# Patient Record
Sex: Female | Born: 1995
Health system: Southern US, Community
[De-identification: ages and names within clinical notes are randomized; demographics above are authoritative.]

## PROBLEM LIST (undated history)

## (undated) ENCOUNTER — Emergency Department (HOSPITAL_COMMUNITY): Admission: EM | Payer: BLUE CROSS/BLUE SHIELD | Source: Home / Self Care

## (undated) DIAGNOSIS — Z30017 Encounter for initial prescription of implantable subdermal contraceptive: Secondary | ICD-10-CM

## (undated) DIAGNOSIS — G43909 Migraine, unspecified, not intractable, without status migrainosus: Secondary | ICD-10-CM

## (undated) DIAGNOSIS — Z3009 Encounter for other general counseling and advice on contraception: Secondary | ICD-10-CM

## (undated) DIAGNOSIS — A749 Chlamydial infection, unspecified: Secondary | ICD-10-CM

## (undated) DIAGNOSIS — B9689 Other specified bacterial agents as the cause of diseases classified elsewhere: Secondary | ICD-10-CM

## (undated) DIAGNOSIS — N760 Acute vaginitis: Secondary | ICD-10-CM

## (undated) DIAGNOSIS — A549 Gonococcal infection, unspecified: Secondary | ICD-10-CM

## (undated) HISTORY — DX: Other specified bacterial agents as the cause of diseases classified elsewhere: B96.89

## (undated) HISTORY — DX: Chlamydial infection, unspecified: A74.9

## (undated) HISTORY — DX: Encounter for other general counseling and advice on contraception: Z30.09

## (undated) HISTORY — DX: Acute vaginitis: N76.0

## (undated) HISTORY — DX: Gonococcal infection, unspecified: A54.9

## (undated) HISTORY — DX: Migraine, unspecified, not intractable, without status migrainosus: G43.909

## (undated) HISTORY — DX: Encounter for initial prescription of implantable subdermal contraceptive: Z30.017

## (undated) HISTORY — PX: NO PAST SURGERIES: SHX2092

---
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
## 2014-01-17 ENCOUNTER — Ambulatory Visit: Payer: BC Managed Care – PPO | Admitting: Nurse Practitioner

## 2014-01-19 ENCOUNTER — Encounter (HOSPITAL_COMMUNITY): Payer: Self-pay | Admitting: Emergency Medicine

## 2014-01-19 ENCOUNTER — Emergency Department (HOSPITAL_COMMUNITY)
Admission: EM | Admit: 2014-01-19 | Discharge: 2014-01-20 | Disposition: A | Discharge status: Home / Self Care | Payer: BC Managed Care – PPO | Attending: Emergency Medicine | Admitting: Emergency Medicine

## 2014-01-19 DIAGNOSIS — N72 Inflammatory disease of cervix uteri: Secondary | ICD-10-CM | POA: Insufficient documentation

## 2014-01-19 DIAGNOSIS — N390 Urinary tract infection, site not specified: Secondary | ICD-10-CM | POA: Insufficient documentation

## 2014-01-19 DIAGNOSIS — Z3202 Encounter for pregnancy test, result negative: Secondary | ICD-10-CM | POA: Insufficient documentation

## 2014-01-19 DIAGNOSIS — B3731 Acute candidiasis of vulva and vagina: Secondary | ICD-10-CM | POA: Insufficient documentation

## 2014-01-19 DIAGNOSIS — B373 Candidiasis of vulva and vagina: Secondary | ICD-10-CM | POA: Insufficient documentation

## 2014-01-19 LAB — WET PREP, GENITAL
Clue Cells Wet Prep HPF POC: NONE SEEN
Trich, Wet Prep: NONE SEEN
Yeast Wet Prep HPF POC: NONE SEEN

## 2014-01-19 LAB — URINALYSIS, ROUTINE W REFLEX MICROSCOPIC
Bilirubin Urine: NEGATIVE
Glucose, UA: NEGATIVE mg/dL
HGB URINE DIPSTICK: NEGATIVE
Ketones, ur: NEGATIVE mg/dL
NITRITE: NEGATIVE
Protein, ur: NEGATIVE mg/dL
SPECIFIC GRAVITY, URINE: 1.029 (ref 1.005–1.030)
UROBILINOGEN UA: 1 mg/dL (ref 0.0–1.0)
pH: 5.5 (ref 5.0–8.0)

## 2014-01-19 LAB — URINE MICROSCOPIC-ADD ON

## 2014-01-19 LAB — POC URINE PREG, ED: Preg Test, Ur: NEGATIVE

## 2014-01-19 MED ORDER — FLUCONAZOLE 150 MG PO TABS
150.0000 mg | ORAL_TABLET | Freq: Once | ORAL | Status: AC
  Administered 2014-01-19: 150 mg via ORAL
  Filled 2014-01-19: qty 1

## 2014-01-19 MED ORDER — AZITHROMYCIN 250 MG PO TABS
1000.0000 mg | ORAL_TABLET | Freq: Once | ORAL | Status: AC
  Administered 2014-01-19: 1000 mg via ORAL
  Filled 2014-01-19: qty 4

## 2014-01-19 MED ORDER — CEFTRIAXONE SODIUM 250 MG IJ SOLR
250.0000 mg | Freq: Once | INTRAMUSCULAR | Status: AC
  Administered 2014-01-19: 250 mg via INTRAMUSCULAR
  Filled 2014-01-19: qty 250

## 2014-01-19 NOTE — ED Provider Notes (Signed)
CSN: [id number]     Arrival date & time 01/19/14  2042 History   First MD Initiated Contact with Patient 01/19/14 2236     Chief Complaint  Patient presents with  . Vaginal Itching     (Consider location/radiation/quality/duration/timing/severity/associated sxs/prior Treatment) HPI Lauren Sherman is a 18 y.o. female who presents to ED with complaint of vaginal itching. States itching began  2 wks ago. States just prior to that had bacterial vaginosis and was treated with an antibiotic. States having vulva irritation, itching, white discharge. States tried [employer]  "eggs" with no relief. Denies urinary symptoms. She is sexually active with one partner. Denies fever, chills, abdominal pain, back pain.   History reviewed. No pertinent past medical history. History reviewed. No pertinent past surgical history. Family History  Problem Relation Age of Onset  . Cancer Other    History  Substance Use Topics  . Smoking status: Never Smoker   . Smokeless tobacco: Not on file  . Alcohol Use: No   OB History   Grav Para Term Preterm Abortions TAB SAB Ect Mult Living                 Review of Systems  Constitutional: Negative for fever and chills.  Respiratory: Negative for cough, chest tightness and shortness of breath.   Cardiovascular: Negative for chest pain, palpitations and leg swelling.  Gastrointestinal: Negative for nausea, vomiting, abdominal pain and diarrhea.  Genitourinary: Positive for vaginal discharge. Negative for dysuria, flank pain, vaginal bleeding, vaginal pain and pelvic pain.  Musculoskeletal: Negative for arthralgias, myalgias, neck pain and neck stiffness.  Skin: Negative for rash.  Neurological: Negative for dizziness, weakness and headaches.  All other systems reviewed and are negative.      Allergies  Cherry and Peanut-containing drug products  Home Medications   Current Outpatient Rx  Name  Route  Sig  Dispense  Refill  . albuterol (PROVENTIL  HFA;VENTOLIN HFA) 108 (90 BASE) MCG/ACT inhaler   Inhalation   Inhale 2 puffs into the lungs every 6 (six) hours as needed for wheezing.   1 Inhaler   2    BP 129/78  Pulse 67  Temp(Src) 98.9 F (37.2 C) (Oral)  Resp 16  Ht 5\' 3"  (1.6 m)  Wt 170 lb 5 oz (77.253 kg)  BMI 30.18 kg/m2  SpO2 98%  LMP 12/22/2013 Physical Exam  Nursing note and vitals reviewed. Constitutional: She appears well-developed and well-nourished. No distress.  HENT:  Head: Normocephalic.  Eyes: Conjunctivae are normal.  Neck: Neck supple.  Cardiovascular: Normal rate, regular rhythm and normal heart sounds.   Pulmonary/Chest: Effort normal and breath sounds normal. No respiratory distress. She has no wheezes. She has no rales.  Abdominal: Soft. Bowel sounds are normal. She exhibits no distension. There is no tenderness. There is no rebound.  No CVA tenderness  Genitourinary: Vaginal discharge found.  Vulva irritated, vaginal canal erythematous, tender, white thick cottage cheese discharge present. Normal cervix, no CMT, no uterine or adnexal tenderness.   Musculoskeletal: She exhibits no edema.  Neurological: She is alert.  Skin: Skin is warm and dry.  Psychiatric: She has a normal mood and affect. Her behavior is normal.    ED Course  Procedures (including critical care time) Labs Review Labs Reviewed  WET PREP, GENITAL - Abnormal; Notable for the following:    WBC, Wet Prep HPF POC TOO NUMEROUS TO COUNT (*)    All other components within normal limits  URINALYSIS, ROUTINE W REFLEX  MICROSCOPIC - Abnormal; Notable for the following:    APPearance CLOUDY (*)    Leukocytes, UA LARGE (*)    All other components within normal limits  URINE MICROSCOPIC-ADD ON - Abnormal; Notable for the following:    Squamous Epithelial / LPF MANY (*)    Bacteria, UA MANY (*)    All other components within normal limits  GC/CHLAMYDIA PROBE AMP  POC URINE PREG, ED   Imaging Review No results found.   EKG  Interpretation None      MDM   Final diagnoses:  Cervicitis  Candida vaginitis  UTI (lower urinary tract infection)   Pt with vaginal discharge and itching. She is sexually active. Recent antibitoic. Will get pelvic exam done, wet prep, gc chlamydia, ua.    11:59 PM Patient's exam is consistent with candida infection. Her wet prep however shows too numerous to count white blood cells. I discussed this with her and her mother. Will treat in emergency department for possible STD. Cultures percent. She received 250 mg of Rocephin IM, 1 g of azithromycin. Have also given her a Diflucan, 150 mg tablet by mouth for clinical yeast infection. Urine is contaminated but most likely infection as well. Will treat with antibiotics. Patient is instructed to followup for recheck. She is otherwise nontoxic appearing, afebrile, normal vital signs, no signs of PID or pyelonephritis.   Filed Vitals:   01/19/14 2120 01/19/14 2143 01/19/14 2155  BP: 129/78    Pulse: 67    Temp: 98.9 F (37.2 C)    TempSrc: Oral    Resp: 16    Height:  5\' 3"  (1.6 m)   Weight:  170 lb (77.111 kg) 170 lb 5 oz (77.253 kg)  SpO2: 98%       Lauren Sciarra A Leylany Nored, PA-C 01/20/14 0004

## 2014-01-19 NOTE — ED Notes (Addendum)
Pt c/o vaginal itching x 2 weeks., white discharge, denies odor. Denies abd pain. Pt did use Monostat OTC treatment without relief

## 2014-01-20 LAB — GC/CHLAMYDIA PROBE AMP
CT PROBE, AMP APTIMA: NEGATIVE
GC PROBE AMP APTIMA: NEGATIVE

## 2014-01-20 MED ORDER — FLUCONAZOLE 150 MG PO TABS: 150.0000 mg | ORAL_TABLET | Freq: Every day | ORAL | Status: DC

## 2014-01-20 MED ORDER — CEPHALEXIN 250 MG PO CAPS: 250.0000 mg | ORAL_CAPSULE | Freq: Four times a day (QID) | ORAL | Status: DC

## 2014-01-20 MED ORDER — BENZOCAINE-RESORCINOL 5-2 % VA CREA: TOPICAL_CREAM | Freq: Every day | VAGINAL | Status: DC

## 2014-01-20 NOTE — Discharge Instructions (Signed)
Take keflex as prescribed until all gone for uti. Take diflucan after finish all antibiotics. Vagisil cream as prescribed. Cultures of vaginal swabs are pending and will return in 2-3 days, if abnormal, you will be called. Follow up with primary care doctor.    Candidal Vulvovaginitis Candidal vulvovaginitis is an infection of the vagina and vulva. The vulva is the skin around the opening of the vagina. This may cause itching and discomfort in and around the vagina.  HOME CARE  Only take medicine as told by your doctor.  Do not have sex (intercourse) until the infection is healed or as told by your doctor.  Practice safe sex.  Tell your sex partner about your infection.  Do not douche or use tampons.  Wear cotton underwear. Do not wear tight pants or panty hose.  Eat yogurt. This may help treat and prevent yeast infections. GET HELP RIGHT AWAY IF:   You have a fever.  Your problems get worse during treatment or do not get better in 3 days.  You have discomfort, irritation, or itching in your vagina or vulva area.  You have pain after sex.  You start to get belly (abdominal) pain. MAKE SURE YOU:  Understand these instructions.  Will watch your condition.  Will get help right away if you are not doing well or get worse. Document Released: 01/15/2009 Document Revised: 01/11/2012 Document Reviewed: 01/15/2009 [facility] Patient Information 2014 [place], [place].

## 2014-01-21 NOTE — ED Provider Notes (Signed)
Medical screening examination/treatment/procedure(s) were performed by non-physician practitioner and as supervising physician I was immediately available for consultation/collaboration.   EKG Interpretation None        Eliyanah Elgersma E Olie Scaffidi, MD 01/21/14 1607 

## 2014-01-22 ENCOUNTER — Ambulatory Visit: Payer: BC Managed Care – PPO | Admitting: Family Medicine

## 2014-02-23 ENCOUNTER — Telehealth: Payer: Self-pay | Admitting: *Deleted

## 2014-02-23 NOTE — Telephone Encounter (Signed)
Any urinary symptoms? Is this different from her last vaginitis? Sounds more like BV than yeast. Itching or burning?

## 2014-02-23 NOTE — Telephone Encounter (Signed)
[facility]

## 2014-02-23 NOTE — Telephone Encounter (Signed)
Pt seen at ED on 01/19/14. Dx cervicitis, candidal vulvovaginitis, UTI.  5 days ago pt stated having a strong vaginal odor, discharge sometimes clear, sometimes yellow. No pelvic or abd pain, no fever.

## 2014-02-26 ENCOUNTER — Other Ambulatory Visit: Payer: Self-pay | Admitting: Nurse Practitioner

## 2014-02-26 MED ORDER — METRONIDAZOLE 500 MG PO TABS: 500.0000 mg | ORAL_TABLET | Freq: Two times a day (BID) | ORAL | Status: DC

## 2014-02-26 NOTE — Telephone Encounter (Signed)
Med sent in for BV. Office visit if persists. Also let [place] know if she needs birth control. No depo provera since dec.

## 2014-02-26 NOTE — Telephone Encounter (Signed)
Patient denies and urinary symptoms. She said it is the same as her last bacterial vaginitis. She has a bad odor and white discharge. No itching/burning.

## 2014-02-27 ENCOUNTER — Other Ambulatory Visit: Payer: Self-pay | Admitting: Nurse Practitioner

## 2014-02-27 MED ORDER — NORETHINDRONE ACET-ETHINYL EST 1.5-30 MG-MCG PO TABS: 1.0000 | ORAL_TABLET | Freq: Every day | ORAL | Status: DC

## 2014-02-27 NOTE — Telephone Encounter (Signed)
Will send in Rx. Start first Sunday after next period begins. Call back if any problems

## 2014-02-27 NOTE — Telephone Encounter (Signed)
Patient notified and verbalized understanding. 

## 2014-02-27 NOTE — Telephone Encounter (Signed)
Patient is not on birth control. She would like to try [employer] pills.

## 2014-03-06 ENCOUNTER — Telehealth: Payer: Self-pay | Admitting: Family Medicine

## 2014-03-06 NOTE — Telephone Encounter (Signed)
Rx prior auth obtained for pt's JUNEL 1.5/30, expires 03/02/15 through [employer], [facility]

## 2014-03-15 ENCOUNTER — Other Ambulatory Visit: Payer: Self-pay | Admitting: Nurse Practitioner

## 2014-03-15 ENCOUNTER — Telehealth: Payer: Self-pay | Admitting: Nurse Practitioner

## 2014-03-15 MED ORDER — TERCONAZOLE 0.4 % VA CREA: 1.0000 | TOPICAL_CREAM | Freq: Every day | VAGINAL | Status: DC

## 2014-03-15 MED ORDER — FLUCONAZOLE 150 MG PO TABS: ORAL_TABLET | ORAL | Status: DC

## 2014-03-15 NOTE — Telephone Encounter (Signed)
Yes. Will send in Rx as requested.

## 2014-03-15 NOTE — Telephone Encounter (Signed)
Discussed with patient

## 2014-03-15 NOTE — Telephone Encounter (Signed)
Pt has another yeast infection an wants to know if we can call her in  Some cream an diflucan again?   [facility]

## 2014-05-28 ENCOUNTER — Encounter: Payer: Self-pay | Admitting: Adult Health

## 2014-05-28 ENCOUNTER — Ambulatory Visit (INDEPENDENT_AMBULATORY_CARE_PROVIDER_SITE_OTHER): Payer: BC Managed Care – PPO | Admitting: Adult Health

## 2014-05-28 DIAGNOSIS — Z3202 Encounter for pregnancy test, result negative: Secondary | ICD-10-CM

## 2014-05-28 DIAGNOSIS — Z3200 Encounter for pregnancy test, result unknown: Secondary | ICD-10-CM

## 2014-05-28 LAB — POCT URINE PREGNANCY: PREG TEST UR: NEGATIVE

## 2014-05-29 ENCOUNTER — Telehealth: Payer: Self-pay | Admitting: Adult Health

## 2014-05-29 LAB — HCG, QUANTITATIVE, PREGNANCY

## 2014-05-29 NOTE — Telephone Encounter (Signed)
Pt informed of negative blood pregnancy test. 

## 2014-05-31 ENCOUNTER — Ambulatory Visit: Payer: BC Managed Care – PPO | Admitting: Nurse Practitioner

## 2014-06-17 ENCOUNTER — Emergency Department (HOSPITAL_COMMUNITY)
Admission: EM | Admit: 2014-06-17 | Discharge: 2014-06-17 | Disposition: A | Discharge status: Home / Self Care | Payer: Worker's Compensation | Attending: Emergency Medicine | Admitting: Emergency Medicine

## 2014-06-17 ENCOUNTER — Emergency Department (HOSPITAL_COMMUNITY): Payer: Worker's Compensation

## 2014-06-17 ENCOUNTER — Encounter (HOSPITAL_COMMUNITY): Payer: Self-pay | Admitting: Emergency Medicine

## 2014-06-17 DIAGNOSIS — Y990 Civilian activity done for income or pay: Secondary | ICD-10-CM | POA: Diagnosis not present

## 2014-06-17 DIAGNOSIS — Y9289 Other specified places as the place of occurrence of the external cause: Secondary | ICD-10-CM | POA: Diagnosis not present

## 2014-06-17 DIAGNOSIS — S8990XA Unspecified injury of unspecified lower leg, initial encounter: Secondary | ICD-10-CM | POA: Insufficient documentation

## 2014-06-17 DIAGNOSIS — S93409A Sprain of unspecified ligament of unspecified ankle, initial encounter: Secondary | ICD-10-CM | POA: Diagnosis not present

## 2014-06-17 DIAGNOSIS — S99929A Unspecified injury of unspecified foot, initial encounter: Secondary | ICD-10-CM

## 2014-06-17 DIAGNOSIS — W101XXA Fall (on)(from) sidewalk curb, initial encounter: Secondary | ICD-10-CM | POA: Diagnosis not present

## 2014-06-17 DIAGNOSIS — Y9389 Activity, other specified: Secondary | ICD-10-CM | POA: Insufficient documentation

## 2014-06-17 DIAGNOSIS — S99919A Unspecified injury of unspecified ankle, initial encounter: Secondary | ICD-10-CM

## 2014-06-17 DIAGNOSIS — S93401A Sprain of unspecified ligament of right ankle, initial encounter: Secondary | ICD-10-CM

## 2014-06-17 MED ORDER — IBUPROFEN 800 MG PO TABS
800.0000 mg | ORAL_TABLET | Freq: Once | ORAL | Status: AC
  Administered 2014-06-17: 800 mg via ORAL
  Filled 2014-06-17: qty 1

## 2014-06-17 MED ORDER — ACETAMINOPHEN 325 MG PO TABS
650.0000 mg | ORAL_TABLET | Freq: Once | ORAL | Status: AC
  Administered 2014-06-17: 650 mg via ORAL
  Filled 2014-06-17: qty 2

## 2014-06-17 NOTE — Discharge Instructions (Signed)
Please ice to your ankle. Please use the ankle splint for the next 7 days. Please elevate her ankle above your waist. Use 600 mg of ibuprofen and 500 mg of Tylenol every 6 hours if needed for pain. Please see the orthopedic specialist listed above if not improving. Ankle Sprain An ankle sprain is an injury to the strong, fibrous tissues (ligaments) that hold the bones of your ankle joint together.  CAUSES An ankle sprain is usually caused by a fall or by twisting your ankle. Ankle sprains most commonly occur when you step on the outer edge of your foot, and your ankle turns inward. People who participate in sports are more prone to these types of injuries.  SYMPTOMS   Pain in your ankle. The pain may be present at rest or only when you are trying to stand or walk.  Swelling.  Bruising. Bruising may develop immediately or within 1 to 2 days after your injury.  Difficulty standing or walking, particularly when turning corners or changing directions. DIAGNOSIS  Your caregiver will ask you details about your injury and perform a physical exam of your ankle to determine if you have an ankle sprain. During the physical exam, your caregiver will press on and apply pressure to specific areas of your foot and ankle. Your caregiver will try to move your ankle in certain ways. An X-ray exam may be done to be sure a bone was not broken or a ligament did not separate from one of the bones in your ankle (avulsion fracture).  TREATMENT  Certain types of braces can help stabilize your ankle. Your caregiver can make a recommendation for this. Your caregiver may recommend the use of medicine for pain. If your sprain is severe, your caregiver may refer you to a surgeon who helps to restore function to parts of your skeletal system (orthopedist) or a physical therapist. HOME CARE INSTRUCTIONS   Apply ice to your injury for 1-2 days or as directed by your caregiver. Applying ice helps to reduce inflammation and  pain.  Put ice in a plastic bag.  Place a towel between your skin and the bag.  Leave the ice on for 15-20 minutes at a time, every 2 hours while you are awake.  Only take over-the-counter or prescription medicines for pain, discomfort, or fever as directed by your caregiver.  Elevate your injured ankle above the level of your heart as much as possible for 2-3 days.  If your caregiver recommends crutches, use them as instructed. Gradually put weight on the affected ankle. Continue to use crutches or a cane until you can walk without feeling pain in your ankle.  If you have a plaster splint, wear the splint as directed by your caregiver. Do not rest it on anything harder than a pillow for the first 24 hours. Do not put weight on it. Do not get it wet. You may take it off to take a shower or bath.  You may have been given an elastic bandage to wear around your ankle to provide support. If the elastic bandage is too tight (you have numbness or tingling in your foot or your foot becomes cold and blue), adjust the bandage to make it comfortable.  If you have an air splint, you may blow more air into it or let air out to make it more comfortable. You may take your splint off at night and before taking a shower or bath. Wiggle your toes in the splint several times per day  to decrease swelling. SEEK MEDICAL CARE IF:   You have rapidly increasing bruising or swelling.  Your toes feel extremely cold or you lose feeling in your foot.  Your pain is not relieved with medicine. SEEK IMMEDIATE MEDICAL CARE IF:  Your toes are numb or blue.  You have severe pain that is increasing. MAKE SURE YOU:   Understand these instructions.  Will watch your condition.  Will get help right away if you are not doing well or get worse. Document Released: 10/19/2005 Document Revised: 07/13/2012 Document Reviewed: 10/31/2011 [facility]Exit[facility] Patient Information 2015 [place] ViewExitCare, MarylandLLC. This information is not intended to  replace advice given to you by your health care provider. Make sure you discuss any questions you have with your health care provider.

## 2014-06-17 NOTE — ED Notes (Signed)
Not able to put weight on right ankle per pt

## 2014-06-17 NOTE — ED Notes (Signed)
Pt had right ankle roll and pt fell, c/o right ankle pain, denies hitting head

## 2014-06-17 NOTE — ED Notes (Signed)
Pt transported to lab for UDS

## 2014-06-17 NOTE — ED Provider Notes (Signed)
CSN: [id number]     Arrival date & time 06/17/14  1211 History  This chart was scribed for non-physician practitioner, Ivery Quale, PA-C,working with Ward Givens, MD, by Karle Plumber, ED Scribe. This patient was seen in room APFT20/APFT20 and the patient's care was started at 1:04 PM.  Chief Complaint  Patient presents with  . Fall   Patient is a 18 y.o. female presenting with fall. The history is provided by the patient. No language interpreter was used.  Fall This is a new problem.  Fall This is a new problem. Associated symptoms include arthralgias.   HPI Comments:  Lauren Sherman is a 18 y.o. female who presents to the Emergency Department complaining of severe right ankle pain that occurred approximately two hours ago secondary to rolling the ankle at work. She states she stepped off of a curb the wrong way. Pt states she can only bear minimal weight. She reports multiple past ankle injuries bilaterally. She denies any previous surgery to the ankle.  History reviewed. No pertinent past medical history. History reviewed. No pertinent past surgical history. Family History  Problem Relation Age of Onset  . Cancer Other   . Cancer Paternal Grandmother     breast   History  Substance Use Topics  . Smoking status: Never Smoker   . Smokeless tobacco: Never Used  . Alcohol Use: Yes     Comment: occ   OB History   Grav Para Term Preterm Abortions TAB SAB Ect Mult Living                 Review of Systems  Musculoskeletal: Positive for arthralgias.  Neurological: Negative for syncope.  All other systems reviewed and are negative.   Allergies  Cherry and Peanut-containing drug products  Home Medications   Prior to Admission medications   Medication Sig Start Date End Date Taking? Authorizing Provider  albuterol (PROVENTIL HFA;VENTOLIN HFA) 108 (90 BASE) MCG/ACT inhaler Inhale 2 puffs into the lungs every 6 (six) hours as needed for wheezing. 07/06/13   Babs Sciara, MD   benzocaine-resorcinol (VAGISIL) 5-2 % vaginal cream Place vaginally at bedtime. 01/20/14   Tatyana A Kirichenko, PA-C  terconazole (TERAZOL 7) 0.4 % vaginal cream Place 1 applicator vaginally at bedtime. Prn yeast infection 03/15/14   Campbell Riches, NP   Triage Vitals: BP 113/67  Pulse 68  Temp(Src) 98.5 F (36.9 C) (Oral)  Resp 16  Ht 5\' 3"  (1.6 m)  Wt 178 lb (80.74 kg)  BMI 31.54 kg/m2  SpO2 100%  LMP 06/05/2014 Physical Exam  Nursing note and vitals reviewed. Constitutional: She is oriented to person, place, and time. She appears well-developed and well-nourished.  HENT:  Head: Normocephalic and atraumatic.  Eyes: EOM are normal.  Neck: Normal range of motion.  Cardiovascular: Normal rate.   Pulmonary/Chest: Effort normal.  Musculoskeletal: Normal range of motion. She exhibits edema and tenderness.  Full ROM of right hip and knee. Tender to palpation of lateral malleolus. Moderate swelling without bruising. Achilles tendon intact. No lesions between toes. No puncture wound over plantar surface.  Neurological: She is alert and oriented to person, place, and time.  Skin: Skin is warm and dry.  Psychiatric: She has a normal mood and affect. Her behavior is normal.    ED Course  Procedures (including critical care time) DIAGNOSTIC STUDIES: Oxygen Saturation is 100% on RA, normal by my interpretation.   COORDINATION OF CARE: 1:08 PM- Will order splint and crutches. Advised pt  to RICE ankle. Pt verbalizes understanding and agrees to plan.  Medications - No data to display  Labs Review Labs Reviewed - No data to display  Imaging Review Dg Ankle Complete Right  06/17/2014   CLINICAL DATA:  Lateral ankle pain post twist injury and fall on sidewalk today 06/15/2007  EXAM: RIGHT ANKLE - COMPLETE 3+ VIEW  COMPARISON:  None.  FINDINGS: Osseous mineralization normal.  Joint spaces preserved.  No fracture, dislocation, or bone destruction.  IMPRESSION: Normal exam.    Electronically Signed   By: Ulyses SouthwardMark  Boles M.D.   On: 06/17/2014 12:40     EKG Interpretation None      MDM Vital signs are well within normal limits. Pulse oximetry is 100% on room air. Within normal limits by my interpretation. X-ray of the right ankle is read as normal by radiology. No fracture, dislocation or bone destruction appreciated.  The plan at this time is for the patient to use ice, elevation, and ankle stirrup splint. Patient fitted with crutches. Patient will use Tylenol and ibuprofen for soreness. Patient is to followup with orthopedics if not improving.    Final diagnoses:  None    *I have reviewed nursing notes, vital signs, and all appropriate lab and imaging results for this patient.**  I personally performed the services described in this documentation, which was scribed in my presence. The recorded information has been reviewed and is accurate.    Kathie DikeHobson M Humberto Addo, PA-C 06/17/14 1721

## 2014-06-18 NOTE — ED Provider Notes (Signed)
Medical screening examination/treatment/procedure(s) were performed by non-physician practitioner and as supervising physician I was immediately available for consultation/collaboration.   EKG Interpretation None      Devoria AlbeIva Lyah Millirons, MD, Armando GangFACEP   Ward GivensIva L Mathea Frieling, MD 06/18/14 [phone]

## 2014-10-30 ENCOUNTER — Emergency Department (HOSPITAL_COMMUNITY)
Admission: EM | Admit: 2014-10-30 | Discharge: 2014-10-30 | Disposition: A | Discharge status: Home / Self Care | Payer: BC Managed Care – PPO

## 2014-10-30 NOTE — ED Notes (Signed)
No answer

## 2014-12-01 ENCOUNTER — Encounter (HOSPITAL_COMMUNITY): Payer: Self-pay | Admitting: Emergency Medicine

## 2014-12-01 ENCOUNTER — Emergency Department (HOSPITAL_COMMUNITY)
Admission: EM | Admit: 2014-12-01 | Discharge: 2014-12-01 | Disposition: A | Discharge status: Home / Self Care | Payer: BLUE CROSS/BLUE SHIELD | Attending: Emergency Medicine | Admitting: Emergency Medicine

## 2014-12-01 DIAGNOSIS — Y9389 Activity, other specified: Secondary | ICD-10-CM | POA: Diagnosis not present

## 2014-12-01 DIAGNOSIS — W231XXA Caught, crushed, jammed, or pinched between stationary objects, initial encounter: Secondary | ICD-10-CM | POA: Insufficient documentation

## 2014-12-01 DIAGNOSIS — Y998 Other external cause status: Secondary | ICD-10-CM | POA: Insufficient documentation

## 2014-12-01 DIAGNOSIS — N760 Acute vaginitis: Secondary | ICD-10-CM | POA: Insufficient documentation

## 2014-12-01 DIAGNOSIS — Z79899 Other long term (current) drug therapy: Secondary | ICD-10-CM | POA: Insufficient documentation

## 2014-12-01 DIAGNOSIS — S61219A Laceration without foreign body of unspecified finger without damage to nail, initial encounter: Secondary | ICD-10-CM

## 2014-12-01 DIAGNOSIS — Y9289 Other specified places as the place of occurrence of the external cause: Secondary | ICD-10-CM | POA: Insufficient documentation

## 2014-12-01 DIAGNOSIS — B9689 Other specified bacterial agents as the cause of diseases classified elsewhere: Secondary | ICD-10-CM

## 2014-12-01 DIAGNOSIS — N898 Other specified noninflammatory disorders of vagina: Secondary | ICD-10-CM | POA: Diagnosis present

## 2014-12-01 DIAGNOSIS — S61214A Laceration without foreign body of right ring finger without damage to nail, initial encounter: Secondary | ICD-10-CM | POA: Diagnosis not present

## 2014-12-01 LAB — URINALYSIS, ROUTINE W REFLEX MICROSCOPIC
Bilirubin Urine: NEGATIVE
Glucose, UA: NEGATIVE mg/dL
KETONES UR: NEGATIVE mg/dL
LEUKOCYTES UA: NEGATIVE
Nitrite: NEGATIVE
Protein, ur: NEGATIVE mg/dL
Specific Gravity, Urine: >1.03 — ABNORMAL HIGH (ref 1.005–1.030)
Urobilinogen, UA: 1 mg/dL (ref 0.0–1.0)
pH: 5.5 (ref 5.0–8.0)

## 2014-12-01 LAB — URINE MICROSCOPIC-ADD ON

## 2014-12-01 LAB — WET PREP, GENITAL
TRICH WET PREP: NONE SEEN
YEAST WET PREP: NONE SEEN

## 2014-12-01 LAB — POC URINE PREG, ED: PREG TEST UR: NEGATIVE

## 2014-12-01 MED ORDER — POVIDONE-IODINE 10 % EX SOLN
CUTANEOUS | Status: AC
  Administered 2014-12-01: 09:00:00
  Filled 2014-12-01: qty 118

## 2014-12-01 MED ORDER — BACITRACIN-NEOMYCIN-POLYMYXIN 400-5-5000 EX OINT
TOPICAL_OINTMENT | Freq: Once | CUTANEOUS | Status: AC
  Administered 2014-12-01: 1 via TOPICAL
  Filled 2014-12-01: qty 1

## 2014-12-01 MED ORDER — METRONIDAZOLE 500 MG PO TABS: 500.0000 mg | ORAL_TABLET | Freq: Two times a day (BID) | ORAL | Status: DC

## 2014-12-01 NOTE — ED Notes (Signed)
Pt reports vaginal discharge/odor x1 month. Pt also reports caught right index finger on a door prior to arrival. nad noted. Pt denies any v/d/abdominal pain.

## 2014-12-01 NOTE — ED Provider Notes (Signed)
CSN: [id number]     Arrival date & time 12/01/14  0803 History   First MD Initiated Contact with Patient 12/01/14 0805     Chief Complaint  Patient presents with  . Vaginal Discharge     (Consider location/radiation/quality/duration/timing/severity/associated sxs/prior Treatment) Patient is a 19 y.o. female presenting with vaginal discharge. The history is provided by the patient.  Vaginal Discharge Quality:  Malodorous and yellow Severity:  Moderate Onset quality:  Gradual Duration:  1 month Timing:  Constant Progression:  Worsening Chronicity:  New Context: spontaneously   Relieved by:  Nothing Worsened by:  Nothing tried Ineffective treatments:  None tried Associated symptoms: dyspareunia and urinary frequency   Associated symptoms: no abdominal pain, no dysuria, no fever, no genital lesions, no nausea, no rash, no urinary hesitancy, no urinary incontinence, no vaginal itching and no vomiting   Risk factors: unprotected sex   Risk factors: no gynecological surgery, no new sexual partner, no PID, no prior miscarriage, no STI and no STI exposure    Lauren Sherman is a 19 y.o. G0 and is not pregnant who presents to the ED for vaginal d/c that has been going on for a month. She complains of bad odor. Menses currently. No hx of STI's, current sex partner >2 years. Unprotected sex, no birth control.  Patient also complains of laceration to the right index finger at the tip. She states the tip of her finger got pinched in a door on her way here.   History reviewed. No pertinent past medical history. History reviewed. No pertinent past surgical history. Family History  Problem Relation Age of Onset  . Cancer Other   . Cancer Paternal Grandmother     breast   History  Substance Use Topics  . Smoking status: Never Smoker   . Smokeless tobacco: Never Used  . Alcohol Use: Yes     Comment: occ   OB History    No data available     Review of Systems  Constitutional: Negative  for fever.  HENT: Negative.   Eyes: Negative for pain, redness, itching and visual disturbance.  Respiratory: Negative for cough and wheezing.   Cardiovascular: Negative for chest pain, palpitations and leg swelling.  Gastrointestinal: Negative for nausea, vomiting, abdominal pain, diarrhea and constipation.  Genitourinary: Positive for urgency, vaginal discharge and dyspareunia. Negative for bladder incontinence, dysuria, hesitancy and frequency.  Musculoskeletal: Negative for back pain and neck pain.       Laceration right index finger, tip  Skin: Negative for rash.  Neurological: Negative for syncope, light-headedness and headaches.  Psychiatric/Behavioral: Negative for confusion. The patient is not nervous/anxious.   all other systems negative    Allergies  Cherry and Peanut-containing drug products  Home Medications   Prior to Admission medications   Medication Sig Start Date End Date Taking? Authorizing Provider  albuterol (PROVENTIL HFA;VENTOLIN HFA) 108 (90 BASE) MCG/ACT inhaler Inhale 2 puffs into the lungs every 6 (six) hours as needed for wheezing. Patient not taking: Reported on 12/01/2014 07/06/13   Babs SciaraScott A Luking, MD  metroNIDAZOLE (FLAGYL) 500 MG tablet Take 1 tablet (500 mg total) by mouth 2 (two) times daily. 12/01/14   Hope Orlene OchM Neese, NP   BP 117/69 mmHg  Pulse 77  Temp(Src) 98.3 F (36.8 C) (Oral)  Resp 16  Ht 5\' 3"  (1.6 m)  Wt 180 lb (81.647 kg)  BMI 31.89 kg/m2  SpO2 100%  LMP 12/01/2014 Physical Exam  Constitutional: She is oriented to person, place, and  time. She appears well-developed and well-nourished.  HENT:  Head: Normocephalic.  Eyes: Conjunctivae and EOM are normal.  Neck: Neck supple.  Cardiovascular: Normal rate and regular rhythm.   Pulmonary/Chest: Effort normal. She has no wheezes. She has no rales.  Abdominal: Soft. There is no tenderness.  Genitourinary:  External genitalia without lesions, moderate blood vaginal vault. No CMT, no adnexal  tenderness or mass palpated. Uterus without palpable enlargement.   Musculoskeletal: Normal range of motion.       Hands: Tiny flap laceration to the tip of the right index finger. Full range of motion, good sensation, adequate circulation.   Neurological: She is alert and oriented to person, place, and time. No cranial nerve deficit.  Skin: Skin is warm and dry.  Psychiatric: She has a normal mood and affect. Her behavior is normal.  Nursing note and vitals reviewed.   ED Course  Procedures Results for orders placed or performed during the hospital encounter of 12/01/14 (from the past 24 hour(s))  Urinalysis, Routine w reflex microscopic     Status: Abnormal   Collection Time: 12/01/14  8:20 AM  Result Value Ref Range   Color, Urine YELLOW YELLOW   APPearance CLEAR CLEAR   Specific Gravity, Urine >1.030 (H) 1.005 - 1.030   pH 5.5 5.0 - 8.0   Glucose, UA NEGATIVE NEGATIVE mg/dL   Hgb urine dipstick LARGE (A) NEGATIVE   Bilirubin Urine NEGATIVE NEGATIVE   Ketones, ur NEGATIVE NEGATIVE mg/dL   Protein, ur NEGATIVE NEGATIVE mg/dL   Urobilinogen, UA 1.0 0.0 - 1.0 mg/dL   Nitrite NEGATIVE NEGATIVE   Leukocytes, UA NEGATIVE NEGATIVE  Urine microscopic-add on     Status: Abnormal   Collection Time: 12/01/14  8:20 AM  Result Value Ref Range   Squamous Epithelial / LPF FEW (A) RARE   WBC, UA 3-6 <3 WBC/hpf   RBC / HPF 3-6 <3 RBC/hpf   Bacteria, UA FEW (A) RARE  POC urine preg, ED (not at [facility])     Status: None   Collection Time: 12/01/14  8:21 AM  Result Value Ref Range   Preg Test, Ur NEGATIVE NEGATIVE  Wet prep, genital     Status: Abnormal   Collection Time: 12/01/14  8:50 AM  Result Value Ref Range   Yeast Wet Prep HPF POC NONE SEEN NONE SEEN   Trich, Wet Prep NONE SEEN NONE SEEN   Clue Cells Wet Prep HPF POC FEW (A) NONE SEEN   WBC, Wet Prep HPF POC FEW (A) NONE SEEN    MDM  19 y.o. female with vaginal d/c x one month. Will treat for patient symptoms of BV. Stable for d/c  without signs or symptoms of PID.  Laceration to the right index finger cleaned with betadine and NSS, bacitracin ointment and dressing applied. Remains neurovascularly intact. Patient UTD with tetanus. Discussed with the patient clinical and lab findings and plan of care. All questioned fully answered. She will return if any problems arise.   Final diagnoses:  Bacterial vaginosis  Laceration of finger, right, initial encounter  NEESE,HOPE 12/01/2014       [facility], NP 12/01/14 1127  Benny Lennert, MD 12/01/14 [phone]

## 2014-12-01 NOTE — ED Notes (Signed)
Patient has small avulsion to right finger tip. Per patient "pinched finger tip" in car door this morning. No active bleeding noted.

## 2014-12-01 NOTE — ED Notes (Signed)
Patient's finger soaked in normal saline and betadine. Neosporin and band-aid applied.

## 2014-12-03 LAB — GC/CHLAMYDIA PROBE AMP (~~LOC~~) NOT AT ARMC
CHLAMYDIA, DNA PROBE: NEGATIVE
NEISSERIA GONORRHEA: NEGATIVE

## 2015-04-02 ENCOUNTER — Emergency Department (HOSPITAL_COMMUNITY)
Admission: EM | Admit: 2015-04-02 | Discharge: 2015-04-02 | Disposition: A | Discharge status: Home / Self Care | Payer: BLUE CROSS/BLUE SHIELD | Attending: Emergency Medicine | Admitting: Emergency Medicine

## 2015-04-02 ENCOUNTER — Encounter (HOSPITAL_COMMUNITY): Payer: Self-pay | Admitting: Emergency Medicine

## 2015-04-02 ENCOUNTER — Emergency Department (HOSPITAL_COMMUNITY): Payer: BLUE CROSS/BLUE SHIELD

## 2015-04-02 DIAGNOSIS — Z79899 Other long term (current) drug therapy: Secondary | ICD-10-CM | POA: Diagnosis not present

## 2015-04-02 DIAGNOSIS — R220 Localized swelling, mass and lump, head: Secondary | ICD-10-CM

## 2015-04-02 DIAGNOSIS — J069 Acute upper respiratory infection, unspecified: Secondary | ICD-10-CM

## 2015-04-02 DIAGNOSIS — R05 Cough: Secondary | ICD-10-CM

## 2015-04-02 DIAGNOSIS — R059 Cough, unspecified: Secondary | ICD-10-CM

## 2015-04-02 LAB — RAPID STREP SCREEN (MED CTR MEBANE ONLY): Streptococcus, Group A Screen (Direct): NEGATIVE

## 2015-04-02 MED ORDER — PREDNISONE 20 MG PO TABS: 60.0000 mg | ORAL_TABLET | Freq: Every day | ORAL | Status: DC

## 2015-04-02 MED ORDER — DIPHENHYDRAMINE HCL 25 MG PO CAPS
50.0000 mg | ORAL_CAPSULE | Freq: Once | ORAL | Status: AC
  Administered 2015-04-02: 50 mg via ORAL
  Filled 2015-04-02: qty 2

## 2015-04-02 MED ORDER — PREDNISONE 50 MG PO TABS
60.0000 mg | ORAL_TABLET | Freq: Once | ORAL | Status: AC
  Administered 2015-04-02: 60 mg via ORAL
  Filled 2015-04-02 (×2): qty 1

## 2015-04-02 NOTE — ED Notes (Signed)
Pt c/o cold symptoms and upper lip started swelling about one hour ago.

## 2015-04-02 NOTE — ED Provider Notes (Signed)
TIME SEEN: 2:10 AM  CHIEF COMPLAINT: Nasal congestion, sore throat, cough, upper lip swelling  HPI: Pt is a 19 y.o. female with no significant past medical history who presents to the emergency department with complaints of 2 days of nasal congestion, sore throat, subjective fever, cough with yellow sputum production. Reports her significant other has similar symptoms. States tonight she took ibuprofen and then several hours later developed some swelling to the upper right lip. Denies any tongue swelling. No difficulty breathing or wheezing. No hives. No other new soaps, lotions, detergents, medications, foods, pet exposure. Has taken ibuprofen before without any difficulty. She did not take any medication for her lip swelling at home.  ROS: See HPI Constitutional: Subjective fever  Eyes: no drainage  ENT:  runny nose   Cardiovascular:  no chest pain  Resp: no SOB  GI: no vomiting or diarrhea GU: no dysuria Integumentary: no rash  Allergy: no hives  Musculoskeletal: no leg swelling  Neurological: no slurred speech ROS otherwise negative  PAST MEDICAL HISTORY/PAST SURGICAL HISTORY:  History reviewed. No pertinent past medical history.  MEDICATIONS:  Prior to Admission medications   Medication Sig Start Date End Date Taking? Authorizing Provider  albuterol (PROVENTIL HFA;VENTOLIN HFA) 108 (90 BASE) MCG/ACT inhaler Inhale 2 puffs into the lungs every 6 (six) hours as needed for wheezing. Patient not taking: Reported on 12/01/2014 07/06/13   Babs Sciara, MD  metroNIDAZOLE (FLAGYL) 500 MG tablet Take 1 tablet (500 mg total) by mouth 2 (two) times daily. 12/01/14   Hope Orlene Och, NP    ALLERGIES:  Allergies  Allergen Reactions  . Cherry   . Peanut-Containing Drug Products Itching    SOCIAL HISTORY:  History  Substance Use Topics  . Smoking status: Never Smoker   . Smokeless tobacco: Never Used  . Alcohol Use: Yes     Comment: occ    FAMILY HISTORY: Family History  Problem  Relation Age of Onset  . Cancer Other   . Cancer Paternal Grandmother     breast    EXAM: BP 121/65 mmHg  Pulse 83  Temp(Src) 98.4 F (36.9 C)  Resp 18  Ht  (1.6 m)  Wt 180 lb (81.647 kg)  BMI 31.89 kg/m2  SpO2 96%  LMP 04/02/2015 CONSTITUTIONAL: Alert and oriented and responds appropriately to questions. Well-appearing; well-nourished, nontoxic, in no distress HEAD: Normocephalic EYES: Conjunctivae clear, PERRL ENT: normal nose; no rhinorrhea; moist mucous membranes; swelling of the upper right lip without sign of trauma, no tongue swelling, tongue sits flat in the bottom of the mouth, patient does have some bilateral tonsillar hypertrophy with exudate noted on the right tonsil, no uvular deviation, no trismus or drooling, normal phonation, no stridor NECK: Supple, no meningismus, no LAD  CARD: RRR; S1 and S2 appreciated; no murmurs, no clicks, no rubs, no gallops RESP: Normal chest excursion without splinting or tachypnea; breath sounds clear and equal bilaterally; no wheezes, no rhonchi, no rales, no hypoxia or respiratory distress, speaking full sentences ABD/GI: Normal bowel sounds; non-distended; soft, non-tender, no rebound, no guarding, no peritoneal signs BACK:  The back appears normal and is non-tender to palpation, there is no CVA tenderness EXT: Normal ROM in all joints; non-tender to palpation; no edema; normal capillary refill; no cyanosis, no calf tenderness or swelling    SKIN: Normal color for age and race; warm, no hives NEURO: Moves all extremities equally, sensation to light touch intact diffusely, cranial nerves II through XII intact PSYCH: The patient's  mood and manner are appropriate. Grooming and personal hygiene are appropriate.  MEDICAL DECISION MAKING: Patient here with upper respiratory infectious symptoms. We'll send strep swab and obtain chest x-ray. Patient also having swelling of her upper lip with no other sign of allergic reaction. We'll give  Benadryl and prednisone and monitor.  ED PROGRESS: Strep test is negative, chest x-ray clear. Upper lip swelling is improving. Have advised her to use Benadryl at home. We'll discharge on steroids for the next several days. Advised her to use Tylenol for fever and pain and avoid ibuprofen at this time. Discussed with her that this is likely a viral illness causing her symptoms and I do not feel she needs any antibiotics. Encouraged rest, increase fluid intake. Discussed return precautions. She verbalized understanding and is comfortable with plan.     Layla MawKristen N Silveria Botz, DO 04/02/15 [phone]

## 2015-04-02 NOTE — Discharge Instructions (Signed)
Your upper lip swelling today may have been caused from an allergic reaction to ibuprofen. I recommend you avoid this medication at this time. You may take Tylenol 1000 mg every 6 hours as needed for fever and pain. Your nasal congestion, sore throat and cough are likely caused by a virus. I do not feel you need to be on antibiotics. Your chest x-ray was clear and your strep test was negative. You may use Benadryl 50 mg every 8 hours as needed over-the-counter for lip swelling. If you feel like your lips are getting increasingly swollen or you have tongue swelling with difficulty breathing, wheezing, hives, please call 911 and come back to the hospital.   Upper Respiratory Infection, Adult An upper respiratory infection (URI) is also sometimes known as the common cold. The upper respiratory tract includes the nose, sinuses, throat, trachea, and bronchi. Bronchi are the airways leading to the lungs. Most people improve within 1 week, but symptoms can last up to 2 weeks. A residual cough may last even longer.  CAUSES Many different viruses can infect the tissues lining the upper respiratory tract. The tissues become irritated and inflamed and often become very moist. Mucus production is also common. A cold is contagious. You can easily spread the virus to others by oral contact. This includes kissing, sharing a glass, coughing, or sneezing. Touching your mouth or nose and then touching a surface, which is then touched by another person, can also spread the virus. SYMPTOMS  Symptoms typically develop 1 to 3 days after you come in contact with a cold virus. Symptoms vary from person to person. They may include:  Runny nose.  Sneezing.  Nasal congestion.  Sinus irritation.  Sore throat.  Loss of voice (laryngitis).  Cough.  Fatigue.  Muscle aches.  Loss of appetite.  Headache.  Low-grade fever. DIAGNOSIS  You might diagnose your own cold based on familiar symptoms, since most people get  a cold 2 to 3 times a year. Your caregiver can confirm this based on your exam. Most importantly, your caregiver can check that your symptoms are not due to another disease such as strep throat, sinusitis, pneumonia, asthma, or epiglottitis. Blood tests, throat tests, and X-rays are not necessary to diagnose a common cold, but they may sometimes be helpful in excluding other more serious diseases. Your caregiver will decide if any further tests are required. RISKS AND COMPLICATIONS  You may be at risk for a more severe case of the common cold if you smoke cigarettes, have chronic heart disease (such as heart failure) or lung disease (such as asthma), or if you have a weakened immune system. The very young and very old are also at risk for more serious infections. Bacterial sinusitis, middle ear infections, and bacterial pneumonia can complicate the common cold. The common cold can worsen asthma and chronic obstructive pulmonary disease (COPD). Sometimes, these complications can require emergency medical care and may be life-threatening. PREVENTION  The best way to protect against getting a cold is to practice good hygiene. Avoid oral or hand contact with people with cold symptoms. Wash your hands often if contact occurs. There is no clear evidence that vitamin C, vitamin E, echinacea, or exercise reduces the chance of developing a cold. However, it is always recommended to get plenty of rest and practice good nutrition. TREATMENT  Treatment is directed at relieving symptoms. There is no cure. Antibiotics are not effective, because the infection is caused by a virus, not by bacteria. Treatment may  include:  Increased fluid intake. Sports drinks offer valuable electrolytes, sugars, and fluids.  Breathing heated mist or steam (vaporizer or shower).  Eating chicken soup or other clear broths, and maintaining good nutrition.  Getting plenty of rest.  Using gargles or lozenges for comfort.  Controlling  fevers with ibuprofen or acetaminophen as directed by your caregiver.  Increasing usage of your inhaler if you have asthma. Zinc gel and zinc lozenges, taken in the first 24 hours of the common cold, can shorten the duration and lessen the severity of symptoms. Pain medicines may help with fever, muscle aches, and throat pain. A variety of non-prescription medicines are available to treat congestion and runny nose. Your caregiver can make recommendations and may suggest nasal or lung inhalers for other symptoms.  HOME CARE INSTRUCTIONS   Only take over-the-counter or prescription medicines for pain, discomfort, or fever as directed by your caregiver.  Use a warm mist humidifier or inhale steam from a shower to increase air moisture. This may keep secretions moist and make it easier to breathe.  Drink enough water and fluids to keep your urine clear or pale yellow.  Rest as needed.  Return to work when your temperature has returned to normal or as your caregiver advises. You may need to stay home longer to avoid infecting others. You can also use a face mask and careful hand washing to prevent spread of the virus. SEEK MEDICAL CARE IF:   After the first few days, you feel you are getting worse rather than better.  You need your caregiver's advice about medicines to control symptoms.  You develop chills, worsening shortness of breath, or brown or red sputum. These may be signs of pneumonia.  You develop yellow or brown nasal discharge or pain in the face, especially when you bend forward. These may be signs of sinusitis.  You develop a fever, swollen neck glands, pain with swallowing, or white areas in the back of your throat. These may be signs of strep throat. SEEK IMMEDIATE MEDICAL CARE IF:   You have a fever.  You develop severe or persistent headache, ear pain, sinus pain, or chest pain.  You develop wheezing, a prolonged cough, cough up blood, or have a change in your usual mucus  (if you have chronic lung disease).  You develop sore muscles or a stiff neck. Document Released: 04/14/2001 Document Revised: 01/11/2012 Document Reviewed: 01/24/2014 [facility] Patient Information 2015 [place], [place]. This information is not intended to replace advice given to you by your health care provider. Make sure you discuss any questions you have with your health care provider.

## 2015-04-04 LAB — CULTURE, GROUP A STREP: Strep A Culture: NEGATIVE

## 2015-05-14 ENCOUNTER — Emergency Department (HOSPITAL_COMMUNITY)
Admission: EM | Admit: 2015-05-14 | Discharge: 2015-05-14 | Disposition: A | Discharge status: Home / Self Care | Payer: BLUE CROSS/BLUE SHIELD | Attending: Emergency Medicine | Admitting: Emergency Medicine

## 2015-05-14 ENCOUNTER — Encounter (HOSPITAL_COMMUNITY): Payer: Self-pay | Admitting: Emergency Medicine

## 2015-05-14 DIAGNOSIS — J039 Acute tonsillitis, unspecified: Secondary | ICD-10-CM | POA: Insufficient documentation

## 2015-05-14 DIAGNOSIS — J029 Acute pharyngitis, unspecified: Secondary | ICD-10-CM | POA: Diagnosis present

## 2015-05-14 MED ORDER — AMOXICILLIN 500 MG PO CAPS: 500.0000 mg | ORAL_CAPSULE | Freq: Three times a day (TID) | ORAL | Status: DC

## 2015-05-14 MED ORDER — AMOXICILLIN 250 MG PO CAPS
500.0000 mg | ORAL_CAPSULE | Freq: Once | ORAL | Status: AC
  Administered 2015-05-14: 500 mg via ORAL
  Filled 2015-05-14: qty 2

## 2015-05-14 MED ORDER — MAGIC MOUTHWASH W/LIDOCAINE: 5.0000 mL | Freq: Three times a day (TID) | ORAL | Status: DC | PRN

## 2015-05-14 NOTE — ED Notes (Signed)
Pt. Reports sore throat starting yesterday and pain while swallowing.

## 2015-05-14 NOTE — Discharge Instructions (Signed)
Tonsillitis °Tonsillitis is an infection of the throat. This infection causes the tonsils to become red, tender, and puffy (swollen). Tonsils are groups of tissue at the back of your throat. If bacteria caused your infection, antibiotic medicine will be given to you. Sometimes symptoms of tonsillitis can be relieved with the use of steroid medicine. If your tonsillitis is severe and happens often, you may need to get your tonsils removed (tonsillectomy). °HOME CARE  °· Rest and sleep often. °· Drink enough fluids to keep your pee (urine) clear or pale yellow. °· While your throat is sore, eat soft or liquid foods like: °¨ Soup. °¨ Ice cream. °¨ Instant breakfast drinks. °· Eat frozen ice pops. °· Gargle with a warm or cold liquid to help soothe the throat. Gargle with a water and salt mix. Mix 1/4 teaspoon of salt and 1/4 teaspoon of baking soda in 1 cup of water. °· Only take medicines as told by your doctor. °· If you are given medicines (antibiotics), take them as told. Finish them even if you start to feel better. °GET HELP IF: °· You have large, tender lumps in your neck. °· You have a rash. °· You cough up green, yellow-brown, or bloody fluid. °· You cannot swallow liquids or food for 24 hours. °· You notice that only one of your tonsils is swollen. °GET HELP RIGHT AWAY IF:  °· You throw up (vomit). °· You have a very bad headache. °· You have a stiff neck. °· You have chest pain. °· You have trouble breathing or swallowing. °· You have bad throat pain, drooling, or your voice changes. °· You have bad pain not helped by medicine. °· You cannot fully open your mouth. °· You have redness, puffiness, or bad pain in the neck. °· You have a fever. °MAKE SURE YOU:  °· Understand these instructions. °· Will watch your condition. °· Will get help right away if you are not doing well or get worse. °Document Released: 04/06/2008 Document Revised: 10/24/2013 Document Reviewed: 04/07/2013 °[employer]® Patient Information  ©2015 [employer]. This information is not intended to replace advice given to you by your health care provider. Make sure you discuss any questions you have with your health care provider. ° °

## 2015-05-17 NOTE — ED Provider Notes (Signed)
CSN: [id number]     Arrival date & time 05/14/15  2107 History   First MD Initiated Contact with Patient 05/14/15 2128     Chief Complaint  Patient presents with  . Sore Throat     (Consider location/radiation/quality/duration/timing/severity/associated sxs/prior Treatment) HPI   Lauren Sherman is a 19 y.o. female who presents to the Emergency Department complaining of sore throat that began one day ago.  She reports pain with swallowing and generalized malaise, fever and chills..  She also reports continued fluid intake, but decreased food intake due to level of pain.  She denies neck pain, rash, abd pain, vomiting.  No known sick contacts.     History reviewed. No pertinent past medical history. History reviewed. No pertinent past surgical history. Family History  Problem Relation Age of Onset  . Cancer Other   . Cancer Paternal Grandmother     breast   History  Substance Use Topics  . Smoking status: Never Smoker   . Smokeless tobacco: Never Used  . Alcohol Use: Yes     Comment: occ   OB History    No data available     Review of Systems  Constitutional: Negative for fever, chills, activity change and appetite change.  HENT: Positive for congestion and sore throat. Negative for ear pain, facial swelling, trouble swallowing and voice change.   Eyes: Negative for pain and visual disturbance.  Respiratory: Negative for cough and shortness of breath.   Gastrointestinal: Negative for nausea, vomiting and abdominal pain.  Musculoskeletal: Negative for arthralgias, neck pain and neck stiffness.  Skin: Negative for color change and rash.  Neurological: Negative for speech difficulty, numbness and headaches.  Hematological: Positive for adenopathy.  All other systems reviewed and are negative.     Allergies  Cherry and Peanut-containing drug products  Home Medications   Prior to Admission medications   Medication Sig Start Date End Date Taking? Authorizing Provider   Alum & Mag Hydroxide-Simeth (MAGIC MOUTHWASH W/LIDOCAINE) SOLN Take 5 mLs by mouth 3 (three) times daily as needed for mouth pain. Swish and spit, do not swallow 05/14/15   Lashonda Sonneborn, PA-C  amoxicillin (AMOXIL) 500 MG capsule Take 1 capsule (500 mg total) by mouth 3 (three) times daily. 05/14/15   Cabe Lashley, PA-C   BP 120/80 mmHg  Pulse 89  Temp(Src) 99 F (37.2 C) (Oral)  Resp 18  Ht  (1.6 m)  Wt 173 lb (78.472 kg)  BMI 30.65 kg/m2  SpO2 98%  LMP 05/03/2015 (Exact Date) Physical Exam  Constitutional: She is oriented to person, place, and time. She appears well-developed and well-nourished. No distress.  HENT:  Head: Normocephalic and atraumatic.  Right Ear: Tympanic membrane and ear canal normal.  Left Ear: Tympanic membrane and ear canal normal.  Mouth/Throat: Uvula is midline and mucous membranes are normal. No trismus in the jaw. No uvula swelling. Posterior oropharyngeal edema and posterior oropharyngeal erythema present. No oropharyngeal exudate or tonsillar abscesses.  Neck: Normal range of motion. Neck supple.  Cardiovascular: Normal rate, regular rhythm and normal heart sounds.   Pulmonary/Chest: Effort normal and breath sounds normal.  Abdominal: There is no splenomegaly. There is no tenderness.  Musculoskeletal: Normal range of motion.  Lymphadenopathy:    She has no cervical adenopathy.  Neurological: She is alert and oriented to person, place, and time. She exhibits normal muscle tone. Coordination normal.  Skin: Skin is warm and dry.  Nursing note and vitals reviewed.   ED Course  Procedures (including critical care time) Labs Review Labs Reviewed - No data to display  Imaging Review No results found.   EKG Interpretation None      MDM   Final diagnoses:  Tonsillitis    Pt is well appearing, airway patent.  Non toxic appearing, handles secretions well.  Agrees to PMD f/u if needed.  rx for magic mouthwash and amoxil.      Pauline Ausammy  Cordera Stineman, PA-C 05/17/15 1652  Donnetta HutchingBrian Cook, MD 05/18/15 [phone]

## 2015-05-29 ENCOUNTER — Encounter (HOSPITAL_COMMUNITY): Payer: Self-pay

## 2015-05-29 ENCOUNTER — Emergency Department (HOSPITAL_COMMUNITY)
Admission: EM | Admit: 2015-05-29 | Discharge: 2015-05-29 | Disposition: A | Discharge status: Home / Self Care | Payer: BLUE CROSS/BLUE SHIELD | Attending: Emergency Medicine | Admitting: Emergency Medicine

## 2015-05-29 DIAGNOSIS — R5383 Other fatigue: Secondary | ICD-10-CM | POA: Diagnosis not present

## 2015-05-29 DIAGNOSIS — Z3202 Encounter for pregnancy test, result negative: Secondary | ICD-10-CM | POA: Insufficient documentation

## 2015-05-29 DIAGNOSIS — J020 Streptococcal pharyngitis: Secondary | ICD-10-CM | POA: Insufficient documentation

## 2015-05-29 DIAGNOSIS — J029 Acute pharyngitis, unspecified: Secondary | ICD-10-CM | POA: Diagnosis present

## 2015-05-29 LAB — RAPID STREP SCREEN (MED CTR MEBANE ONLY): Streptococcus, Group A Screen (Direct): POSITIVE — AB

## 2015-05-29 LAB — MONONUCLEOSIS SCREEN: Mono Screen: NEGATIVE

## 2015-05-29 LAB — POC URINE PREG, ED: Preg Test, Ur: NEGATIVE

## 2015-05-29 MED ORDER — IBUPROFEN 800 MG PO TABS
800.0000 mg | ORAL_TABLET | Freq: Once | ORAL | Status: AC
  Administered 2015-05-29: 800 mg via ORAL
  Filled 2015-05-29: qty 1

## 2015-05-29 MED ORDER — AZITHROMYCIN 250 MG PO TABS: ORAL_TABLET | ORAL | Status: DC

## 2015-05-29 MED ORDER — AZITHROMYCIN 250 MG PO TABS
500.0000 mg | ORAL_TABLET | Freq: Once | ORAL | Status: AC
  Administered 2015-05-29: 500 mg via ORAL
  Filled 2015-05-29: qty 2

## 2015-05-29 NOTE — ED Notes (Signed)
Pt reports had strep throat a couple of weeks ago and took the full course of antibiotics.  Sore throat started again 2 days ago.

## 2015-05-29 NOTE — Discharge Instructions (Signed)

## 2015-05-30 NOTE — ED Provider Notes (Signed)
CSN: [id number]     Arrival date & time 05/29/15  1144 History   First MD Initiated Contact with Patient 05/29/15 1151     Chief Complaint  Patient presents with  . Sore Throat     (Consider location/radiation/quality/duration/timing/severity/associated sxs/prior Treatment) The history is provided by the patient.   Lauren Sherman is a 19 y.o. female presenting with sore throat which started 2 days ago.  She was treated for presumptive strep throat on 7/12 with a 10 day course which she completed and her symptoms resolved for a few days, but has now returned. She has been exposed to coworkers with strep throat over the past couple of weeks.  She denies nasal congestion, drainage, ear pain, dizziness, cough, sob or chest pain.  She had nausea and abdominal pain with her prior infection but not now. She has taken nyquill without relief of symptoms. She endorses persistent fatigue, states she works 2 jobs and works 7 days/week.      History reviewed. No pertinent past medical history. History reviewed. No pertinent past surgical history. Family History  Problem Relation Age of Onset  . Cancer Other   . Cancer Paternal Grandmother     breast   History  Substance Use Topics  . Smoking status: Never Smoker   . Smokeless tobacco: Never Used  . Alcohol Use: Yes     Comment: occ   OB History    No data available     Review of Systems  Constitutional: Positive for fatigue. Negative for fever and chills.  HENT: Positive for sore throat. Negative for congestion, ear pain, rhinorrhea, sinus pressure, trouble swallowing and voice change.   Eyes: Negative for discharge.  Respiratory: Negative for cough, shortness of breath, wheezing and stridor.   Cardiovascular: Negative for chest pain.  Gastrointestinal: Negative for nausea, vomiting and abdominal pain.  Genitourinary: Negative.   Skin: Negative for rash.  Neurological: Negative for dizziness and light-headedness.      Allergies   Cherry and Peanut-containing drug products  Home Medications   Prior to Admission medications   Medication Sig Start Date End Date Taking? Authorizing Provider  Pseudoeph-Doxylamine-DM-APAP (NYQUIL PO) Take 30 mLs by mouth every 6 (six) hours as needed (cough).   Yes Historical Provider, MD  Alum & Mag Hydroxide-Simeth (MAGIC MOUTHWASH W/LIDOCAINE) SOLN Take 5 mLs by mouth 3 (three) times daily as needed for mouth pain. Swish and spit, do not swallow Patient not taking: Reported on 05/29/2015 05/14/15   Tammy Triplett, PA-C  amoxicillin (AMOXIL) 500 MG capsule Take 1 capsule (500 mg total) by mouth 3 (three) times daily. Patient not taking: Reported on 05/29/2015 05/14/15   Tammy Triplett, PA-C  azithromycin (ZITHROMAX) 250 MG tablet Take one tablet daily 05/29/15   Burgess Amor, PA-C   BP 118/81 mmHg  Pulse 72  Temp(Src) 98.1 F (36.7 C) (Oral)  Resp 16  Ht 5\' 3"  (1.6 m)  Wt 175 lb (79.379 kg)  BMI 31.01 kg/m2  SpO2 100%  LMP 05/03/2015 (Exact Date) Physical Exam  Constitutional: She is oriented to person, place, and time. She appears well-developed and well-nourished.  HENT:  Head: Normocephalic and atraumatic.  Right Ear: Tympanic membrane and ear canal normal.  Left Ear: Tympanic membrane and ear canal normal.  Nose: Mucosal edema and rhinorrhea present.  Mouth/Throat: Uvula is midline and mucous membranes are normal. Oropharyngeal exudate and posterior oropharyngeal erythema present. No posterior oropharyngeal edema or tonsillar abscesses.  2 + bilateral tonsillar edema. Symmetric.  Eyes:  Conjunctivae are normal.  Cardiovascular: Normal rate and normal heart sounds.   Pulmonary/Chest: Effort normal. No respiratory distress. She has no wheezes. She has no rales.  Abdominal: Soft. There is no tenderness.  Musculoskeletal: Normal range of motion.  Neurological: She is alert and oriented to person, place, and time.  Skin: Skin is warm and dry. No rash noted.  Psychiatric: She has a  normal mood and affect.    ED Course  Procedures (including critical care time) Labs Review Labs Reviewed  RAPID STREP SCREEN (NOT AT [facility]) - Abnormal; Notable for the following:    Streptococcus, Group A Screen (Direct) POSITIVE (*)    All other components within normal limits  MONONUCLEOSIS SCREEN  POC URINE PREG, ED    Imaging Review No results found.   EKG Interpretation None      MDM   Final diagnoses:  Strep throat    Pt was placed on zithromax given recent round of amoxil, first dose given here. Encouraged rest, increased fluid intake, motrin or tylenol for pain relief.    The patient appears reasonably screened and/or stabilized for discharge and I doubt any other medical condition or other [facility] requiring further screening, evaluation, or treatment in the ED at this time prior to discharge.     Burgess Amor, PA-C 05/30/15 1642  Bethann Berkshire, MD 06/10/15 [phone]

## 2015-06-26 ENCOUNTER — Encounter: Payer: Self-pay | Admitting: Adult Health

## 2015-06-26 ENCOUNTER — Ambulatory Visit (INDEPENDENT_AMBULATORY_CARE_PROVIDER_SITE_OTHER): Payer: BLUE CROSS/BLUE SHIELD | Admitting: Adult Health

## 2015-06-26 VITALS — BP 110/70 | HR 68 | Ht 63.0 in | Wt 173.4 lb

## 2015-06-26 DIAGNOSIS — Z319 Encounter for procreative management, unspecified: Secondary | ICD-10-CM | POA: Diagnosis not present

## 2015-06-26 MED ORDER — CITRANATAL 90 DHA 90-1 & 300 MG PO MISC: ORAL | Status: DC

## 2015-06-26 NOTE — Progress Notes (Addendum)
Subjective:     Patient ID: Lauren Sherman, female   DOB: 06-26-1996, 19 y.o.   MRN: [id number]  HPI Lauren Sherman is a 19 year old mixed female in to discuss getting pregnant, has been with partner 5 years and trying for 3 years to get pregnant without success.  Review of Systems Patient denies any headaches, hearing loss, fatigue, blurred vision, shortness of breath, chest pain, abdominal pain, problems with bowel movements, urination, or intercourse. No joint pain or mood swings. Reviewed past medical,surgical, social and family history. Reviewed medications and allergies.     Objective:   Physical Exam BP 110/70 mmHg  Pulse 68  Wt 173 lb 6.4 oz (78.654 kg)  LMP 06/11/2015  Ht 5'3", Skin warm and dry. Neck: mid line trachea, normal thyroid, good ROM, no lymphadenopathy noted. Lungs: clear to ausculation bilaterally. Cardiovascular: regular rate and rhythm. Face time 15 minutes, discussing timing of sex and ovulation, will get TSH and progesterone level day 21 and discussed if not ovulating., may need to try clomid, and she is aware 7% chance of twins    Assessment:     Desires pregnancy    Plan:     Rx citranatal with 11 refills   Check TSH and prolactin 8/29 Have sex every other day, pee before sex and lay there after at least 30 minutes Follow up in 3 months

## 2015-06-26 NOTE — Patient Instructions (Signed)
Get labs 8/29 Sex every other day F/u in 3 months

## 2015-07-04 LAB — TSH: TSH: 1.09 u[IU]/mL (ref 0.450–4.500)

## 2015-07-04 LAB — PROGESTERONE: Progesterone: 5.4 ng/mL

## 2015-07-09 ENCOUNTER — Telehealth: Payer: Self-pay | Admitting: Adult Health

## 2015-07-09 NOTE — Telephone Encounter (Signed)
Left message to call about labs 

## 2015-08-08 ENCOUNTER — Emergency Department (HOSPITAL_COMMUNITY)
Admission: EM | Admit: 2015-08-08 | Discharge: 2015-08-08 | Disposition: A | Discharge status: Home / Self Care | Payer: BLUE CROSS/BLUE SHIELD | Attending: Emergency Medicine | Admitting: Emergency Medicine

## 2015-08-08 ENCOUNTER — Encounter (HOSPITAL_COMMUNITY): Payer: Self-pay | Admitting: *Deleted

## 2015-08-08 DIAGNOSIS — Z79899 Other long term (current) drug therapy: Secondary | ICD-10-CM | POA: Insufficient documentation

## 2015-08-08 DIAGNOSIS — A599 Trichomoniasis, unspecified: Secondary | ICD-10-CM | POA: Diagnosis not present

## 2015-08-08 DIAGNOSIS — Z3202 Encounter for pregnancy test, result negative: Secondary | ICD-10-CM | POA: Diagnosis not present

## 2015-08-08 DIAGNOSIS — N898 Other specified noninflammatory disorders of vagina: Secondary | ICD-10-CM | POA: Diagnosis present

## 2015-08-08 LAB — POC URINE PREG, ED: Preg Test, Ur: NEGATIVE

## 2015-08-08 LAB — URINALYSIS, ROUTINE W REFLEX MICROSCOPIC
Bilirubin Urine: NEGATIVE
Glucose, UA: NEGATIVE mg/dL
Ketones, ur: NEGATIVE mg/dL
NITRITE: NEGATIVE
PROTEIN: NEGATIVE mg/dL
UROBILINOGEN UA: 0.2 mg/dL (ref 0.0–1.0)
pH: 5.5 (ref 5.0–8.0)

## 2015-08-08 LAB — WET PREP, GENITAL
Clue Cells Wet Prep HPF POC: NONE SEEN
Yeast Wet Prep HPF POC: NONE SEEN

## 2015-08-08 LAB — URINE MICROSCOPIC-ADD ON

## 2015-08-08 MED ORDER — METRONIDAZOLE 500 MG PO TABS
2000.0000 mg | ORAL_TABLET | Freq: Once | ORAL | Status: AC
  Administered 2015-08-08: 2000 mg via ORAL
  Filled 2015-08-08: qty 4

## 2015-08-08 MED ORDER — AZITHROMYCIN 250 MG PO TABS
ORAL_TABLET | ORAL | Status: DC
Start: 2015-08-08 — End: 2015-12-17

## 2015-08-08 MED ORDER — CEFTRIAXONE SODIUM 250 MG IJ SOLR
250.0000 mg | INTRAMUSCULAR | Status: DC
  Administered 2015-08-08: 250 mg via INTRAMUSCULAR
  Filled 2015-08-08: qty 250

## 2015-08-08 MED ORDER — LIDOCAINE HCL (PF) 1 % IJ SOLN
INTRAMUSCULAR | Status: AC
  Administered 2015-08-08: 5 mL
  Filled 2015-08-08: qty 5

## 2015-08-08 NOTE — Discharge Instructions (Signed)
Sexually Transmitted Disease A sexually transmitted disease (STD) is a disease or infection often passed to another person during sex. However, STDs can be passed through nonsexual ways. An STD can be passed through:  Spit (saliva).  Semen.  Blood.  Mucus from the vagina.  Pee (urine). HOW CAN I LESSEN MY CHANCES OF GETTING AN STD?  Use:  Latex condoms.  Water-soluble lubricants with condoms. Do not use petroleum jelly or oils.  Dental dams. These are small pieces of latex that are used as a barrier during oral sex.  Avoid having more than one sex partner.  Do not have sex with someone who has other sex partners.  Do not have sex with anyone you do not know or who is at high risk for an STD.  Avoid risky sex that can break your skin.  Do not have sex if you have open sores on your mouth or skin.  Avoid drinking too much alcohol or taking illegal drugs. Alcohol and drugs can affect your good judgment.  Avoid oral and anal sex acts.  Get shots (vaccines) for HPV and hepatitis.  If you are at risk of being infected with HIV, it is advised that you take a certain medicine daily to prevent HIV infection. This is called pre-exposure prophylaxis (PrEP). You may be at risk if:  You are a man who has sex with other men (MSM).  You are attracted to the opposite sex (heterosexual) and are having sex with more than one partner.  You take drugs with a needle.  You have sex with someone who has HIV.  Talk with your doctor about if you are at high risk of being infected with HIV. If you begin to take PrEP, get tested for HIV first. Get tested every 3 months for as long as you are taking PrEP.  Get tested for STDs every year if you are sexually active. If you are treated for an STD, get tested again 3 months after you are treated. WHAT SHOULD I DO IF I THINK I HAVE AN STD?  See your doctor.  Tell your sex partner(s) that you have an STD. They should be tested and treated.  Do  not have sex until your doctor says it is okay. WHEN SHOULD I GET HELP? Get help right away if:  You have bad belly (abdominal) pain.  You are a man and have puffiness (swelling) or pain in your testicles.  You are a woman and have puffiness in your vagina.   This information is not intended to replace advice given to you by your health care provider. Make sure you discuss any questions you have with your health care provider.   Document Released: 11/26/2004 Document Revised: 11/09/2014 Document Reviewed: 04/14/2013 Elsevier Interactive Patient Education 2016 [employer].  Trichomoniasis Trichomoniasis is an infection caused by an organism called Trichomonas. The infection can affect both women and men. In women, the outer female genitalia and the vagina are affected. In men, the penis is mainly affected, but the prostate and other reproductive organs can also be involved. Trichomoniasis is a sexually transmitted infection (STI) and is most often passed to another person through sexual contact.  RISK FACTORS  Having unprotected sexual intercourse.  Having sexual intercourse with an infected partner. SIGNS AND SYMPTOMS  Symptoms of trichomoniasis in women include:  Abnormal gray-green frothy vaginal discharge.  Itching and irritation of the vagina.  Itching and irritation of the area outside the vagina. Symptoms of trichomoniasis in men include:  Penile discharge with or without pain.  Pain during urination. This results from inflammation of the urethra. DIAGNOSIS  Trichomoniasis may be found during a Pap test or physical exam. Your health care provider may use one of the following methods to help diagnose this infection:  Testing the pH of the vagina with a test tape.  Using a vaginal swab test that checks for the Trichomonas organism. A test is available that provides results within a few minutes.  Examining a urine sample.  Testing vaginal secretions. Your health  care provider may test you for other STIs, including HIV. TREATMENT   You may be given medicine to fight the infection. Women should inform their health care provider if they could be or are pregnant. Some medicines used to treat the infection should not be taken during pregnancy.  Your health care provider may recommend over-the-counter medicines or creams to decrease itching or irritation.  Your sexual partner will need to be treated if infected.  Your health care provider may test you for infection again 3 months after treatment. HOME CARE INSTRUCTIONS   Take medicines only as directed by your health care provider.  Take over-the-counter medicine for itching or irritation as directed by your health care provider.  Do not have sexual intercourse while you have the infection.  Women should not douche or wear tampons while they have the infection.  Discuss your infection with your partner. Your partner may have gotten the infection from you, or you may have gotten it from your partner.  Have your sex partner get examined and treated if necessary.  Practice safe, informed, and protected sex.  See your health care provider for other STI testing. SEEK MEDICAL CARE IF:   You still have symptoms after you finish your medicine.  You develop abdominal pain.  You have pain when you urinate.  You have bleeding after sexual intercourse.  You develop a rash.  Your medicine makes you sick or makes you throw up (vomit). MAKE SURE YOU:  Understand these instructions.  Will watch your condition.  Will get help right away if you are not doing well or get worse.   This information is not intended to replace advice given to you by your health care provider. Make sure you discuss any questions you have with your health care provider.   Document Released: 04/14/2001 Document Revised: 11/09/2014 Document Reviewed: 07/31/2013 Elsevier Interactive Patient Education [employer]2016 Elsevier Inc.

## 2015-08-08 NOTE — ED Notes (Signed)
Pt C/O of itching in the vaginal area since this morning. Pt reports changing soap product x1 week. Denies discharge/burning.

## 2015-08-08 NOTE — ED Notes (Signed)
Patient reports vaginal itching that started this morning. Denies any discharge.

## 2015-08-09 LAB — GC/CHLAMYDIA PROBE AMP (~~LOC~~) NOT AT ARMC
Chlamydia: NEGATIVE
Neisseria Gonorrhea: NEGATIVE

## 2015-08-10 LAB — URINE CULTURE

## 2015-08-11 NOTE — ED Provider Notes (Signed)
CSN: [id number]     Arrival date & time 08/08/15  1511 History   First MD Initiated Contact with Patient 08/08/15 1521     Chief Complaint  Patient presents with  . Vaginal Itching     (Consider location/radiation/quality/duration/timing/severity/associated sxs/prior Treatment) HPI  Lauren Sherman is a 19 y.o. female who presents to the Emergency Department complaining of vaginal itching for several hours.  She describes an itching sensation inside and to the external vagina.  She admits to unprotected intercourse but same sexual partner.  She denies any pain, vaginal bleeding or discharge, abdominal pain, dysuria, burning sensation, genital lesions, or new medications.  She does admit to using a new soap recently.     History reviewed. No pertinent past medical history. History reviewed. No pertinent past surgical history. Family History  Problem Relation Age of Onset  . Cancer Other   . Cancer Paternal Grandmother     breast   Social History  Substance Use Topics  . Smoking status: Never Smoker   . Smokeless tobacco: Never Used  . Alcohol Use: Yes     Comment: occ   OB History    Gravida Para Term Preterm AB TAB SAB Ectopic Multiple Living       Review of Systems  Constitutional: Negative for fever, chills and appetite change.  Respiratory: Negative for shortness of breath.   Cardiovascular: Negative for chest pain.  Gastrointestinal: Negative for nausea, vomiting, abdominal pain and blood in stool.  Genitourinary: Negative for dysuria, hematuria, flank pain, decreased urine volume, vaginal bleeding, vaginal discharge, difficulty urinating, genital sores, menstrual problem and pelvic pain.       Vaginal itching  Musculoskeletal: Negative for back pain.  Skin: Negative for color change and rash.  Neurological: Negative for dizziness, weakness and numbness.  Hematological: Negative for adenopathy.  All other systems reviewed and are  negative.     Allergies  Cherry and Peanut-containing drug products  Home Medications   Prior to Admission medications   Medication Sig Start Date End Date Taking? Authorizing Provider  albuterol (PROVENTIL HFA;VENTOLIN HFA) 108 (90 BASE) MCG/ACT inhaler Inhale 1-2 puffs into the lungs every 6 (six) hours as needed for wheezing or shortness of breath.   Yes Historical Provider, MD  azithromycin (ZITHROMAX) 250 MG tablet Take 1000 mg (4 tabs) po as a single dose. 08/08/15   Ebonee Stober, PA-C   BP 118/78 mmHg  Pulse 81  Temp(Src) 97.8 F (36.6 C) (Oral)  Resp 18  Ht  (1.6 m)  Wt 175 lb (79.379 kg)  BMI 31.01 kg/m2  SpO2 100%  LMP 07/12/2015   Physical Exam  Constitutional: She is oriented to person, place, and time. She appears well-developed and well-nourished. No distress.  HENT:  Head: Normocephalic and atraumatic.  Mouth/Throat: Oropharynx is clear and moist.  Neck: Normal range of motion. Neck supple.  Cardiovascular: Normal rate, regular rhythm and normal heart sounds.   Pulmonary/Chest: Effort normal and breath sounds normal. No respiratory distress.  Abdominal: Soft. She exhibits no distension. There is no tenderness. There is no rebound and no guarding.  Genitourinary: Uterus normal. There is rash on the right labia. There is no tenderness or lesion on the right labia. There is no rash, tenderness or lesion on the left labia. Cervix exhibits no motion tenderness, no discharge and no friability. Right adnexum displays no mass and no tenderness. Left adnexum displays no mass and no tenderness.  No tenderness or bleeding in the vagina. No foreign body around the vagina. No signs of injury around the vagina. Vaginal discharge found.  Thin, white discharge in the vaginal vault.  No CMT, no adnexal masses or tenderness.    Musculoskeletal: Normal range of motion.  Lymphadenopathy:       Right: No inguinal adenopathy present.       Left: No inguinal adenopathy present.   Neurological: She is alert and oriented to person, place, and time. She exhibits normal muscle tone. Coordination normal.  Skin: Skin is warm and dry.  Psychiatric: She has a normal mood and affect.  Nursing note and vitals reviewed.   ED Course  Procedures (including critical care time) Labs Review Labs Reviewed  WET PREP, GENITAL - Abnormal; Notable for the following:    Trich, Wet Prep FEW (*)    WBC, Wet Prep HPF POC TOO NUMEROUS TO COUNT (*)    All other components within normal limits  URINALYSIS, ROUTINE W REFLEX MICROSCOPIC (NOT AT [facility]) - Abnormal; Notable for the following:    Specific Gravity, Urine >1.030 (*)    Hgb urine dipstick TRACE (*)    Leukocytes, UA SMALL (*)    All other components within normal limits  URINE MICROSCOPIC-ADD ON - Abnormal; Notable for the following:    Squamous Epithelial / LPF FEW (*)    Bacteria, UA FEW (*)    All other components within normal limits  URINE CULTURE  POC URINE PREG, ED  GC/CHLAMYDIA PROBE AMP ([place]) NOT AT [facility]    Imaging Review No results found. I have personally reviewed and evaluated these images and lab results as part of my medical decision-making.   EKG Interpretation None      Cultures pending   MDM   Final diagnoses:  Trichimoniasis    Pt well appearing, vitals stable.  No abdominal tenderness on exam.  No concerning sx's for TOA, PID, or torsion on exam.  Will tx for trich with flagyl and prophylactically with zithromax and IM rocephin.  I have discussed risks involved in unsafe sexual practices, she verbalizes understanding and agrees to arrange GYN f/u.      Pauline Aus, PA-C 08/11/15 0831  Lavera Guise, MD 08/12/15 [phone]

## 2015-09-30 ENCOUNTER — Ambulatory Visit: Payer: BLUE CROSS/BLUE SHIELD | Admitting: Adult Health

## 2015-10-28 ENCOUNTER — Encounter (HOSPITAL_COMMUNITY): Payer: Self-pay | Admitting: *Deleted

## 2015-10-28 ENCOUNTER — Emergency Department (HOSPITAL_COMMUNITY)
Admission: EM | Admit: 2015-10-28 | Discharge: 2015-10-28 | Disposition: A | Discharge status: Home / Self Care | Payer: BLUE CROSS/BLUE SHIELD | Attending: Emergency Medicine | Admitting: Emergency Medicine

## 2015-10-28 DIAGNOSIS — J039 Acute tonsillitis, unspecified: Secondary | ICD-10-CM | POA: Diagnosis not present

## 2015-10-28 DIAGNOSIS — J029 Acute pharyngitis, unspecified: Secondary | ICD-10-CM | POA: Diagnosis present

## 2015-10-28 LAB — RAPID STREP SCREEN (MED CTR MEBANE ONLY): STREPTOCOCCUS, GROUP A SCREEN (DIRECT): NEGATIVE

## 2015-10-28 MED ORDER — AMOXICILLIN 250 MG PO CAPS
500.0000 mg | ORAL_CAPSULE | Freq: Once | ORAL | Status: AC
  Administered 2015-10-28: 500 mg via ORAL
  Filled 2015-10-28: qty 2

## 2015-10-28 MED ORDER — IBUPROFEN 800 MG PO TABS
800.0000 mg | ORAL_TABLET | Freq: Once | ORAL | Status: AC
Start: 2015-10-28 — End: 2015-10-28
  Administered 2015-10-28: 800 mg via ORAL
  Filled 2015-10-28: qty 1

## 2015-10-28 MED ORDER — AMOXICILLIN 500 MG PO CAPS: 500.0000 mg | ORAL_CAPSULE | Freq: Three times a day (TID) | ORAL | Status: AC

## 2015-10-28 MED ORDER — IBUPROFEN 600 MG PO TABS: 600.0000 mg | ORAL_TABLET | Freq: Four times a day (QID) | ORAL | Status: DC | PRN

## 2015-10-28 NOTE — Discharge Instructions (Signed)

## 2015-10-28 NOTE — ED Notes (Signed)
Instructed pt to take all of antibiotics as prescribed. 

## 2015-10-28 NOTE — ED Provider Notes (Signed)
CSN: [id number]     Arrival date & time 10/28/15  2046 History  By signing my name below, I, [facility] Sherman, attest that this documentation has been prepared under the direction and in the presence of Burgess AmorJulie Chales Pelissier, PA-C. Electronically Signed: Randell PatientMarrissa Sherman, ED Scribe. 10/28/2015. 10:32 PM.   Chief Complaint  Patient presents with  . Sore Throat   The history is provided by the patient. No language interpreter was used.   HPI Comments: Lauren Sherman is a 19 y.o. female who presents to the Emergency Department complaining of mild sore throat onset 3 days ago upon waking. Patient endorses difficulty swallowing secondary to pain. She denies sick contact with individuals with strep throat but notes that she works as a Production designer, theatre/television/filmmanager at a [employer] and has frequent contact with money and people. She has taken OTC cold/flu medication and Halls throat lozenges with some relief. Patient denies cough, nasal congestion, ear pain, and SOB. NAD. LNMP earlier this month.  No PCP History reviewed. No pertinent past medical history. History reviewed. No pertinent past surgical history. Family History  Problem Relation Age of Onset  . Cancer Other   . Cancer Paternal Grandmother     breast   Social History  Substance Use Topics  . Smoking status: Never Smoker   . Smokeless tobacco: Never Used  . Alcohol Use: Yes     Comment: occ   OB History    Gravida Para Term Preterm AB TAB SAB Ectopic Multiple Living   0 0 0 0 0 0 0 0 0 0      Review of Systems  HENT: Positive for sore throat. Negative for congestion and ear pain.   Respiratory: Negative for cough and shortness of breath.       Allergies  Cherry and Peanut-containing drug products  Home Medications   Prior to Admission medications   Medication Sig Start Date End Date Taking? Authorizing Provider  albuterol (PROVENTIL HFA;VENTOLIN HFA) 108 (90 BASE) MCG/ACT inhaler Inhale 1-2 puffs into the lungs every 6 (six) hours as  needed for wheezing or shortness of breath.    Historical Provider, MD  amoxicillin (AMOXIL) 500 MG capsule Take 1 capsule (500 mg total) by mouth 3 (three) times daily. 10/28/15 11/07/15  Burgess AmorJulie Sandie Swayze, PA-C  azithromycin (ZITHROMAX) 250 MG tablet Take 1000 mg (4 tabs) po as a single dose. 08/08/15   Tammy Triplett, PA-C  ibuprofen (ADVIL,MOTRIN) 600 MG tablet Take 1 tablet (600 mg total) by mouth every 6 (six) hours as needed. 10/28/15   Burgess AmorJulie Kinney Sackmann, PA-C   BP 120/77 mmHg  Pulse 89  Temp(Src) 98.7 F (37.1 C) (Oral)  Resp 15  Ht 5\' 3"  (1.6 m)  Wt 77.111 kg  BMI 30.12 kg/m2  SpO2 100%  LMP 10/08/2015 Physical Exam  Constitutional: She is oriented to person, place, and time. She appears well-developed and well-nourished.  HENT:  Head: Normocephalic and atraumatic.  Right Ear: Tympanic membrane and ear canal normal.  Left Ear: Tympanic membrane and ear canal normal.  Nose: Mucosal edema and rhinorrhea present.  Mouth/Throat: Uvula is midline, oropharynx is clear and moist and mucous membranes are normal. No oropharyngeal exudate, posterior oropharyngeal edema, posterior oropharyngeal erythema or tonsillar abscesses.  Moderately swollen tonsils. They are symmetric and erythematous. Uvula midline. Tonsils are 3+ bilateral.  Eyes: Conjunctivae are normal.  Cardiovascular: Normal rate and normal heart sounds.   Pulmonary/Chest: Effort normal. No respiratory distress. She has no wheezes. She has no rales.  Abdominal: Soft.  There is no tenderness.  Musculoskeletal: Normal range of motion.  Neurological: She is alert and oriented to person, place, and time.  Skin: Skin is warm and dry. No rash noted.  Psychiatric: She has a normal mood and affect.    ED Course  Procedures   DIAGNOSTIC STUDIES: Oxygen Saturation is 100% on RA, normal by my interpretation.    COORDINATION OF CARE: 10:24 PM Discussed results of strep test. Advised to take 600 mg of Motrin every 6 hours. Will prescribe  amoxicillin given exam findings, pt with impressive tonsillitis w/o evidence for peritonsillar abscess. Advised to return to ED if symptoms worsen. Discussed treatment plan with pt at bedside and pt agreed to plan.  Labs Review Labs Reviewed  RAPID STREP SCREEN (NOT AT [facility])  CULTURE, GROUP A STREP    Imaging Review No results found. I have personally reviewed and evaluated these images and lab results as part of my medical decision-making.   EKG Interpretation None      MDM   Final diagnoses:  Acute tonsillitis, unspecified etiology    I personally performed the services described in this documentation, which was scribed in my presence. The recorded information has been reviewed and is accurate.     Burgess Amor, PA-C 10/29/15 1409  Zadie Rhine, MD 10/29/15 [phone]

## 2015-10-28 NOTE — ED Notes (Signed)
Pt c/o sore throat since Friday. Pt states she has white pockets in the back of her throat.

## 2015-10-31 LAB — CULTURE, GROUP A STREP: Strep A Culture: POSITIVE — AB

## 2015-11-01 ENCOUNTER — Telehealth (HOSPITAL_COMMUNITY): Payer: Self-pay

## 2015-11-01 NOTE — Telephone Encounter (Signed)
Post ED Visit - Positive Culture Follow-up  Culture report reviewed by antimicrobial stewardship pharmacist:  []  Lauren Sherman, Pharm.D. []  Lauren Sherman, Pharm.D., BCPS [x]  Lauren Sherman, Pharm.D. []  Lauren Sherman, Pharm.D., BCPS []  Lauren Sherman, [address] BCPS, AAHIVP []  Lauren Sherman, Pharm.D., BCPS, AAHIVP []  Lauren Sherman, Pharm.D. []  Lauren Sherman, [address]  Positive throat culture, Group A Strep Treated with Amoxicillin, organism sensitive to the same and no further patient follow-up is required at this time.  Arvid RightClark, [name] 11/01/2015, 9:09 AM

## 2015-12-17 ENCOUNTER — Encounter: Payer: Self-pay | Admitting: Adult Health

## 2015-12-17 ENCOUNTER — Ambulatory Visit (INDEPENDENT_AMBULATORY_CARE_PROVIDER_SITE_OTHER): Payer: BLUE CROSS/BLUE SHIELD | Admitting: Adult Health

## 2015-12-17 VITALS — BP 108/80 | HR 70 | Ht 63.0 in | Wt 167.0 lb

## 2015-12-17 DIAGNOSIS — Z3202 Encounter for pregnancy test, result negative: Secondary | ICD-10-CM

## 2015-12-17 DIAGNOSIS — Z3009 Encounter for other general counseling and advice on contraception: Secondary | ICD-10-CM | POA: Diagnosis not present

## 2015-12-17 HISTORY — DX: Encounter for other general counseling and advice on contraception: Z30.09

## 2015-12-17 LAB — POCT URINE PREGNANCY: PREG TEST UR: NEGATIVE

## 2015-12-17 NOTE — Patient Instructions (Signed)
Will order nexplanon   Return in 3 weeks for nexplanon insertion

## 2015-12-17 NOTE — Progress Notes (Signed)
Subjective:     Patient ID: Lauren Sherman, female   DOB: [birth year], 19 y.o.   MRN: [id number]  HPI [place] is a 20 year old bi racial female in to discuss nexplanon.   Review of Systems Patient denies any headaches, hearing loss, fatigue, blurred vision, shortness of breath, chest pain, abdominal pain, problems with bowel movements, urination, or intercourse. No joint pain or mood swings. Reviewed past medical,surgical, social and family history. Reviewed medications and allergies.     Objective:   Physical Exam BP 108/80 mmHg  Pulse 70  Ht  (1.6 m)  Wt 167 lb (75.751 kg)  BMI 29.59 kg/m2  LMP 02/03/2017UPT negative, Skin warm and dry. Neck: mid line trachea, normal thyroid, good ROM, no lymphadenopathy noted. Lungs: clear to ausculation bilaterally. Cardiovascular: regular rate and rhythm.Discussed nexplanon benefits and risks, and she wants to try it.    Assessment:     Contraceptive ed.    Plan:      Order nexplanon No sex Return in 3 weeks for nexplanon insertion when on period, call before if starts Review handout on nexplanon

## 2016-01-04 ENCOUNTER — Emergency Department (HOSPITAL_COMMUNITY)
Admission: EM | Admit: 2016-01-04 | Discharge: 2016-01-04 | Disposition: A | Discharge status: Home / Self Care | Payer: BLUE CROSS/BLUE SHIELD | Attending: Emergency Medicine | Admitting: Emergency Medicine

## 2016-01-04 ENCOUNTER — Encounter (HOSPITAL_COMMUNITY): Payer: Self-pay | Admitting: Emergency Medicine

## 2016-01-04 DIAGNOSIS — Z79899 Other long term (current) drug therapy: Secondary | ICD-10-CM | POA: Diagnosis not present

## 2016-01-04 DIAGNOSIS — N760 Acute vaginitis: Secondary | ICD-10-CM | POA: Diagnosis not present

## 2016-01-04 DIAGNOSIS — B9689 Other specified bacterial agents as the cause of diseases classified elsewhere: Secondary | ICD-10-CM

## 2016-01-04 DIAGNOSIS — Z202 Contact with and (suspected) exposure to infections with a predominantly sexual mode of transmission: Secondary | ICD-10-CM | POA: Diagnosis present

## 2016-01-04 LAB — WET PREP, GENITAL
Sperm: NONE SEEN
Trich, Wet Prep: NONE SEEN
Yeast Wet Prep HPF POC: NONE SEEN

## 2016-01-04 MED ORDER — METRONIDAZOLE 500 MG PO TABS: 500.0000 mg | ORAL_TABLET | Freq: Two times a day (BID) | ORAL | Status: DC

## 2016-01-04 NOTE — ED Notes (Signed)
Patient c/o vaginal itching that started yesterday with white discharge. Denies any odor, pain, or lesions. Patient would like to be checked for STD.

## 2016-01-04 NOTE — ED Provider Notes (Signed)
CSN: [id number]     Arrival date & time 01/04/16  1758 History   First MD Initiated Contact with Patient 01/04/16 1933     Chief Complaint  Patient presents with  . Exposure to STD     (Consider location/radiation/quality/duration/timing/severity/associated sxs/prior Treatment) HPI Comments: Patient states that she had some vaginal itching today while in the shower. She noticed a white discharge, and became frightened that she may have contracted an STD. No fever, no vaginal bleeding, no unusual rash.  Patient is a 20 y.o. female presenting with STD exposure. The history is provided by the patient.  Exposure to STD This is a new problem. The current episode started yesterday. The problem occurs intermittently. The problem has been unchanged. Pertinent negatives include no abdominal pain, fever, rash or vomiting. Associated symptoms comments: Vaginal itching and white discharge.. Nothing aggravates the symptoms. She has tried nothing for the symptoms. The treatment provided no relief.    Past Medical History  Diagnosis Date  . Encounter for education about contraceptive use 12/17/2015   History reviewed. No pertinent past surgical history. Family History  Problem Relation Age of Onset  . Cancer Other   . Cancer Paternal Grandmother     breast   Social History  Substance Use Topics  . Smoking status: Never Smoker   . Smokeless tobacco: Never Used  . Alcohol Use: Yes     Comment: occ   OB History    Gravida Para Term Preterm AB TAB SAB Ectopic Multiple Living   0 0 0 0 0 0 0 0 0 0      Review of Systems  Constitutional: Negative for fever.  Gastrointestinal: Negative for vomiting and abdominal pain.  Genitourinary: Positive for vaginal discharge. Negative for dysuria, vaginal pain and dyspareunia.  Skin: Negative for rash.  All other systems reviewed and are negative.     Allergies  Cherry and Peanut-containing drug products  Home Medications   Prior to Admission  medications   Medication Sig Start Date End Date Taking? Authorizing Provider  albuterol (PROVENTIL HFA;VENTOLIN HFA) 108 (90 BASE) MCG/ACT inhaler Inhale 1-2 puffs into the lungs every 6 (six) hours as needed for wheezing or shortness of breath.   Yes Historical Provider, MD   BP 115/73 mmHg  Pulse 67  Temp(Src) 98.1 F (36.7 C) (Oral)  Resp 18  Ht 5\' 3"  (1.6 m)  Wt 74.844 kg  BMI 29.24 kg/m2  SpO2 100%  LMP 12/06/2015 Physical Exam  Constitutional: She is oriented to person, place, and time. She appears well-developed and well-nourished.  Non-toxic appearance.  HENT:  Head: Normocephalic.  Right Ear: Tympanic membrane and external ear normal.  Left Ear: Tympanic membrane and external ear normal.  Eyes: EOM and lids are normal. Pupils are equal, round, and reactive to light.  Neck: Normal range of motion. Neck supple. Carotid bruit is not present.  Cardiovascular: Normal rate, regular rhythm, normal heart sounds, intact distal pulses and normal pulses.   Pulmonary/Chest: Breath sounds normal. No respiratory distress.  Abdominal: Soft. Bowel sounds are normal. There is no tenderness. There is no guarding.  Genitourinary:  Chaperone present during the examination. The external structures show no acute abnormality or rash. There is no foreign body noted within the vaginal vault. There is a white thin discharge with foul smell. The os of the cervix is friable. There is no wall motion tenderness. There is no adnexal tenderness or mass.  Musculoskeletal: Normal range of motion.  Lymphadenopathy:  Head (right side): No submandibular adenopathy present.       Head (left side): No submandibular adenopathy present.    She has no cervical adenopathy.  Neurological: She is alert and oriented to person, place, and time. She has normal strength. No cranial nerve deficit or sensory deficit.  Skin: Skin is warm and dry.  Psychiatric: She has a normal mood and affect. Her speech is normal.   Nursing note and vitals reviewed.   ED Course  Procedures (including critical care time) Labs Review Labs Reviewed - No data to display  Imaging Review No results found. I have personally reviewed and evaluated these images and lab results as part of my medical decision-making.   EKG Interpretation None      MDM Vital signs within normal limits. The wet prep suggest bacterial vaginosis. GC and Chlamydia cultures as well as RPR are pending. Patient is treated with Flagyl. She is asked to refrain from sexual activity for the next 7 days. Patient acknowledges understanding of discharge plans.    Final diagnoses:  Bacterial vaginosis    *I have reviewed nursing notes, vital signs, and all appropriate lab and imaging results for this patient.**    Ivery Quale, PA-C 01/07/16 1537  Samuel Jester, DO 01/08/16 [phone]

## 2016-01-04 NOTE — ED Notes (Signed)
Called for room x 1. Friend in waiting room states she is outside. Will call for room at later time.

## 2016-01-04 NOTE — Discharge Instructions (Signed)
Your examination is consistent with bacterial vaginosis. The remainder of your lab tests should be available in about 3 days. [facility] if there are any abnormalities. Please use metronidazole 2 times daily with food. Please do not take this medication on an empty stomach. Please do not use any form of alcohol, not even mouthwash while taking this medication. Please refrain from all sexual activity over the next 7 days. Bacterial Vaginosis Bacterial vaginosis is an infection of the vagina. It happens when too many germs (bacteria) grow in the vagina. Having this infection puts you at risk for getting other infections from sex. Treating this infection can help lower your risk for other infections, such as:   Chlamydia.  Gonorrhea.  HIV.  Herpes. HOME CARE  Take your medicine as told by your doctor.  Finish your medicine even if you start to feel better.  Tell your sex partner that you have an infection. They should see their doctor for treatment.  During treatment:  Avoid sex or use condoms correctly.  Do not douche.  Do not drink alcohol unless your doctor tells you it is ok.  Do not breastfeed unless your doctor tells you it is ok. GET HELP IF:  You are not getting better after 3 days of treatment.  You have more grey fluid (discharge) coming from your vagina than before.  You have more pain than before.  You have a fever. MAKE SURE YOU:   Understand these instructions.  Will watch your condition.  Will get help right away if you are not doing well or get worse.   This information is not intended to replace advice given to you by your health care provider. Make sure you discuss any questions you have with your health care provider.   Document Released: 07/28/2008 Document Revised: 11/09/2014 Document Reviewed: 05/31/2013 Elsevier Interactive Patient Education [employer]2016 Elsevier Inc.

## 2016-01-06 LAB — GC/CHLAMYDIA PROBE AMP (~~LOC~~) NOT AT ARMC
CHLAMYDIA, DNA PROBE: NEGATIVE
NEISSERIA GONORRHEA: NEGATIVE

## 2016-01-06 LAB — RPR: RPR Ser Ql: NONREACTIVE

## 2016-01-06 LAB — HIV ANTIBODY (ROUTINE TESTING W REFLEX): HIV SCREEN 4TH GENERATION: NONREACTIVE

## 2016-01-07 ENCOUNTER — Encounter: Payer: BLUE CROSS/BLUE SHIELD | Admitting: Adult Health

## 2016-01-13 ENCOUNTER — Encounter (HOSPITAL_COMMUNITY): Payer: Self-pay | Admitting: *Deleted

## 2016-01-13 ENCOUNTER — Emergency Department (HOSPITAL_COMMUNITY)
Admission: EM | Admit: 2016-01-13 | Discharge: 2016-01-13 | Disposition: A | Discharge status: Home / Self Care | Payer: BLUE CROSS/BLUE SHIELD | Attending: Dermatology | Admitting: Dermatology

## 2016-01-13 ENCOUNTER — Encounter: Payer: BLUE CROSS/BLUE SHIELD | Admitting: Adult Health

## 2016-01-13 DIAGNOSIS — Z5321 Procedure and treatment not carried out due to patient leaving prior to being seen by health care provider: Secondary | ICD-10-CM | POA: Diagnosis not present

## 2016-01-13 DIAGNOSIS — L292 Pruritus vulvae: Secondary | ICD-10-CM | POA: Diagnosis present

## 2016-01-13 NOTE — ED Notes (Signed)
Pt seen here x 1 week for bacterial vaginosis and finished her antibiotics and pt states she woke up this am with itching

## 2016-01-13 NOTE — ED Notes (Signed)
Pt left facility per registration. 

## 2016-01-16 ENCOUNTER — Encounter: Payer: Self-pay | Admitting: Obstetrics & Gynecology

## 2016-01-16 ENCOUNTER — Ambulatory Visit (INDEPENDENT_AMBULATORY_CARE_PROVIDER_SITE_OTHER): Payer: BLUE CROSS/BLUE SHIELD | Admitting: Obstetrics & Gynecology

## 2016-01-16 VITALS — BP 110/70 | HR 72 | Wt 151.0 lb

## 2016-01-16 DIAGNOSIS — B373 Candidiasis of vulva and vagina: Secondary | ICD-10-CM

## 2016-01-16 DIAGNOSIS — B3731 Acute candidiasis of vulva and vagina: Secondary | ICD-10-CM

## 2016-01-16 MED ORDER — TERCONAZOLE 0.4 % VA CREA: 1.0000 | TOPICAL_CREAM | Freq: Every day | VAGINAL | Status: DC

## 2016-01-16 NOTE — Progress Notes (Signed)
Patient ID: Lauren Sherman, female   DOB: 11/23/1995, 20 y.o.   MRN: [id number]       Chief Complaint  Patient presents with  . gyn visit    vaginal itching and discharge    Blood pressure 110/70, pulse 72, weight 151 lb (68.493 kg), last menstrual period 01/03/2016.  19 y.o. G0P0000 Patient's last menstrual period was 01/03/2016. The current method of family planning is .  Subjective Vaginal discharge for 1weeks Itching yes Irritation yes Odor no Similar to previous yes  Previous treatment   Objective Vulva:  normal appearing vulva with no masses, tenderness or lesions Vagina:  normal mucosa, curd-like discharge Cervix:  no lesions Uterus:   Adnexa: ovaries:,       Pertinent ROS   Labs or studies Wet Prep:   A sample of vaginal discharge was obtained from the posterior fornix using a cotton swab. 2 drops of saline were placed on a slide and the cotton swab was immersed in the saline. Microscopic evaluation was performed and results were as follows:  Positive  for yeast  Negative for clue cells , consistent with Bacterial vaginosis Negative for trichomonas  Normal WBC population   Whiff test: Negative     Impression Diagnoses this Encounter::   ICD-9-CM ICD-10-CM   1. Yeast vaginitis 112.1 B37.3     Established relevant diagnosis(es):   Plan/Recommendations: Meds ordered this encounter  Medications  . terconazole (TERAZOL 7) 0.4 % vaginal cream    Sig: Place 1 applicator vaginally at bedtime.    Dispense:  45 g    Refill:  1    Labs or Scans Ordered: No orders of the defined types were placed in this encounter.    Management::   Follow up Return if symptoms worsen or fail to improve.       Face to face time:   minutes  Greater than 50% of the visit time was spent in counseling and coordination of care with the patient.  The summary and outline of the counseling and care coordination is summarized in the note above.   All questions  were answered.

## 2016-01-22 ENCOUNTER — Ambulatory Visit (INDEPENDENT_AMBULATORY_CARE_PROVIDER_SITE_OTHER): Payer: BLUE CROSS/BLUE SHIELD | Admitting: Adult Health

## 2016-01-22 ENCOUNTER — Other Ambulatory Visit: Payer: BLUE CROSS/BLUE SHIELD

## 2016-01-22 ENCOUNTER — Encounter: Payer: Self-pay | Admitting: Adult Health

## 2016-01-22 VITALS — BP 108/78 | HR 66 | Ht 63.0 in | Wt 161.0 lb

## 2016-01-22 DIAGNOSIS — Z30017 Encounter for initial prescription of implantable subdermal contraceptive: Secondary | ICD-10-CM | POA: Insufficient documentation

## 2016-01-22 DIAGNOSIS — Z3049 Encounter for surveillance of other contraceptives: Secondary | ICD-10-CM

## 2016-01-22 DIAGNOSIS — Z3046 Encounter for surveillance of implantable subdermal contraceptive: Secondary | ICD-10-CM | POA: Diagnosis not present

## 2016-01-22 DIAGNOSIS — Z3040 Encounter for surveillance of contraceptives, unspecified: Secondary | ICD-10-CM

## 2016-01-22 HISTORY — DX: Encounter for initial prescription of implantable subdermal contraceptive: Z30.017

## 2016-01-22 LAB — BETA HCG QUANT (REF LAB)

## 2016-01-22 NOTE — Patient Instructions (Signed)
Use condoms x 4 weeks, keep clean and dry x 24 hours, no heavy lifting, keep steri strips on x 72 hours, Keep pressure dressing on x 24 hours. Follow up prn problems. Return in 5 months for pap and physical

## 2016-01-22 NOTE — Progress Notes (Signed)
Subjective:     Patient ID: Lauren Sherman, female   DOB: 11/30/1995, 19 y.o.   MRN: [id number]  HPI [name] is a 20 year old biracial female in for nexplanon insertion.She had a stat QHCG this am.   Review of Systems For nexplanon insertion Reviewed past medical,surgical, social and family history. Reviewed medications and allergies.     Objective:   Physical Exam BP 108/78 mmHg  Pulse 66  Ht 5\' 3"  (1.6 m)  Wt 161 lb (73.029 kg)  BMI 28.53 kg/m2  LMP 01/03/2016 QHCG <1, Consent signed, time out called. Left arm cleansed with betadine, and injected with 1.5 cc 2% lidocaine and waited til numb. Nexplanon easily inserted and steri strips applied.Rod easily palpated by provider and pt. Pressure dressing applied.    Assessment:     Nexplanon insertion exp 5/19 lot [id number]    Plan:      Use condoms x 4 weeks, keep clean and dry x 24 hours, no heavy lifting, keep steri strips on x 72 hours, Keep pressure dressing on x 24 hours. Follow up prn problems.   Return in 5 months for pap and physical

## 2016-02-27 ENCOUNTER — Telehealth: Payer: Self-pay | Admitting: Adult Health

## 2016-02-27 ENCOUNTER — Other Ambulatory Visit: Payer: Self-pay | Admitting: Advanced Practice Midwife

## 2016-02-27 MED ORDER — MEGESTROL ACETATE 40 MG PO TABS: ORAL_TABLET | ORAL | Status: DC

## 2016-02-27 NOTE — Telephone Encounter (Signed)
Megace sent to pharmacy

## 2016-02-27 NOTE — Telephone Encounter (Signed)
Pt called stating that she had a Nexplanon placed and she has been bleeding ever since. Please contact pt

## 2016-02-27 NOTE — Progress Notes (Signed)
Megace sent for DUB/ 2/2 nexplanon

## 2016-02-27 NOTE — Telephone Encounter (Signed)
Spoke with pt. Pt got Nexplanon last month. Pt started bleeding 4/10 and is still bleeding. Light to medium flow. Pt is requesting something to help stop bleeding. Please advise. Thanks!! JSY

## 2016-03-02 ENCOUNTER — Emergency Department (HOSPITAL_COMMUNITY)
Admission: EM | Admit: 2016-03-02 | Discharge: 2016-03-02 | Disposition: A | Discharge status: Home / Self Care | Payer: BLUE CROSS/BLUE SHIELD | Attending: Emergency Medicine | Admitting: Emergency Medicine

## 2016-03-02 ENCOUNTER — Encounter (HOSPITAL_COMMUNITY): Payer: Self-pay | Admitting: Emergency Medicine

## 2016-03-02 DIAGNOSIS — Z79899 Other long term (current) drug therapy: Secondary | ICD-10-CM | POA: Insufficient documentation

## 2016-03-02 DIAGNOSIS — J029 Acute pharyngitis, unspecified: Secondary | ICD-10-CM | POA: Diagnosis not present

## 2016-03-02 LAB — RAPID STREP SCREEN (MED CTR MEBANE ONLY): STREPTOCOCCUS, GROUP A SCREEN (DIRECT): NEGATIVE

## 2016-03-02 MED ORDER — ACETAMINOPHEN 160 MG/5ML PO SOLN
650.0000 mg | Freq: Once | ORAL | Status: AC
  Administered 2016-03-02: 650 mg via ORAL
  Filled 2016-03-02: qty 20.3

## 2016-03-02 MED ORDER — IBUPROFEN 100 MG/5ML PO SUSP
400.0000 mg | Freq: Once | ORAL | Status: AC
  Administered 2016-03-02: 400 mg via ORAL
  Filled 2016-03-02: qty 20

## 2016-03-02 NOTE — ED Provider Notes (Signed)
CSN: [id number]     Arrival date & time 03/02/16  1422 History  By signing my name below, I, Tanda Rockers, attest that this documentation has been prepared under the direction and in the presence of Ivery Quale, PA-C.  Electronically Signed: Tanda Rockers, ED Scribe. 03/02/2016. 3:07 PM.   Chief Complaint  Patient presents with  . Sore Throat   Patient is a 20 y.o. female presenting with pharyngitis. The history is provided by the patient. No language interpreter was used.  Sore Throat This is a new problem. The current episode started more than 2 days ago. The problem occurs rarely. The problem has not changed since onset.Pertinent negatives include no chest pain, no abdominal pain, no headaches and no shortness of breath. The symptoms are aggravated by swallowing. Nothing relieves the symptoms. She has tried nothing for the symptoms. The treatment provided no relief.     HPI Comments: Lauren Sherman is a 20 y.o. female who presents to the Emergency Department complaining of gradual onset, constant, sore throat x 4 days. She is able to tolerate her own secretions. Pt also complains of left ear pain. Denies rash, fever, or any other associated symptoms. No previous surgeries involving the throat. No hx of being immunocompromised.    Past Medical History  Diagnosis Date  . Encounter for education about contraceptive use 12/17/2015  . Nexplanon insertion 01/22/2016    Inserted left arm 01/22/16   History reviewed. No pertinent past surgical history. Family History  Problem Relation Age of Onset  . Cancer Other   . Cancer Paternal Grandmother     breast   Social History  Substance Use Topics  . Smoking status: Never Smoker   . Smokeless tobacco: Never Used  . Alcohol Use: Yes     Comment: occ   OB History    Gravida Para Term Preterm AB TAB SAB Ectopic Multiple Living       Review of Systems  Constitutional: Negative for fever.  HENT: Positive for ear pain  (left) and sore throat. Negative for trouble swallowing.   Respiratory: Negative for shortness of breath.   Cardiovascular: Negative for chest pain.  Gastrointestinal: Negative for abdominal pain.  Skin: Negative for rash.  Neurological: Negative for headaches.  All other systems reviewed and are negative.  Allergies  Cherry and Peanut-containing drug products  Home Medications   Prior to Admission medications   Medication Sig Start Date End Date Taking? Authorizing Provider  albuterol (PROVENTIL HFA;VENTOLIN HFA) 108 (90 BASE) MCG/ACT inhaler Inhale 1-2 puffs into the lungs every 6 (six) hours as needed for wheezing or shortness of breath. Reported on 01/16/2016    Historical Provider, MD  etonogestrel (NEXPLANON) 68 MG IMPL implant 1 each by Subdermal route once.    Historical Provider, MD  megestrol (MEGACE) 40 MG tablet Take 3/day (at the same time) for 5 days; 2/day for 5 days, then 1/day PO prn bleeding 02/27/16   Jacklyn Shell, CNM  terconazole (TERAZOL 7) 0.4 % vaginal cream Place 1 applicator vaginally at bedtime. 01/16/16   Lazaro Arms, MD   BP 120/72 mmHg  Pulse 75  Temp(Src) 98.6 F (37 C) (Oral)  Resp 18  Ht  (1.6 m)  Wt 150 lb (68.04 kg)  BMI 26.58 kg/m2  SpO2 100%  LMP 02/16/2016   Physical Exam  Constitutional: She is oriented to person, place, and time. She appears well-developed and well-nourished. No distress.  HENT:  Head: Normocephalic and atraumatic.  Right Ear: Tympanic membrane normal.  Left Ear: Tympanic membrane normal.  Mouth/Throat: Uvula is midline. Posterior oropharyngeal erythema present.  Increased redness of the tonsils with multiple craters. Uvula in enlarged but midline.   Eyes: Conjunctivae and EOM are normal.  Neck: Normal range of motion. Neck supple. No tracheal deviation present.  Cardiovascular: Normal rate, regular rhythm, normal heart sounds and intact distal pulses.   Pulmonary/Chest: Effort normal and breath sounds  normal. No respiratory distress. She has no wheezes. She has no rales.  Musculoskeletal: Normal range of motion.  Neurological: She is alert and oriented to person, place, and time.  Skin: Skin is warm and dry. No rash noted.  Psychiatric: She has a normal mood and affect. Her behavior is normal.  Nursing note and vitals reviewed.   ED Course  Procedures (including critical care time)  DIAGNOSTIC STUDIES: Oxygen Saturation is 100% on RA, normal by my interpretation.    COORDINATION OF CARE: 3:07 PM-Discussed treatment plan which includes rapid strep test with pt at bedside and pt agreed to plan.   Labs Review Labs Reviewed  RAPID STREP SCREEN (NOT AT [facility])    Imaging Review No results found. I have personally reviewed and evaluated these lab results as part of my medical decision-making.   EKG Interpretation None      MDM Strep test is negative. Pt will use salt water gargles and chloraseptic Pt will use tylenol or ibuprofen for soreness. Questions answered.   Final diagnoses:  None    **I personally performed the services described in this documentation, which was scribed in my presence. The recorded information has been reviewed and is accurate. I have reviewed nursing notes, vital signs, and all appropriate lab and imaging results for this patient.Ivery Quale*     Saryn Cherry, PA-C 03/02/16 1659  Eber HongBrian Miller, MD 03/05/16 1001

## 2016-03-02 NOTE — Discharge Instructions (Signed)
Your strep screen is negative. Please use salt water gargles and Chloraseptic gargles 3-4 times daily. Use Tylenol every 4 hours, or ibuprofen every 6 hours for discomfort. We do not allow anyone to share your eating utensils. Please keep your distance from others. Please see your primary physician, or return to the emergency department if not improving. Pharyngitis Pharyngitis is a sore throat (pharynx). There is redness, pain, and swelling of your throat. HOME CARE   Drink enough fluids to keep your pee (urine) clear or pale yellow.  Only take medicine as told by your doctor.  You may get sick again if you do not take medicine as told. Finish your medicines, even if you start to feel better.  Do not take aspirin.  Rest.  Rinse your mouth (gargle) with salt water ( tsp of salt per 1 qt of water) every 1-2 hours. This will help the pain.  If you are not at risk for choking, you can suck on hard candy or sore throat lozenges. GET HELP IF:  You have large, tender lumps on your neck.  You have a rash.  You cough up green, yellow-brown, or bloody spit. GET HELP RIGHT AWAY IF:   You have a stiff neck.  You drool or cannot swallow liquids.  You throw up (vomit) or are not able to keep medicine or liquids down.  You have very bad pain that does not go away with medicine.  You have problems breathing (not from a stuffy nose). MAKE SURE YOU:   Understand these instructions.  Will watch your condition.  Will get help right away if you are not doing well or get worse.   This information is not intended to replace advice given to you by your health care provider. Make sure you discuss any questions you have with your health care provider.   Document Released: 04/06/2008 Document Revised: 08/09/2013 Document Reviewed: 06/26/2013 Elsevier Interactive Patient Education [employer]2016 Elsevier Inc.

## 2016-03-02 NOTE — ED Notes (Signed)
Pt reports sore throat and HA since Friday. Pt also reports LT ear otalgia.

## 2016-03-05 LAB — CULTURE, GROUP A STREP (THRC)

## 2016-03-12 ENCOUNTER — Emergency Department (HOSPITAL_COMMUNITY)
Admission: EM | Admit: 2016-03-12 | Discharge: 2016-03-12 | Disposition: A | Discharge status: Home / Self Care | Payer: BLUE CROSS/BLUE SHIELD | Attending: Emergency Medicine | Admitting: Emergency Medicine

## 2016-03-12 ENCOUNTER — Encounter (HOSPITAL_COMMUNITY): Payer: Self-pay | Admitting: Emergency Medicine

## 2016-03-12 DIAGNOSIS — J0110 Acute frontal sinusitis, unspecified: Secondary | ICD-10-CM | POA: Insufficient documentation

## 2016-03-12 DIAGNOSIS — J029 Acute pharyngitis, unspecified: Secondary | ICD-10-CM | POA: Diagnosis present

## 2016-03-12 DIAGNOSIS — R509 Fever, unspecified: Secondary | ICD-10-CM | POA: Diagnosis not present

## 2016-03-12 MED ORDER — AMOXICILLIN 500 MG PO CAPS: 500.0000 mg | ORAL_CAPSULE | Freq: Three times a day (TID) | ORAL | Status: AC

## 2016-03-12 NOTE — Discharge Instructions (Signed)

## 2016-03-12 NOTE — ED Notes (Signed)
Warm blanket provided at this time. No other needs voiced

## 2016-03-12 NOTE — ED Notes (Signed)
Pt reports seen for same a few days ago. Pt reports has been taking ibuprofen with no relief. Pt reports continued cough,sore throat. Pt also reports left eye drainage x2 days. nad noted .

## 2016-03-12 NOTE — ED Notes (Signed)
Patient verbalizes understanding of discharge instructions, prescriptions, home care and follow up care. Patient out of department at this time. 

## 2016-03-14 NOTE — ED Provider Notes (Signed)
CSN: [id number]     Arrival date & time 03/12/16  1204 History   First MD Initiated Contact with Patient 03/12/16 1259     Chief Complaint  Patient presents with  . Sore Throat     (Consider location/radiation/quality/duration/timing/severity/associated sxs/prior Treatment) The history is provided by the patient.   Lauren Sherman is a 20 y.o. female presenting with a 2 day history of uri type symptoms which includes nasal congestion with thick colored rhinorrhea, sore throat,  nonproductive cough sinus pain and pressure with chronic clear tearing from her left eye. She endorses subjective fever.  Symptoms do not include shortness of breath, chest pain,  Nausea, vomiting or diarrhea. She was seen here for similar symptoms  10 days ago and had not had improvement despite warm salt gargles and ibuprofen.     Past Medical History  Diagnosis Date  . Encounter for education about contraceptive use 12/17/2015  . Nexplanon insertion 01/22/2016    Inserted left arm 01/22/16   History reviewed. No pertinent past surgical history. Family History  Problem Relation Age of Onset  . Cancer Other   . Cancer Paternal Grandmother     breast   Social History  Substance Use Topics  . Smoking status: Never Smoker   . Smokeless tobacco: Never Used  . Alcohol Use: Yes     Comment: occ   OB History    Gravida Para Term Preterm AB TAB SAB Ectopic Multiple Living   0 0 0 0 0 0 0 0 0 0      Review of Systems  Constitutional: Positive for fever. Negative for chills.  HENT: Positive for congestion, rhinorrhea, sinus pressure and sore throat. Negative for ear pain, facial swelling, trouble swallowing and voice change.   Eyes: Negative for discharge.  Respiratory: Positive for cough. Negative for shortness of breath, wheezing and stridor.   Cardiovascular: Negative for chest pain.  Gastrointestinal: Negative for abdominal pain.  Genitourinary: Negative.       Allergies  Cherry and  Peanut-containing drug products  Home Medications   Prior to Admission medications   Medication Sig Start Date End Date Taking? Authorizing Provider  amoxicillin (AMOXIL) 500 MG capsule Take 1 capsule (500 mg total) by mouth 3 (three) times daily. 03/12/16 03/22/16  Burgess AmorJulie Eilan Mcinerny, PA-C  diphenhydrAMINE (BENADRYL) 25 mg capsule Take 25 mg by mouth every 6 (six) hours as needed.    Historical Provider, MD  etonogestrel (NEXPLANON) 68 MG IMPL implant 1 each by Subdermal route once.    Historical Provider, MD  megestrol (MEGACE) 40 MG tablet Take 3/day (at the same time) for 5 days; 2/day for 5 days, then 1/day PO prn bleeding 02/27/16   Jacklyn ShellFrances Cresenzo-Dishmon, CNM  terconazole (TERAZOL 7) 0.4 % vaginal cream Place 1 applicator vaginally at bedtime. Patient not taking: Reported on 03/02/2016 01/16/16   Lazaro ArmsLuther H Eure, MD   BP 109/81 mmHg  Pulse 67  Temp(Src) 98.2 F (36.8 C) (Oral)  Resp 18  Ht 5\' 3"  (1.6 m)  Wt 68.04 kg  BMI 26.58 kg/m2  SpO2 100%  LMP 02/16/2016 Physical Exam  Constitutional: She is oriented to person, place, and time. She appears well-developed and well-nourished.  HENT:  Head: Normocephalic and atraumatic.  Right Ear: Tympanic membrane and ear canal normal.  Left Ear: Tympanic membrane and ear canal normal.  Nose: Mucosal edema and rhinorrhea present. Left sinus exhibits frontal sinus tenderness.  Mouth/Throat: Uvula is midline, oropharynx is clear and moist and mucous membranes are normal.  No oropharyngeal exudate, posterior oropharyngeal edema, posterior oropharyngeal erythema or tonsillar abscesses.  Eyes: Conjunctivae are normal.  Cardiovascular: Normal rate and normal heart sounds.   Pulmonary/Chest: Effort normal. No respiratory distress. She has no wheezes. She has no rales.  Musculoskeletal: Normal range of motion.  Neurological: She is alert and oriented to person, place, and time.  Skin: Skin is warm and dry. No rash noted.  Psychiatric: She has a normal mood  and affect.    ED Course  Procedures (including critical care time) Labs Review Labs Reviewed - No data to display  Imaging Review No results found. I have personally reviewed and evaluated these images and lab results as part of my medical decision-making.   EKG Interpretation None      MDM   Final diagnoses:  Acute frontal sinusitis, recurrence not specified    Pt prescribed amoxil, advised continued ibuprofen, sudafed, vicks, menthol cough drops/ steam tx as helpful tx for relief of congestion and pain.  Advised f/u if sx persist, referrals given for establishing pcp.     Burgess Amor, PA-C 03/14/16 1548  Vanetta Mulders, MD 03/17/16 [phone]

## 2016-04-12 ENCOUNTER — Encounter (HOSPITAL_COMMUNITY): Payer: Self-pay | Admitting: *Deleted

## 2016-04-12 ENCOUNTER — Emergency Department (HOSPITAL_COMMUNITY)
Admission: EM | Admit: 2016-04-12 | Discharge: 2016-04-12 | Disposition: A | Discharge status: Home / Self Care | Payer: BLUE CROSS/BLUE SHIELD | Attending: Emergency Medicine | Admitting: Emergency Medicine

## 2016-04-12 DIAGNOSIS — G43909 Migraine, unspecified, not intractable, without status migrainosus: Secondary | ICD-10-CM | POA: Diagnosis not present

## 2016-04-12 DIAGNOSIS — R51 Headache: Secondary | ICD-10-CM | POA: Diagnosis present

## 2016-04-12 NOTE — ED Notes (Signed)
Pt is neuro intact, without pain now, non photophobic and reports does not respond to her prescription motrin- she denies having a physician and when asked who prescribed her meds, reports that the med in about 21 year old- she is playing on her phone and has snacks in the chair

## 2016-04-12 NOTE — Discharge Instructions (Signed)
Migraine Headache  A migraine headache is very bad, throbbing pain on one or both sides of your head. Talk to your doctor about what things may bring on (trigger) your migraine headaches.  HOME CARE  · Only take medicines as told by your doctor.  · Lie down in a dark, quiet room when you have a migraine.  · Keep a journal to find out if certain things bring on migraine headaches. For example, write down:    What you eat and drink.    How much sleep you get.    Any change to your diet or medicines.  · Lessen how much alcohol you drink.  · Quit smoking if you smoke.  · Get enough sleep.  · Lessen any stress in your life.  · Keep lights dim if bright lights bother you or make your migraines worse.  GET HELP RIGHT AWAY IF:   · Your migraine becomes really bad.  · You have a fever.  · You have a stiff neck.  · You have trouble seeing.  · Your muscles are weak, or you lose muscle control.  · You lose your balance or have trouble walking.  · You feel like you will pass out (faint), or you pass out.  · You have really bad symptoms that are different than your first symptoms.  MAKE SURE YOU:   · Understand these instructions.  · Will watch your condition.  · Will get help right away if you are not doing well or get worse.     This information is not intended to replace advice given to you by your health care provider. Make sure you discuss any questions you have with your health care provider.     Document Released: 07/28/2008 Document Revised: 01/11/2012 Document Reviewed: 06/26/2013  Elsevier Interactive Patient Education ©2016 Elsevier Inc.

## 2016-04-12 NOTE — ED Notes (Signed)
Pt reports having a headache off and on x 7 days. Pt states it wakes her up from her sleep. Pt states the pain is always on the front part of the left side of her head. Pt reports n/v since last night. Pt states the headache last anywhere from 15 minutes to 3 hours.

## 2016-04-12 NOTE — ED Provider Notes (Signed)
CSN: [id number]     Arrival date & time 04/12/16  2126 History   First MD Initiated Contact with Patient 04/12/16 2252     Chief Complaint  Patient presents with  . Headache     (Consider location/radiation/quality/duration/timing/severity/associated sxs/prior Treatment) HPI   Lauren Sherman is a 20 y.o. female who presents to the Emergency Department complaining of intermittent headache for 7 days.  She describes the headache as a sharp pain to the left side of her head that typically wakes her from sleep.  She reports associated photophobia.  She has not been taking any medications for her symptoms, she reports nausea and one episode of vomiting on the evening prior to arrival.  She denies fever, visual changes, neck pain or stiffness, fever, chills.  She reports intermittent headaches since having an Nexplanon implant placed    Past Medical History  Diagnosis Date  . Encounter for education about contraceptive use 12/17/2015  . Nexplanon insertion 01/22/2016    Inserted left arm 01/22/16   History reviewed. No pertinent past surgical history. Family History  Problem Relation Age of Onset  . Cancer Other   . Cancer Paternal Grandmother     breast   Social History  Substance Use Topics  . Smoking status: Never Smoker   . Smokeless tobacco: Never Used  . Alcohol Use: Yes     Comment: occ   OB History    Gravida Para Term Preterm AB TAB SAB Ectopic Multiple Living   0 0 0 0 0 0 0 0 0 0      Review of Systems  Constitutional: Negative for fever, activity change and appetite change.  HENT: Negative for facial swelling and trouble swallowing.   Eyes: Positive for photophobia. Negative for pain and visual disturbance.  Respiratory: Negative for shortness of breath.   Cardiovascular: Negative for chest pain.  Gastrointestinal: Negative for nausea and vomiting.  Musculoskeletal: Negative for neck pain and neck stiffness.  Skin: Negative for rash and wound.  Neurological:  Positive for headaches. Negative for dizziness, facial asymmetry, speech difficulty, weakness and numbness.  Psychiatric/Behavioral: Negative for confusion and decreased concentration.  All other systems reviewed and are negative.     Allergies  Cherry and Peanut-containing drug products  Home Medications   Prior to Admission medications   Medication Sig Start Date End Date Taking? Authorizing Provider  diphenhydrAMINE (BENADRYL) 25 mg capsule Take 25 mg by mouth every 6 (six) hours as needed.    Historical Provider, MD  etonogestrel (NEXPLANON) 68 MG IMPL implant 1 each by Subdermal route once.    Historical Provider, MD  megestrol (MEGACE) 40 MG tablet Take 3/day (at the same time) for 5 days; 2/day for 5 days, then 1/day PO prn bleeding 02/27/16   Jacklyn ShellFrances Cresenzo-Dishmon, CNM  terconazole (TERAZOL 7) 0.4 % vaginal cream Place 1 applicator vaginally at bedtime. Patient not taking: Reported on 03/02/2016 01/16/16   Lazaro ArmsLuther H Eure, MD   BP 125/78 mmHg  Pulse 71  Temp(Src) 98.5 F (36.9 C) (Oral)  Resp 16  Ht 5\' 3"  (1.6 m)  Wt 68.04 kg  BMI 26.58 kg/m2  SpO2 100%  LMP 04/09/2016 Physical Exam  Constitutional: She is oriented to person, place, and time. She appears well-developed and well-nourished. No distress.  HENT:  Head: Normocephalic and atraumatic.  Mouth/Throat: Oropharynx is clear and moist.  Eyes: EOM are normal. Pupils are equal, round, and reactive to light.  Neck: Normal range of motion and phonation normal. Neck supple. No  spinous process tenderness and no muscular tenderness present. No rigidity. No Brudzinski's sign and no Kernig's sign noted.  Cardiovascular: Normal rate, regular rhythm, normal heart sounds and intact distal pulses.   No murmur heard. Pulmonary/Chest: Effort normal and breath sounds normal. No respiratory distress.  Musculoskeletal: Normal range of motion.  Neurological: She is alert and oriented to person, place, and time. She has normal strength.  No cranial nerve deficit or sensory deficit. She exhibits normal muscle tone. Coordination and gait normal. GCS eye subscore is 4. GCS verbal subscore is 5. GCS motor subscore is 6.  Reflex Scores:      Tricep reflexes are 2+ on the right side and 2+ on the left side.      Bicep reflexes are 2+ on the right side and 2+ on the left side. Skin: Skin is warm and dry.  Psychiatric: She has a normal mood and affect.  Nursing note and vitals reviewed.   ED Course  Procedures (including critical care time) Labs Review Labs Reviewed - No data to display  Imaging Review No results found. I have personally reviewed and evaluated these images and lab results as part of my medical decision-making.   EKG Interpretation None      MDM   Final diagnoses:  Migraine without status migrainosus, not intractable, unspecified migraine type    Pt is resting, non-toxic appearing.  No nuchal rigidity.  Headache has resolved at present. Patient eating chips and drinking water.  Appears stable for d/c.  Return precautions given.     Pauline Aus, PA-C 04/16/16 0049  Bethann Berkshire, MD 04/22/16 [phone]

## 2016-05-03 ENCOUNTER — Emergency Department (HOSPITAL_COMMUNITY)
Admission: EM | Admit: 2016-05-03 | Discharge: 2016-05-03 | Disposition: A | Discharge status: Home / Self Care | Payer: BLUE CROSS/BLUE SHIELD | Attending: Emergency Medicine | Admitting: Emergency Medicine

## 2016-05-03 ENCOUNTER — Encounter (HOSPITAL_COMMUNITY): Payer: Self-pay

## 2016-05-03 DIAGNOSIS — S91114A Laceration without foreign body of right lesser toe(s) without damage to nail, initial encounter: Secondary | ICD-10-CM | POA: Diagnosis not present

## 2016-05-03 DIAGNOSIS — Y999 Unspecified external cause status: Secondary | ICD-10-CM | POA: Diagnosis not present

## 2016-05-03 DIAGNOSIS — Y929 Unspecified place or not applicable: Secondary | ICD-10-CM | POA: Diagnosis not present

## 2016-05-03 DIAGNOSIS — Y939 Activity, unspecified: Secondary | ICD-10-CM | POA: Diagnosis not present

## 2016-05-03 DIAGNOSIS — W268XXA Contact with other sharp object(s), not elsewhere classified, initial encounter: Secondary | ICD-10-CM | POA: Insufficient documentation

## 2016-05-03 DIAGNOSIS — S91119A Laceration without foreign body of unspecified toe without damage to nail, initial encounter: Secondary | ICD-10-CM

## 2016-05-03 MED ORDER — BACITRACIN ZINC 500 UNIT/GM EX OINT
1.0000 "application " | TOPICAL_OINTMENT | Freq: Two times a day (BID) | CUTANEOUS | Status: DC
  Administered 2016-05-03: 1 via TOPICAL
  Filled 2016-05-03: qty 0.9

## 2016-05-03 MED ORDER — BACITRACIN ZINC 500 UNIT/GM EX OINT: 1.0000 "application " | TOPICAL_OINTMENT | Freq: Two times a day (BID) | CUTANEOUS | Status: DC

## 2016-05-03 MED ORDER — TETANUS-DIPHTH-ACELL PERTUSSIS 5-2.5-18.5 LF-MCG/0.5 IM SUSP
0.5000 mL | Freq: Once | INTRAMUSCULAR | Status: AC
  Administered 2016-05-03: 0.5 mL via INTRAMUSCULAR
  Filled 2016-05-03: qty 0.5

## 2016-05-03 MED ORDER — TETANUS-DIPHTH-ACELL PERTUSSIS 5-2.5-18.5 LF-MCG/0.5 IM SUSP
INTRAMUSCULAR | Status: AC
  Filled 2016-05-03: qty 0.5

## 2016-05-03 NOTE — ED Provider Notes (Signed)
CSN: [id number]     Arrival date & time 05/03/16  0251 History   First MD Initiated Contact with Patient 05/03/16 0302     Chief Complaint  Patient presents with  . Toe Injury     (Consider location/radiation/quality/duration/timing/severity/associated sxs/prior Treatment) HPI Comments: The patient is a 20 year old female who had acute onset of a toe laceration approximately 5 hours ago when she stepped on a razor on the floor. This is her right second toe, it is on the plantar surface, there was minimal bleeding, she is not up-to-date on tetanus as far she knows. Symptoms are persistent, mild, worse with palpation or ambulation  The history is provided by the patient.    Past Medical History  Diagnosis Date  . Encounter for education about contraceptive use 12/17/2015  . Nexplanon insertion 01/22/2016    Inserted left arm 01/22/16   History reviewed. No pertinent past surgical history. Family History  Problem Relation Age of Onset  . Cancer Other   . Cancer Paternal Grandmother     breast   Social History  Substance Use Topics  . Smoking status: Never Smoker   . Smokeless tobacco: Never Used  . Alcohol Use: Yes     Comment: occ   OB History    Gravida Para Term Preterm AB TAB SAB Ectopic Multiple Living   0 0 0 0 0 0 0 0 0 0      Review of Systems  Constitutional: Negative for fever.  Gastrointestinal: Negative for vomiting.  Skin: Positive for wound.       Laceration  Neurological: Negative for weakness and numbness.      Allergies  Cherry and Peanut-containing drug products  Home Medications   Prior to Admission medications   Medication Sig Start Date End Date Taking? Authorizing Provider  bacitracin ointment Apply 1 application topically 2 (two) times daily. 05/03/16   Eber HongBrian Kingston Shawgo, MD  diphenhydrAMINE (BENADRYL) 25 mg capsule Take 25 mg by mouth every 6 (six) hours as needed.    Historical Provider, MD  etonogestrel (NEXPLANON) 68 MG IMPL implant 1 each by  Subdermal route once.    Historical Provider, MD  megestrol (MEGACE) 40 MG tablet Take 3/day (at the same time) for 5 days; 2/day for 5 days, then 1/day PO prn bleeding 02/27/16   Jacklyn ShellFrances Cresenzo-Dishmon, CNM  terconazole (TERAZOL 7) 0.4 % vaginal cream Place 1 applicator vaginally at bedtime. Patient not taking: Reported on 03/02/2016 01/16/16   Lazaro ArmsLuther H Eure, MD   BP 116/66 mmHg  Pulse 77  Temp(Src) 98.3 F (36.8 C) (Oral)  Resp 16  Ht 5\' 3"  (1.6 m)  Wt 150 lb (68.04 kg)  BMI 26.58 kg/m2  SpO2 100%  LMP 04/09/2016 (Approximate) Physical Exam  Constitutional: She appears well-developed and well-nourished. No distress.  HENT:  Head: Normocephalic.  Eyes: Conjunctivae are normal. No scleral icterus.  Cardiovascular: Normal rate and regular rhythm.   Pulmonary/Chest: Effort normal and breath sounds normal.  Musculoskeletal: Normal range of motion. She exhibits tenderness ( ttp over the laceration site). She exhibits no edema.  Neurological: She is alert. Coordination normal.  Sensation and motor intact  Skin: Skin is warm and dry. She is not diaphoretic.  Subcentimeter fillet flap-type laceration to the plantar surface of the right second toe, no bleeding, unable to spread the wound apart, extremely superficial, avascular flap    ED Course  Procedures (including critical care time) Labs Review Labs Reviewed - No data to display  Imaging Review No results  found. I have personally reviewed and evaluated these images and lab results as part of my medical decision-making.    MDM   Final diagnoses:  Toe laceration, initial encounter    The patient has normal vital signs, she is not bleeding, she will receive local wound care, tetanus up-to-date, I have given her wound care instructions for home and she is expressed her understanding. No indication for imaging, no indication for sutures.    Eber HongBrian Alyson Ki, MD 05/03/16 [phone]

## 2016-05-03 NOTE — Discharge Instructions (Signed)

## 2016-05-03 NOTE — ED Notes (Signed)
Right 2nd toe laceration from razor.

## 2016-05-06 DIAGNOSIS — Z7689 Persons encountering health services in other specified circumstances: Secondary | ICD-10-CM | POA: Diagnosis not present

## 2016-05-06 DIAGNOSIS — H5462 Unqualified visual loss, left eye, normal vision right eye: Secondary | ICD-10-CM | POA: Diagnosis not present

## 2016-05-06 DIAGNOSIS — G43019 Migraine without aura, intractable, without status migrainosus: Secondary | ICD-10-CM | POA: Diagnosis not present

## 2016-05-15 ENCOUNTER — Encounter (HOSPITAL_COMMUNITY): Payer: Self-pay | Admitting: *Deleted

## 2016-05-15 ENCOUNTER — Emergency Department (HOSPITAL_COMMUNITY)
Admission: EM | Admit: 2016-05-15 | Discharge: 2016-05-15 | Disposition: A | Discharge status: Home / Self Care | Payer: BLUE CROSS/BLUE SHIELD | Attending: Emergency Medicine | Admitting: Emergency Medicine

## 2016-05-15 DIAGNOSIS — G43909 Migraine, unspecified, not intractable, without status migrainosus: Secondary | ICD-10-CM | POA: Insufficient documentation

## 2016-05-15 DIAGNOSIS — Z7982 Long term (current) use of aspirin: Secondary | ICD-10-CM | POA: Diagnosis not present

## 2016-05-15 DIAGNOSIS — Z79899 Other long term (current) drug therapy: Secondary | ICD-10-CM | POA: Insufficient documentation

## 2016-05-15 DIAGNOSIS — Z791 Long term (current) use of non-steroidal anti-inflammatories (NSAID): Secondary | ICD-10-CM | POA: Insufficient documentation

## 2016-05-15 DIAGNOSIS — R51 Headache: Secondary | ICD-10-CM | POA: Diagnosis present

## 2016-05-15 MED ORDER — KETOROLAC TROMETHAMINE 60 MG/2ML IM SOLN
60.0000 mg | Freq: Once | INTRAMUSCULAR | Status: AC
  Administered 2016-05-15: 60 mg via INTRAMUSCULAR
  Filled 2016-05-15: qty 2

## 2016-05-15 MED ORDER — METOCLOPRAMIDE HCL 5 MG/ML IJ SOLN
10.0000 mg | Freq: Once | INTRAMUSCULAR | Status: AC
  Administered 2016-05-15: 10 mg via INTRAMUSCULAR
  Filled 2016-05-15: qty 2

## 2016-05-15 MED ORDER — DIPHENHYDRAMINE HCL 25 MG PO CAPS
25.0000 mg | ORAL_CAPSULE | Freq: Once | ORAL | Status: AC
Start: 2016-05-15 — End: 2016-05-15
  Administered 2016-05-15: 25 mg via ORAL
  Filled 2016-05-15: qty 1

## 2016-05-15 NOTE — ED Provider Notes (Signed)
CSN: [id number]     Arrival date & time 05/15/16  2153 History   First MD Initiated Contact with Patient 05/15/16 2218     Chief Complaint  Patient presents with  . Migraine     (Consider location/radiation/quality/duration/timing/severity/associated sxs/prior Treatment) HPI   Lauren Sherman is a 20 y.o. female who presents to the Emergency Department complaining of gradual onset of headache around 5 PM today. She describes a throbbing sensation to the left temple and forehead.  She reports history of recurrent migraine headaches states pain is similar to previous. She also notes some photophobia and tearing of the left eye without visual change. She has not tried any medications for symptom relief on this occasion, but has tried ibuprofen in the past which did not help.  She denies visual change, fever, chills, neck pain or stiffness, numbness or weakness of the extremities and vomiting.    Past Medical History  Diagnosis Date  . Encounter for education about contraceptive use 12/17/2015  . Nexplanon insertion 01/22/2016    Inserted left arm 01/22/16   History reviewed. No pertinent past surgical history. Family History  Problem Relation Age of Onset  . Cancer Other   . Cancer Paternal Grandmother     breast   Social History  Substance Use Topics  . Smoking status: Never Smoker   . Smokeless tobacco: Never Used  . Alcohol Use: Yes     Comment: occ   OB History    Gravida Para Term Preterm AB TAB SAB Ectopic Multiple Living   0 0 0 0 0 0 0 0 0 0      Review of Systems  Constitutional: Negative for fever, activity change and appetite change.  HENT: Negative for facial swelling and trouble swallowing.   Eyes: Positive for photophobia. Negative for pain and visual disturbance.  Respiratory: Negative for shortness of breath.   Cardiovascular: Negative for chest pain.  Gastrointestinal: Negative for nausea and vomiting.  Musculoskeletal: Negative for neck pain and neck  stiffness.  Skin: Negative for rash and wound.  Neurological: Positive for headaches. Negative for dizziness, facial asymmetry, speech difficulty, weakness and numbness.  Psychiatric/Behavioral: Negative for confusion and decreased concentration.  All other systems reviewed and are negative.     Allergies  Cherry and Peanut-containing drug products  Home Medications   Prior to Admission medications   Medication Sig Start Date End Date Taking? Authorizing Provider  acetaminophen (TYLENOL) 500 MG tablet Take 1,000-1,500 mg by mouth every 6 (six) hours as needed for moderate pain.   Yes Historical Provider, MD  aspirin-acetaminophen-caffeine (EXCEDRIN MIGRAINE) [id number] MG tablet Take 2 tablets by mouth every 6 (six) hours as needed for headache.   Yes Historical Provider, MD  diphenhydrAMINE (BENADRYL) 25 mg capsule Take 25 mg by mouth every 6 (six) hours as needed.   Yes Historical Provider, MD  etonogestrel (NEXPLANON) 68 MG IMPL implant 1 each by Subdermal route once.   Yes Historical Provider, MD  ibuprofen (ADVIL,MOTRIN) 200 MG tablet Take 400-600 mg by mouth every 6 (six) hours as needed for moderate pain.   Yes Historical Provider, MD  megestrol (MEGACE) 40 MG tablet Take 3/day (at the same time) for 5 days; 2/day for 5 days, then 1/day PO prn bleeding 02/27/16  Yes Jacklyn ShellFrances Cresenzo-Dishmon, CNM  naproxen sodium (ANAPROX) 220 MG tablet Take 440 mg by mouth daily as needed (pain).   Yes Historical Provider, MD  bacitracin ointment Apply 1 application topically 2 (two) times daily. Patient not taking:  Reported on 05/15/2016 05/03/16   Eber Hong, MD  terconazole (TERAZOL 7) 0.4 % vaginal cream Place 1 applicator vaginally at bedtime. Patient not taking: Reported on 03/02/2016 01/16/16   Lazaro Arms, MD   BP 114/79 mmHg  Pulse 64  Temp(Src) 98.1 F (36.7 C) (Oral)  Resp 18  Ht  (1.6 m)  Wt 69.854 kg  BMI 27.29 kg/m2  SpO2 100%  LMP 04/09/2016 (Approximate) Physical Exam   Constitutional: She is oriented to person, place, and time. She appears well-developed and well-nourished. No distress.  HENT:  Head: Normocephalic and atraumatic.  Mouth/Throat: Oropharynx is clear and moist.  Eyes: EOM are normal. Pupils are equal, round, and reactive to light.  Neck: Normal range of motion, full passive range of motion without pain and phonation normal. Neck supple. No spinous process tenderness and no muscular tenderness present. No rigidity. No Kernig's sign noted.  Cardiovascular: Normal rate, regular rhythm, normal heart sounds and intact distal pulses.   No murmur heard. Pulmonary/Chest: Effort normal and breath sounds normal. No respiratory distress.  Musculoskeletal: Normal range of motion.  Neurological: She is alert and oriented to person, place, and time. She has normal strength. No cranial nerve deficit or sensory deficit. She exhibits normal muscle tone. Coordination and gait normal. GCS eye subscore is 4. GCS verbal subscore is 5. GCS motor subscore is 6.  Reflex Scores:      Tricep reflexes are 2+ on the right side and 2+ on the left side.      Bicep reflexes are 2+ on the right side and 2+ on the left side. Skin: Skin is warm and dry.  Psychiatric: She has a normal mood and affect.  Nursing note and vitals reviewed.   ED Course  Procedures (including critical care time) Labs Review Labs Reviewed - No data to display  Imaging Review No results found. I have personally reviewed and evaluated these images and lab results as part of my medical decision-making.   EKG Interpretation None      MDM   Final diagnoses:  Migraine without status migrainosus, not intractable, unspecified migraine type    Patient well-appearing. No nuchal rigidity. Vital signs are stable. Headache is recurrent and similar to previous  2330 Patient sitting upright on the stretcher talking with friend at bedside. she reports feet that she is feeling much better and  headache has resolved. Pt's father is arranging an appointment with a neurologist. Appears stable for discharge   Rosey Bath 05/15/16 2339  Donnetta Hutching, MD 05/16/16 1820

## 2016-05-15 NOTE — Discharge Instructions (Signed)
Recurrent Migraine Headache A migraine headache is an intense, throbbing pain on one or both sides of your head. Recurrent migraines keep coming back. A migraine can last for 30 minutes to several hours. CAUSES  The exact cause of a migraine headache is not always known. However, a migraine may be caused when nerves in the brain become irritated and release chemicals that cause inflammation. This causes pain. Certain things may also trigger migraines, such as:   Alcohol.  Smoking.  Stress.  Menstruation.  Aged cheeses.  Foods or drinks that contain nitrates, glutamate, aspartame, or tyramine.  Lack of sleep.  Chocolate.  Caffeine.  Hunger.  Physical exertion.  Fatigue.  Medicines used to treat chest pain (nitroglycerine), birth control pills, estrogen, and some blood pressure medicines. SYMPTOMS   Pain on one or both sides of your head.  Pulsating or throbbing pain.  Severe pain that prevents daily activities.  Pain that is aggravated by any physical activity.  Nausea, vomiting, or both.  Dizziness.  Pain with exposure to bright lights, loud noises, or activity.  General sensitivity to bright lights, loud noises, or smells. Before you get a migraine, you may get warning signs that a migraine is coming (aura). An aura may include:  Seeing flashing lights.  Seeing bright spots, halos, or zigzag lines.  Having tunnel vision or blurred vision.  Having feelings of numbness or tingling.  Having trouble talking.  Having muscle weakness. DIAGNOSIS  A recurrent migraine headache is often diagnosed based on:  Symptoms.  Physical examination.  A CT scan or MRI of your head. These imaging tests cannot diagnose migraines but can help rule out other causes of headaches.  TREATMENT  Medicines may be given for pain and nausea. Medicines can also be given to help prevent recurrent migraines. HOME CARE INSTRUCTIONS  Only take over-the-counter or prescription  medicines for pain or discomfort as directed by your health care provider. The use of long-term narcotics is not recommended.  Lie down in a dark, quiet room when you have a migraine.  Keep a journal to find out what may trigger your migraine headaches. For example, write down:  What you eat and drink.  How much sleep you get.  Any change to your diet or medicines.  Limit alcohol consumption.  Quit smoking if you smoke.  Get 7-9 hours of sleep, or as recommended by your health care provider.  Limit stress.  Keep lights dim if bright lights bother you and make your migraines worse. SEEK MEDICAL CARE IF:   You do not get relief from the medicines given to you.  You have a recurrence of pain.  You have a fever. SEEK IMMEDIATE MEDICAL CARE IF:  Your migraine becomes severe.  You have a stiff neck.  You have loss of vision.  You have muscular weakness or loss of muscle control.  You start losing your balance or have trouble walking.  You feel faint or pass out.  You have severe symptoms that are different from your first symptoms. MAKE SURE YOU:   Understand these instructions.  Will watch your condition.  Will get help right away if you are not doing well or get worse.   This information is not intended to replace advice given to you by your health care provider. Make sure you discuss any questions you have with your health care provider.   Document Released: 07/14/2001 Document Revised: 11/09/2014 Document Reviewed: 06/26/2013 Elsevier Interactive Patient Education 2016 Elsevier Inc.  

## 2016-05-15 NOTE — ED Notes (Signed)
Pt states she woke up with a headache around 5pm. Pt reports continued pain. Pt hasn't taken anything for pain.

## 2016-06-19 ENCOUNTER — Emergency Department (HOSPITAL_COMMUNITY)
Admission: EM | Admit: 2016-06-19 | Discharge: 2016-06-20 | Disposition: A | Discharge status: Home / Self Care | Payer: BLUE CROSS/BLUE SHIELD | Attending: Emergency Medicine | Admitting: Emergency Medicine

## 2016-06-19 ENCOUNTER — Emergency Department (HOSPITAL_COMMUNITY): Payer: BLUE CROSS/BLUE SHIELD

## 2016-06-19 ENCOUNTER — Encounter (HOSPITAL_COMMUNITY): Payer: Self-pay

## 2016-06-19 DIAGNOSIS — G43809 Other migraine, not intractable, without status migrainosus: Secondary | ICD-10-CM | POA: Insufficient documentation

## 2016-06-19 DIAGNOSIS — F17210 Nicotine dependence, cigarettes, uncomplicated: Secondary | ICD-10-CM | POA: Diagnosis not present

## 2016-06-19 DIAGNOSIS — Z79899 Other long term (current) drug therapy: Secondary | ICD-10-CM | POA: Insufficient documentation

## 2016-06-19 DIAGNOSIS — R51 Headache: Secondary | ICD-10-CM | POA: Diagnosis not present

## 2016-06-19 MED ORDER — SODIUM CHLORIDE 0.9 % IV BOLUS (SEPSIS)
1000.0000 mL | Freq: Once | INTRAVENOUS | Status: AC
  Administered 2016-06-19: 1000 mL via INTRAVENOUS

## 2016-06-19 MED ORDER — KETOROLAC TROMETHAMINE 30 MG/ML IJ SOLN
30.0000 mg | Freq: Once | INTRAMUSCULAR | Status: AC
  Administered 2016-06-19: 30 mg via INTRAVENOUS
  Filled 2016-06-19: qty 1

## 2016-06-19 MED ORDER — PROCHLORPERAZINE EDISYLATE 5 MG/ML IJ SOLN
10.0000 mg | Freq: Once | INTRAMUSCULAR | Status: AC
  Administered 2016-06-19: 10 mg via INTRAVENOUS
  Filled 2016-06-19: qty 2

## 2016-06-19 NOTE — ED Triage Notes (Signed)
Started having headaches 3-4 months ago and they were bad.  A month later they started getting better.  Tonight I was at work and was going to my second job and started getting a headache, I went home and started vomiting and have been unable to eat.  I was dizzy and felt like I was going to pass out.  I have been to see a MD in [place] and they prescribed me ibuprofen 800 mg and that does not help.  They did blood work and was supposed to send me for a CT scan, but they have not ordered it.  Supposed to see a neurologist in [place] after CT scan.  Light and sound bother me.

## 2016-06-19 NOTE — ED Notes (Signed)
Pt reports that she works 75-80 hours weekly at 2 jobs- [employer] and [employer]- she is non photophobic, conversant and reports lack of sleep/

## 2016-06-19 NOTE — ED Provider Notes (Signed)
[facility]-EMERGENCY DEPT Provider Note   CSN: [id number] Arrival date & time: 06/19/16  2106 By signing my name below, I, Lauren Sherman, attest that this documentation has been prepared under the direction and in the presence of Margarita Grizzleanielle Jozef Eisenbeis, MD. Electronically Signed: Bridgette HabermannMaria Sherman, ED Scribe. 06/19/16. 10:47 PM.   History   Chief Complaint Chief Complaint  Patient presents with  . Migraine    HPI Comments: Lauren Sherman is a 20 y.o. female who presents to the Emergency Department complaining of sudden onset, constant, throbbing, left-sided headache onset today. Pt also has associated nausea and photophobia. Pt was at work and was going to her second job when she started getting a headache. Pt reports that she went home and vomited, she has been unable to eat since. Pt is able to ambulate without difficulty. Pt notes she has started having headaches four months ago and gets them 3-4 times a day. Pt has seen [place]CP in [place] who prescribed her Ibuprofen 800 mg with no relief. They also did blood work and was suppose to receive a neurologist referral but her PCP has not gotten back to her. Her last visit was one month ago. Pt is on Nexplanon placed in March 2017. Denies numbness, tingling, fever, visual disturbance, neck pain, or any other associated symptoms.   The history is provided by the patient. No language interpreter was used.    Past Medical History:  Diagnosis Date  . Encounter for education about contraceptive use 12/17/2015  . Nexplanon insertion 01/22/2016   Inserted left arm 01/22/16    Patient Active Problem List   Diagnosis Date Noted  . Nexplanon insertion 01/22/2016  . Encounter for education about contraceptive use 12/17/2015  . Sprain of ankle, unspecified site 02/13/2008    History reviewed. No pertinent surgical history.  OB History    Gravida Para Term Preterm AB Living   0 0 0 0 0 0   SAB TAB Ectopic Multiple Live Births   0 0 0 0         Home Medications     Prior to Admission medications   Medication Sig Start Date End Date Taking? Authorizing Provider  acetaminophen (TYLENOL) 500 MG tablet Take 1,000-1,500 mg by mouth every 6 (six) hours as needed for moderate pain.    Historical Provider, MD  aspirin-acetaminophen-caffeine (EXCEDRIN MIGRAINE) [id number] MG tablet Take 2 tablets by mouth every 6 (six) hours as needed for headache.    Historical Provider, MD  bacitracin ointment Apply 1 application topically 2 (two) times daily. Patient not taking: Reported on 05/15/2016 05/03/16   Eber HongBrian Miller, MD  diphenhydrAMINE (BENADRYL) 25 mg capsule Take 25 mg by mouth every 6 (six) hours as needed.    Historical Provider, MD  etonogestrel (NEXPLANON) 68 MG IMPL implant 1 each by Subdermal route once.    Historical Provider, MD  ibuprofen (ADVIL,MOTRIN) 200 MG tablet Take 400-600 mg by mouth every 6 (six) hours as needed for moderate pain.    Historical Provider, MD  megestrol (MEGACE) 40 MG tablet Take 3/day (at the same time) for 5 days; 2/day for 5 days, then 1/day PO prn bleeding 02/27/16   Jacklyn ShellFrances Cresenzo-Dishmon, CNM  naproxen sodium (ANAPROX) 220 MG tablet Take 440 mg by mouth daily as needed (pain).    Historical Provider, MD  terconazole (TERAZOL 7) 0.4 % vaginal cream Place 1 applicator vaginally at bedtime. Patient not taking: Reported on 03/02/2016 01/16/16   Lazaro ArmsLuther H Eure, MD    Family History  Family History  Problem Relation Age of Onset  . Cancer Paternal Grandmother     breast  . Cancer Other     Social History Social History  Substance Use Topics  . Smoking status: Current Every Day Smoker    Packs/day: 0.50    Types: Cigarettes  . Smokeless tobacco: Never Used  . Alcohol use Yes     Comment: occ     Allergies   Cherry and Peanut-containing drug products   Review of Systems Review of Systems  Constitutional: Negative for fever.  Eyes: Positive for photophobia. Negative for visual disturbance.  Musculoskeletal: Negative  for neck pain.  Neurological: Positive for headaches. Negative for numbness.  All other systems reviewed and are negative.    Physical Exam Updated Vital Signs BP 116/82 (BP Location: Left Arm)   Pulse 76   Temp 98.7 F (37.1 C) (Oral)   Resp 16   Ht 5\' 3"  (1.6 m)   Wt 153 lb (69.4 kg)   LMP 06/10/2016 (Approximate)   SpO2 100%   BMI 27.10 kg/m   Physical Exam  Constitutional: She is oriented to person, place, and time. She appears well-developed and well-nourished.  HENT:  Head: Normocephalic and atraumatic.  Right Ear: External ear normal.  Left Ear: External ear normal.  Nose: Nose normal.  Mouth/Throat: Oropharynx is clear and moist.  Eyes: Conjunctivae and EOM are normal. Pupils are equal, round, and reactive to light.  Neck: Normal range of motion. Neck supple. No JVD present. No tracheal deviation present. No thyromegaly present.  Cardiovascular: Normal rate, regular rhythm, normal heart sounds and intact distal pulses.   Pulmonary/Chest: Effort normal and breath sounds normal. She has no wheezes.  Abdominal: Soft. Bowel sounds are normal. She exhibits no mass. There is no tenderness. There is no guarding.  Musculoskeletal: Normal range of motion.  Lymphadenopathy:    She has no cervical adenopathy.  Neurological: She is alert and oriented to person, place, and time. She has normal reflexes. She displays normal reflexes. No sensory deficit. Coordination and gait normal. GCS eye subscore is 4. GCS verbal subscore is 5. GCS motor subscore is 6. She displays no Babinski's sign on the right side. She displays no Babinski's sign on the left side.  Reflex Scores:      Bicep reflexes are 2+ on the right side and 2+ on the left side.      Patellar reflexes are 2+ on the right side and 2+ on the left side. Strength is normal and equal throughout. Cranial nerves grossly intact. Patient fluent. No gross ataxia and patient able to ambulate without difficulty.  Skin: Skin is  warm and dry.  Psychiatric: She has a normal mood and affect. Her behavior is normal. Judgment and thought content normal.  Nursing note and vitals reviewed.    ED Treatments / Results  DIAGNOSTIC STUDIES: Oxygen Saturation is 100% on RA, normal by my interpretation.    COORDINATION OF CARE: 10:43 PM Discussed treatment plan with pt at bedside which includes head CT and pt agreed to plan.  Labs (all labs ordered are listed, but only abnormal results are displayed) Labs Reviewed - No data to display  EKG  EKG Interpretation None       Radiology Ct Head Wo Contrast  Result Date: 06/19/2016 CLINICAL DATA:  Sudden onset of left side headache starting today EXAM: CT HEAD WITHOUT CONTRAST TECHNIQUE: Contiguous axial images were obtained from the base of the skull through the vertex without intravenous  contrast. COMPARISON:  None. FINDINGS: Brain: No intracranial hemorrhage, mass effect or midline shift. No acute cortical infarction. No mass lesion is noted on this unenhanced scan. No hydrocephalus. Vascular: No hyperdense vessel or unexpected calcification. Skull: No skull fracture is noted. Sinuses/Orbits: Paranasal sinuses and mastoid air cells are unremarkable. Other: None IMPRESSION: No acute intracranial abnormality. Electronically Signed   By: Natasha Mead M.D.   On: 06/19/2016 23:38    Procedures Procedures (including critical care time)  Medications Ordered in ED Medications - No data to display   Initial Impression / Assessment and Plan / ED Course  I have reviewed the triage vital signs and the nursing notes.  Pertinent labs & imaging results that were available during my care of the patient were reviewed by me and considered in my medical decision making (see chart for details).  Clinical Course  Comment By Time  reviewed Margarita Grizzle, MD 08/18 2350      Final Clinical Impressions(s) / ED Diagnoses   Final diagnoses:  Other migraine without status migrainosus,  not intractable    New Prescriptions New Prescriptions   No medications on file  I personally performed the services described in this documentation, which was scribed in my presence. The recorded information has been reviewed and considered.    Margarita Grizzle, MD 06/19/16 2351

## 2016-06-23 ENCOUNTER — Other Ambulatory Visit: Payer: BLUE CROSS/BLUE SHIELD | Admitting: Adult Health

## 2016-07-07 ENCOUNTER — Ambulatory Visit (INDEPENDENT_AMBULATORY_CARE_PROVIDER_SITE_OTHER): Payer: BLUE CROSS/BLUE SHIELD | Admitting: Adult Health

## 2016-07-07 ENCOUNTER — Other Ambulatory Visit (HOSPITAL_COMMUNITY)
Admission: RE | Admit: 2016-07-07 | Discharge: 2016-07-07 | Disposition: A | Discharge status: Home / Self Care | Payer: BLUE CROSS/BLUE SHIELD | Source: Ambulatory Visit | Attending: Adult Health | Admitting: Adult Health

## 2016-07-07 ENCOUNTER — Encounter: Payer: Self-pay | Admitting: Adult Health

## 2016-07-07 VITALS — BP 118/80 | HR 86 | Ht 63.0 in | Wt 153.0 lb

## 2016-07-07 DIAGNOSIS — Z113 Encounter for screening for infections with a predominantly sexual mode of transmission: Secondary | ICD-10-CM | POA: Diagnosis not present

## 2016-07-07 DIAGNOSIS — Z01419 Encounter for gynecological examination (general) (routine) without abnormal findings: Secondary | ICD-10-CM | POA: Insufficient documentation

## 2016-07-07 DIAGNOSIS — Z975 Presence of (intrauterine) contraceptive device: Secondary | ICD-10-CM

## 2016-07-07 MED ORDER — MEGESTROL ACETATE 40 MG PO TABS: ORAL_TABLET | ORAL | 3 refills | Status: DC

## 2016-07-07 NOTE — Patient Instructions (Addendum)
Physical in 1 year, pap in 3 if normal Take megace as needed

## 2016-07-07 NOTE — Progress Notes (Signed)
Patient ID: Lauren Sherman, female   DOB: 07/01/1996, 20 y.o.   MRN: [id number] History of Present Illness: Lauren Sherman is a 20 year old biracial female in for a well woman gyn exam and pap.She has nexplanon and will have irregular bleeding at times.   Current Medications, Allergies, Past Medical History, Past Surgical History, Family History and Social History were reviewed in [facility] electronic medical record.     Review of Systems: Patient denies any headaches, hearing loss, fatigue, blurred vision, shortness of breath, chest pain, abdominal pain, problems with bowel movements, urination, or intercourse. No joint pain or mood swings. See HPI for positives.   Physical Exam:BP 118/80 (BP Location: Left Arm, Patient Position: Sitting, Cuff Size: Normal)   Pulse 86   Ht 5\' 3"  (1.6 m)   Wt 153 lb (69.4 kg)   LMP 06/10/2016 (Approximate)   BMI 27.10 kg/m  General:  Well developed, well nourished, no acute distress Skin:  Warm and dry, has lots of tattoos  Neck:  Midline trachea, normal thyroid, good ROM, no lymphadenopathy Lungs; Clear to auscultation bilaterally Breast:  No dominant palpable mass, retraction, or nipple discharge,has bilateral nipple rods Cardiovascular: Regular rate and rhythm Abdomen:  Soft, non tender, no hepatosplenomegaly,has navel ring Pelvic:  External genitalia is normal in appearance, no lesions.  The vagina is normal in appearance. Urethra has no lesions or masses. The cervix is smooth, pap with GC/CHL performed. Uterus is felt to be normal size, shape, and contour.  No adnexal masses or tenderness noted.Bladder is non tender, no masses felt. Extremities/musculoskeletal:  No swelling or varicosities noted, no clubbing or cyanosis Psych:  No mood changes, alert and cooperative,seems happy   Impression: Well woman gyn exam and pap Nexplanon in place     Plan: Physical in 1 year, pap in 3 if normal Meds ordered this encounter  Medications  . megestrol  (MEGACE) 40 MG tablet    Sig: Take 3/day (at the same time) for 5 days; 2/day for 5 days, then 1/day PO prn bleeding    Dispense:  60 tablet    Refill:  3    Order Specific Question:   Supervising Provider    Answer:   Lauren Sherman [[id number]]

## 2016-07-09 LAB — CYTOLOGY - PAP

## 2016-07-13 ENCOUNTER — Telehealth: Payer: Self-pay | Admitting: Adult Health

## 2016-07-13 ENCOUNTER — Encounter: Payer: Self-pay | Admitting: Adult Health

## 2016-07-13 DIAGNOSIS — A749 Chlamydial infection, unspecified: Secondary | ICD-10-CM

## 2016-07-13 DIAGNOSIS — N760 Acute vaginitis: Secondary | ICD-10-CM

## 2016-07-13 DIAGNOSIS — B9689 Other specified bacterial agents as the cause of diseases classified elsewhere: Secondary | ICD-10-CM

## 2016-07-13 HISTORY — DX: Other specified bacterial agents as the cause of diseases classified elsewhere: B96.89

## 2016-07-13 HISTORY — DX: Chlamydial infection, unspecified: A74.9

## 2016-07-13 HISTORY — DX: Acute vaginitis: N76.0

## 2016-07-13 MED ORDER — METRONIDAZOLE 500 MG PO TABS: 500.0000 mg | ORAL_TABLET | Freq: Two times a day (BID) | ORAL | 0 refills | Status: DC

## 2016-07-13 MED ORDER — AZITHROMYCIN 500 MG PO TABS: ORAL_TABLET | ORAL | 0 refills | Status: DC

## 2016-07-13 NOTE — Telephone Encounter (Signed)
Left message to call about pap 

## 2016-07-13 NOTE — Telephone Encounter (Signed)
Pt aware +BV on pap and +chlamydia on pap will treat with azithromycin 500 mg #2 2 po now and no sex with POT 10/9 at 10 am and will rx flagyl 500 mg #14 take 1 bid, [facility] sent and will treat partner Lanette HampshireHenry Hayes DOB [birth year]05[birth year] with azithromycin 500 mg #2 2 po now at [facility]

## 2016-08-10 ENCOUNTER — Ambulatory Visit: Payer: BLUE CROSS/BLUE SHIELD | Admitting: Adult Health

## 2016-08-11 ENCOUNTER — Encounter: Payer: Self-pay | Admitting: Adult Health

## 2016-08-11 ENCOUNTER — Ambulatory Visit: Payer: BLUE CROSS/BLUE SHIELD | Admitting: Adult Health

## 2016-08-11 ENCOUNTER — Ambulatory Visit (INDEPENDENT_AMBULATORY_CARE_PROVIDER_SITE_OTHER): Payer: BLUE CROSS/BLUE SHIELD | Admitting: Adult Health

## 2016-08-11 VITALS — BP 108/78 | HR 64 | Ht 63.0 in | Wt 148.0 lb

## 2016-08-11 DIAGNOSIS — Z8619 Personal history of other infectious and parasitic diseases: Secondary | ICD-10-CM

## 2016-08-11 DIAGNOSIS — Z975 Presence of (intrauterine) contraceptive device: Secondary | ICD-10-CM

## 2016-08-11 DIAGNOSIS — A749 Chlamydial infection, unspecified: Secondary | ICD-10-CM

## 2016-08-11 NOTE — Patient Instructions (Signed)
Use condoms Follow up prn 

## 2016-08-11 NOTE — Progress Notes (Signed)
Subjective:     Patient ID: Lauren Sherman, female   DOB: 08/24/1996, 20 y.o.   MRN: [id number]  HPI Lauren Sherman is a 20 year old, biracial female in for proof of treatment for recent + Chlamydia. She and her partner took meds together, and has had sex once.  Review of Systems Patient denies any headaches, hearing loss, fatigue, blurred vision, shortness of breath, chest pain, abdominal pain, problems with bowel movements, urination, or intercourse. No joint pain or mood swings. Reviewed past medical,surgical, social and family history. Reviewed medications and allergies.     Objective:   Physical Exam BP 108/78   Pulse 64   Ht 5\' 3"  (1.6 m)   Wt 148 lb (67.1 kg)   BMI 26.22 kg/m    Skin warm and dry.Pelvic: external genitalia is normal in appearance no lesions, vagina: white discharge with odor,urethra has no lesions or masses noted, cervix:smooth, uterus: normal size, shape and contour, non tender, no masses felt, adnexa: no masses or tenderness noted. Bladder is non tender and no masses felt.Nexplanon easily palpated left arm. GC/CHL obtained.  PHQ 2 score 0.    Assessment:     1. History of chlamydia   2. Nexplanon in place       Plan:     GC/CHL sent Use condoms Follow up prn

## 2016-08-13 LAB — GC/CHLAMYDIA PROBE AMP
Chlamydia trachomatis, NAA: NEGATIVE
Neisseria gonorrhoeae by PCR: NEGATIVE

## 2016-08-17 ENCOUNTER — Telehealth: Payer: Self-pay | Admitting: Adult Health

## 2016-08-17 NOTE — Telephone Encounter (Signed)
Pt aware GC/CHL both negative  

## 2016-08-24 ENCOUNTER — Telehealth: Payer: Self-pay | Admitting: *Deleted

## 2016-08-24 NOTE — Telephone Encounter (Signed)
Received voicemail from patient.  I called pt back.  Pt just wanted us to know that she is doing much better since she has been taking her medicine. 08-24-16  AS

## 2016-09-03 DIAGNOSIS — S93401A Sprain of unspecified ligament of right ankle, initial encounter: Secondary | ICD-10-CM | POA: Diagnosis not present

## 2016-09-03 DIAGNOSIS — S336XXA Sprain of sacroiliac joint, initial encounter: Secondary | ICD-10-CM | POA: Diagnosis not present

## 2016-09-04 DIAGNOSIS — S93401A Sprain of unspecified ligament of right ankle, initial encounter: Secondary | ICD-10-CM | POA: Diagnosis not present

## 2016-09-04 DIAGNOSIS — S336XXA Sprain of sacroiliac joint, initial encounter: Secondary | ICD-10-CM | POA: Diagnosis not present

## 2016-09-07 DIAGNOSIS — S93401A Sprain of unspecified ligament of right ankle, initial encounter: Secondary | ICD-10-CM | POA: Diagnosis not present

## 2016-09-07 DIAGNOSIS — S336XXA Sprain of sacroiliac joint, initial encounter: Secondary | ICD-10-CM | POA: Diagnosis not present

## 2016-09-10 DIAGNOSIS — S93401A Sprain of unspecified ligament of right ankle, initial encounter: Secondary | ICD-10-CM | POA: Diagnosis not present

## 2016-09-10 DIAGNOSIS — S336XXA Sprain of sacroiliac joint, initial encounter: Secondary | ICD-10-CM | POA: Diagnosis not present

## 2016-10-19 ENCOUNTER — Ambulatory Visit: Payer: BLUE CROSS/BLUE SHIELD | Admitting: Adult Health

## 2016-10-20 ENCOUNTER — Emergency Department (HOSPITAL_COMMUNITY)
Admission: EM | Admit: 2016-10-20 | Discharge: 2016-10-20 | Disposition: A | Discharge status: Home / Self Care | Payer: BLUE CROSS/BLUE SHIELD | Attending: Emergency Medicine | Admitting: Emergency Medicine

## 2016-10-20 ENCOUNTER — Encounter (HOSPITAL_COMMUNITY): Payer: Self-pay | Admitting: *Deleted

## 2016-10-20 DIAGNOSIS — Z87891 Personal history of nicotine dependence: Secondary | ICD-10-CM | POA: Diagnosis not present

## 2016-10-20 DIAGNOSIS — N898 Other specified noninflammatory disorders of vagina: Secondary | ICD-10-CM | POA: Diagnosis present

## 2016-10-20 DIAGNOSIS — N760 Acute vaginitis: Secondary | ICD-10-CM | POA: Insufficient documentation

## 2016-10-20 DIAGNOSIS — B9689 Other specified bacterial agents as the cause of diseases classified elsewhere: Secondary | ICD-10-CM | POA: Diagnosis not present

## 2016-10-20 LAB — URINALYSIS, ROUTINE W REFLEX MICROSCOPIC
BILIRUBIN URINE: NEGATIVE
GLUCOSE, UA: NEGATIVE mg/dL
Ketones, ur: NEGATIVE mg/dL
Leukocytes, UA: NEGATIVE
Nitrite: NEGATIVE
PROTEIN: NEGATIVE mg/dL
pH: 5.5 (ref 5.0–8.0)

## 2016-10-20 LAB — URINALYSIS, MICROSCOPIC (REFLEX)

## 2016-10-20 LAB — PREGNANCY, URINE: PREG TEST UR: NEGATIVE

## 2016-10-20 LAB — WET PREP, GENITAL
SPERM: NONE SEEN
Trich, Wet Prep: NONE SEEN
YEAST WET PREP: NONE SEEN

## 2016-10-20 MED ORDER — LIDOCAINE HCL (PF) 1 % IJ SOLN
INTRAMUSCULAR | Status: AC
  Administered 2016-10-20: 0.9 mL
  Filled 2016-10-20: qty 5

## 2016-10-20 MED ORDER — METRONIDAZOLE 500 MG PO TABS: 500.0000 mg | ORAL_TABLET | Freq: Two times a day (BID) | ORAL | 0 refills | Status: DC

## 2016-10-20 MED ORDER — CEFTRIAXONE SODIUM 250 MG IJ SOLR
250.0000 mg | Freq: Once | INTRAMUSCULAR | Status: AC
  Administered 2016-10-20: 250 mg via INTRAMUSCULAR
  Filled 2016-10-20: qty 250

## 2016-10-20 MED ORDER — AZITHROMYCIN 250 MG PO TABS
1000.0000 mg | ORAL_TABLET | Freq: Once | ORAL | Status: AC
  Administered 2016-10-20: 1000 mg via ORAL
  Filled 2016-10-20: qty 4

## 2016-10-20 NOTE — ED Provider Notes (Signed)
[facility] DEPT Provider Note   CSN: [id number] Arrival date & time: 10/20/16  0609     History   Chief Complaint Chief Complaint  Patient presents with  . Vaginal Itching    HPI Lauren Sherman is a 20 y.o. female with a h/o recent chlamydia infection treated with a TOC in 10/17 ( as well as boyfriend) and trichomoniasis presenting for vulvar pruritus.   HPI Patient noticed an odor while having intercourse with her boyfriend on Tuesday. On Thursday she noted intermittent vulvar itching and irritation.  Notes more vaginal discharge than usual with possible smell, however it has not changed in consistency. She denies dysuria, urinary urgency, urinary frequency, fevers, chills, abdominal pain, flank pain, N/V. She was going to see her gynecologist but had to reschedule and didn't want to wait.   Past Medical History:  Diagnosis Date  . BV (bacterial vaginosis) 07/13/2016  . Chlamydia infection 07/13/2016  . Encounter for education about contraceptive use 12/17/2015  . Migraines   . Nexplanon insertion 01/22/2016   Inserted left arm 01/22/16    Patient Active Problem List   Diagnosis Date Noted  . Chlamydia infection 07/13/2016  . BV (bacterial vaginosis) 07/13/2016  . Encounter for gynecological examination with Papanicolaou smear of cervix 07/07/2016  . Nexplanon in place 07/07/2016  . Nexplanon insertion 01/22/2016  . Encounter for education about contraceptive use 12/17/2015  . Sprain of ankle, unspecified site 02/13/2008    History reviewed. No pertinent surgical history.  OB History    Gravida Para Term Preterm AB Living   0 0 0 0 0 0   SAB TAB Ectopic Multiple Live Births   0 0 0 0         Home Medications    Prior to Admission medications   Medication Sig Start Date End Date Taking? Authorizing Provider  aspirin-acetaminophen-caffeine (EXCEDRIN MIGRAINE) [phone] MG tablet Take 2 tablets by mouth every 6 (six) hours as needed for headache.    Historical  Provider, MD  azithromycin (ZITHROMAX) 500 MG tablet Take 2 po now Patient not taking: Reported on 08/11/2016 07/13/16   Adline Potter, NP  diphenhydrAMINE (BENADRYL) 25 mg capsule Take 25 mg by mouth every 6 (six) hours as needed.    Historical Provider, MD  etonogestrel (NEXPLANON) 68 MG IMPL implant 1 each by Subdermal route once.    Historical Provider, MD  megestrol (MEGACE) 40 MG tablet Take 3/day (at the same time) for 5 days; 2/day for 5 days, then 1/day PO prn bleeding 07/07/16   Adline Potter, NP  metroNIDAZOLE (FLAGYL) 500 MG tablet Take 1 tablet (500 mg total) by mouth 2 (two) times daily. 10/20/16   Joanna Puff, MD    Family History Family History  Problem Relation Age of Onset  . Cancer Paternal Grandmother     breast  . Cancer Other     Social History Social History  Substance Use Topics  . Smoking status: Former Smoker    Packs/day: 0.00    Types: Cigarettes  . Smokeless tobacco: Never Used  . Alcohol use Yes     Comment: occ     Allergies   Cherry and Peanut-containing drug products   Review of Systems Review of Systems  Constitutional: Negative for activity change, appetite change, chills and fever.  Gastrointestinal: Negative for abdominal pain, diarrhea, nausea and vomiting.  Genitourinary: Positive for vaginal discharge. Negative for decreased urine volume, difficulty urinating, dyspareunia, dysuria, flank pain, frequency, genital sores, hematuria, pelvic  pain, urgency, vaginal bleeding and vaginal pain.  Musculoskeletal: Negative for arthralgias and back pain.  Skin: Negative for rash.  All other systems reviewed and are negative.    Physical Exam Updated Vital Signs BP 103/70 (BP Location: Left Arm)   Pulse 64   Temp 98.2 F (36.8 C) (Oral)   Resp 15   Ht 5\' 3"  (1.6 m)   Wt 67.1 kg   SpO2 99%   BMI 26.22 kg/m   Physical Exam  Constitutional: She is oriented to person, place, and time. She appears well-developed and  well-nourished. No distress.  HENT:  Head: Normocephalic and atraumatic.  Eyes: Right eye exhibits no discharge. Left eye exhibits no discharge. No scleral icterus.  Cardiovascular: Normal rate, regular rhythm, normal heart sounds and intact distal pulses.   No murmur heard. Pulmonary/Chest: Effort normal and breath sounds normal. No respiratory distress. She has no wheezes. She has no rales.  Abdominal: Soft. Bowel sounds are normal. She exhibits no distension. There is no tenderness. There is no rebound and no guarding.  Genitourinary:  Genitourinary Comments: External genitalia within normal limits.  Vaginal mucosa pink, moist, normal rugae.  Nonfriable cervix without lesions or bleeding noted on speculum exam. Small amount of thin yellow discharge.  Bimanual exam revealed normal, nongravid uterus.  No cervical motion tenderness. No adnexal masses bilaterally.     Musculoskeletal: She exhibits no edema.  Neurological: She is alert and oriented to person, place, and time.  Skin: Skin is warm. Capillary refill takes less than 2 seconds. No rash noted. She is not diaphoretic.  Psychiatric: She has a normal mood and affect. Her behavior is normal.     ED Treatments / Results  Labs (all labs ordered are listed, but only abnormal results are displayed) Labs Reviewed  WET PREP, GENITAL - Abnormal; Notable for the following:       Result Value   Clue Cells Wet Prep HPF POC PRESENT (*)    WBC, Wet Prep HPF POC RARE (*)    All other components within normal limits  URINALYSIS, ROUTINE W REFLEX MICROSCOPIC - Abnormal; Notable for the following:    Specific Gravity, Urine >1.030 (*)    Hgb urine dipstick TRACE (*)    All other components within normal limits  URINALYSIS, MICROSCOPIC (REFLEX) - Abnormal; Notable for the following:    Bacteria, UA MANY (*)    Squamous Epithelial / LPF TOO NUMEROUS TO COUNT (*)    All other components within normal limits  PREGNANCY, URINE  GC/CHLAMYDIA PROBE  AMP ([place]) NOT AT [facility]    EKG  EKG Interpretation None       Radiology No results found.  Procedures Procedures (including critical care time)  Medications Ordered in ED Medications  azithromycin (ZITHROMAX) tablet 1,000 mg (1,000 mg Oral Given 10/20/16 0812)  cefTRIAXone (ROCEPHIN) injection 250 mg (250 mg Intramuscular Given 10/20/16 0812)  lidocaine (PF) (XYLOCAINE) 1 % injection (0.9 mLs  Given 10/20/16 [id number])     Initial Impression / Assessment and Plan / ED Course  I have reviewed the triage vital signs and the nursing notes.  Pertinent labs & imaging results that were available during my care of the patient were reviewed by me and considered in my medical decision making (see chart for details).  Clinical Course     This is a 20 year old female with past medical history of chlamydia presenting with concerns for vulvar irritation, itching, and discharge. Wet prep is consistent with bacterial vaginosis. We'll  prescribe metronidazole 500 mg twice a day for 7 days total. Discussed empiric treatment for gonorrhea and chlamydia versus waiting for lab work. Patient prefers to be treated empirically. Will give azithromycin and ceftriaxone here in the ED. The patient has no systemic symptoms, no evidence of PID or TOA on exam. Discussed importance of using condoms consistently to prevent sexually diseases. Patient is stable for outpatient management. Discussed return precautions with her.  Final Clinical Impressions(s) / ED Diagnoses   Final diagnoses:  Bacterial vaginosis    New Prescriptions New Prescriptions   METRONIDAZOLE (FLAGYL) 500 MG TABLET    Take 1 tablet (500 mg total) by mouth 2 (two) times daily.     Joanna Puffrystal S Dorsey, MD 10/20/16 [id number]    Blane OharaJoshua Zavitz, MD 10/20/16 [phone]

## 2016-10-20 NOTE — ED Triage Notes (Signed)
Pt c/o vaginal itching and irritation after having intercourse with her boyfriend x 4 day ago

## 2016-10-20 NOTE — Discharge Instructions (Signed)
Your initial results are consistent with bacterial vaginosis which is NOT a sexually transmitted disease, it is the overgrowth of normal vaginal bacteria that all women have.  I have prescribed Metronidazole (Flagyl) for you to take twice daily for 7 days total. Do NOT consume alcohol while taking this medication.   The tests for gonorrhea and chlamydia are still pending. We have treated you for these here in the ED just in case. If your test is positive, you'll be notified. If you are concerned about STDs, use condoms consistently to prevent transmission.

## 2016-10-21 LAB — GC/CHLAMYDIA PROBE AMP (~~LOC~~) NOT AT ARMC
Chlamydia: NEGATIVE
NEISSERIA GONORRHEA: NEGATIVE

## 2016-11-08 ENCOUNTER — Emergency Department (HOSPITAL_COMMUNITY)
Admission: EM | Admit: 2016-11-08 | Discharge: 2016-11-08 | Disposition: A | Discharge status: Home / Self Care | Payer: BLUE CROSS/BLUE SHIELD | Attending: Emergency Medicine | Admitting: Emergency Medicine

## 2016-11-08 ENCOUNTER — Encounter (HOSPITAL_COMMUNITY): Payer: Self-pay

## 2016-11-08 DIAGNOSIS — Z7982 Long term (current) use of aspirin: Secondary | ICD-10-CM | POA: Insufficient documentation

## 2016-11-08 DIAGNOSIS — N760 Acute vaginitis: Secondary | ICD-10-CM | POA: Insufficient documentation

## 2016-11-08 DIAGNOSIS — Z87891 Personal history of nicotine dependence: Secondary | ICD-10-CM | POA: Diagnosis not present

## 2016-11-08 DIAGNOSIS — B9689 Other specified bacterial agents as the cause of diseases classified elsewhere: Secondary | ICD-10-CM | POA: Diagnosis not present

## 2016-11-08 DIAGNOSIS — L298 Other pruritus: Secondary | ICD-10-CM | POA: Diagnosis present

## 2016-11-08 LAB — URINALYSIS, ROUTINE W REFLEX MICROSCOPIC
BILIRUBIN URINE: NEGATIVE
Bacteria, UA: NONE SEEN
GLUCOSE, UA: NEGATIVE mg/dL
HGB URINE DIPSTICK: NEGATIVE
Ketones, ur: NEGATIVE mg/dL
NITRITE: NEGATIVE
PH: 7 (ref 5.0–8.0)
Protein, ur: NEGATIVE mg/dL
SPECIFIC GRAVITY, URINE: 1.019 (ref 1.005–1.030)

## 2016-11-08 LAB — WET PREP, GENITAL
SPERM: NONE SEEN
Trich, Wet Prep: NONE SEEN
YEAST WET PREP: NONE SEEN

## 2016-11-08 LAB — POC URINE PREG, ED: Preg Test, Ur: NEGATIVE

## 2016-11-08 MED ORDER — CEPHALEXIN 500 MG PO CAPS: 500.0000 mg | ORAL_CAPSULE | Freq: Four times a day (QID) | ORAL | 0 refills | Status: DC

## 2016-11-08 MED ORDER — METRONIDAZOLE 500 MG PO TABS: 500.0000 mg | ORAL_TABLET | Freq: Two times a day (BID) | ORAL | 0 refills | Status: DC

## 2016-11-08 NOTE — ED Provider Notes (Signed)
[facility] Provider Note   CSN: [id number] Arrival date & time: 11/08/16  1959     History   Chief Complaint Chief Complaint  Patient presents with  . Vaginal Itching    HPI Lauren Sherman is a 21 y.o. female.  HPI   Lauren Sherman is a 21 y.o. female who presents to the Emergency Department complaining of recurrent vaginal irritation, itching and burning after intercourse.  She was seen here last month for same.  She states that she had sex earlier today and developed irritation shortly afterwards.  She was treated last month for BV.  She denies vaginal d/c, pelvic pain or abd pain, fever, dysuria, genital rash, and abnormal vaginal bleeding.  Admits to unprotected sex, but monogamous relationship.     Past Medical History:  Diagnosis Date  . BV (bacterial vaginosis) 07/13/2016  . Chlamydia infection 07/13/2016  . Encounter for education about contraceptive use 12/17/2015  . Migraines   . Nexplanon insertion 01/22/2016   Inserted left arm 01/22/16    Patient Active Problem List   Diagnosis Date Noted  . Chlamydia infection 07/13/2016  . BV (bacterial vaginosis) 07/13/2016  . Encounter for gynecological examination with Papanicolaou smear of cervix 07/07/2016  . Nexplanon in place 07/07/2016  . Nexplanon insertion 01/22/2016  . Encounter for education about contraceptive use 12/17/2015  . Sprain of ankle, unspecified site 02/13/2008    History reviewed. No pertinent surgical history.  OB History    Gravida Para Term Preterm AB Living   0 0 0 0 0 0   SAB TAB Ectopic Multiple Live Births   0 0 0 0         Home Medications    Prior to Admission medications   Medication Sig Start Date End Date Taking? Authorizing Provider  aspirin-acetaminophen-caffeine (EXCEDRIN MIGRAINE) [phone] MG tablet Take 2 tablets by mouth every 6 (six) hours as needed for headache.   Yes Historical Provider, MD  diphenhydrAMINE (BENADRYL) 25 mg capsule Take 25 mg by mouth every  6 (six) hours as needed.   Yes Historical Provider, MD  etonogestrel (NEXPLANON) 68 MG IMPL implant 1 each by Subdermal route once.   Yes Historical Provider, MD  megestrol (MEGACE) 40 MG tablet Take 3/day (at the same time) for 5 days; 2/day for 5 days, then 1/day PO prn bleeding Patient not taking: Reported on 11/08/2016 07/07/16   Adline PotterJennifer A Griffin, NP  metroNIDAZOLE (FLAGYL) 500 MG tablet Take 1 tablet (500 mg total) by mouth 2 (two) times daily. Patient not taking: Reported on 11/08/2016 10/20/16   Joanna Puffrystal S Dorsey, MD    Family History Family History  Problem Relation Age of Onset  . Cancer Paternal Grandmother     breast  . Cancer Other     Social History Social History  Substance Use Topics  . Smoking status: Former Smoker    Packs/day: 0.00    Types: Cigarettes  . Smokeless tobacco: Never Used  . Alcohol use Yes     Comment: occ     Allergies   Cherry and Peanut-containing drug products   Review of Systems Review of Systems  Constitutional: Negative for activity change, appetite change and fever.  Respiratory: Negative for cough and chest tightness.   Cardiovascular: Negative for chest pain.  Gastrointestinal: Negative for abdominal pain, nausea and vomiting.  Genitourinary: Negative for dysuria, genital sores, pelvic pain, vaginal bleeding, vaginal discharge and vaginal pain.       Vaginal itching and irritation  Musculoskeletal: Negative for back pain.  Skin: Negative for rash.  All other systems reviewed and are negative.    Physical Exam Updated Vital Signs BP 112/69 (BP Location: Left Arm)   Pulse 70   Temp 98.3 F (36.8 C) (Oral)   Resp 24   Ht 5\' 3"  (1.6 m)   Wt 67.1 kg   LMP 09/08/2016   SpO2 100%   BMI 26.22 kg/m   Physical Exam  Constitutional: She is oriented to person, place, and time. She appears well-nourished.  HENT:  Head: Atraumatic.  Cardiovascular: Normal rate and regular rhythm.   Pulmonary/Chest: Effort normal and breath sounds  normal. No respiratory distress.  Abdominal: Soft. She exhibits no distension. There is no tenderness. There is no guarding.  Genitourinary: There is no rash or lesion on the right labia. There is no rash or lesion on the left labia. Uterus is deviated. Cervix exhibits no motion tenderness and no discharge. Right adnexum displays no mass and no tenderness. Left adnexum displays no mass and no tenderness. There is tenderness in the vagina. No erythema or bleeding in the vagina. No foreign body in the vagina. No vaginal discharge found.  Genitourinary Comments: Exam chaperoned by nursing.  nml appearing external genitalia.  slightly rotated cervix.  No bleeding, lesions, or excessive vag d/c.  Adnexa is NT, no palpable masses.  Neurological: She is alert and oriented to person, place, and time.  Skin: Skin is warm.  Psychiatric: She has a normal mood and affect.  Nursing note and vitals reviewed.    ED Treatments / Results  Labs (all labs ordered are listed, but only abnormal results are displayed) Labs Reviewed  WET PREP, GENITAL - Abnormal; Notable for the following:       Result Value   WBC, Wet Prep HPF POC FEW (*)    All other components within normal limits  URINALYSIS, ROUTINE W REFLEX MICROSCOPIC - Abnormal; Notable for the following:    APPearance HAZY (*)    Leukocytes, UA LARGE (*)    All other components within normal limits  POC URINE PREG, ED  GC/CHLAMYDIA PROBE AMP () NOT AT [facility]    EKG  EKG Interpretation None       Radiology No results found.  Procedures Procedures (including critical care time)  Medications Ordered in ED Medications - No data to display   Initial Impression / Assessment and Plan / ED Course  I have reviewed the triage vital signs and the nursing notes.  Pertinent labs & imaging results that were available during my care of the patient were reviewed by me and considered in my medical decision making (see chart for  details).  Clinical Course     Pt with recurrent vaginitis associated with intercourse.  nml appearing pelvic exam.  Pt offered prophylactic STD tx but states she was treated last month and prefers to wait for culture results this time.  abd is soft and NT.  Counseled pt on safe sex practices and need for GYN f/u since this is a recurrent problem.  She appears stable for d/c and agrees to plan. Will rx with flagyl for BV.  No concerning sx's for PID, TOA.  u preg neg.    Final Clinical Impressions(s) / ED Diagnoses   Final diagnoses:  Bacterial vaginosis    New Prescriptions New Prescriptions   No medications on file     Pauline Aus, Cordelia Poche 11/10/16 1349    Bethann Berkshire, MD 11/13/16 [phone]

## 2016-11-08 NOTE — ED Triage Notes (Signed)
Having vaginal irritation, noticed it while I was having sex.  It was burning during sex and itching afterwards per pt.

## 2016-11-08 NOTE — Discharge Instructions (Signed)
You will be contacted if your cultures are positive.  Call [facility] to arrange a follow-up appt.  Use safe sex practices.

## 2016-11-11 LAB — GC/CHLAMYDIA PROBE AMP (~~LOC~~) NOT AT ARMC
CHLAMYDIA, DNA PROBE: NEGATIVE
NEISSERIA GONORRHEA: NEGATIVE

## 2016-11-11 LAB — URINE CULTURE: Culture: 20000 — AB

## 2016-11-12 ENCOUNTER — Telehealth (HOSPITAL_BASED_OUTPATIENT_CLINIC_OR_DEPARTMENT_OTHER): Payer: Self-pay

## 2016-11-12 NOTE — Telephone Encounter (Signed)
Post ED Visit - Positive Culture Follow-up  Culture report reviewed by antimicrobial stewardship pharmacist:  []  Enzo BiNathan Batchelder, Pharm.D. []  Celedonio MiyamotoJeremy Frens, Pharm.D., BCPS []  Garvin FilaMike Maccia, Pharm.D. []  Georgina PillionElizabeth Martin, Pharm.D., BCPS []  [place]h Pham, [place]D., BCPS, AAHIVP []  Estella HuskMichelle Turner, Pharm.D., BCPS, AAHIVP []  Tennis Mustassie Stewart, Pharm.D. []  Sherle Poeob Vincent, [place].Ophelia Shoulder. X  Margaret Shuda, Pharm.D.  Positive urine culture, 20,000 colonies -> Staph. species Treated with cephalexin No change  Lauren Sherman, Lauren Sherman 11/12/2016, 10:41 AM

## 2016-12-21 ENCOUNTER — Emergency Department (HOSPITAL_COMMUNITY)
Admission: EM | Admit: 2016-12-21 | Discharge: 2016-12-22 | Disposition: A | Discharge status: Home / Self Care | Payer: BLUE CROSS/BLUE SHIELD | Attending: Emergency Medicine | Admitting: Emergency Medicine

## 2016-12-21 ENCOUNTER — Encounter (HOSPITAL_COMMUNITY): Payer: Self-pay

## 2016-12-21 DIAGNOSIS — Z87891 Personal history of nicotine dependence: Secondary | ICD-10-CM | POA: Diagnosis not present

## 2016-12-21 DIAGNOSIS — Z7982 Long term (current) use of aspirin: Secondary | ICD-10-CM | POA: Insufficient documentation

## 2016-12-21 DIAGNOSIS — Z202 Contact with and (suspected) exposure to infections with a predominantly sexual mode of transmission: Secondary | ICD-10-CM | POA: Diagnosis present

## 2016-12-21 DIAGNOSIS — R1084 Generalized abdominal pain: Secondary | ICD-10-CM | POA: Insufficient documentation

## 2016-12-21 DIAGNOSIS — Z79899 Other long term (current) drug therapy: Secondary | ICD-10-CM | POA: Diagnosis not present

## 2016-12-21 NOTE — ED Triage Notes (Signed)
Pain in my stomach.  Started out that my breasts were sore and tender.  Today I was having sharp pain in my stomach.  Patient requesting a pelvic exam and STD check.

## 2016-12-22 LAB — URINALYSIS, ROUTINE W REFLEX MICROSCOPIC
BACTERIA UA: NONE SEEN
BILIRUBIN URINE: NEGATIVE
GLUCOSE, UA: NEGATIVE mg/dL
HGB URINE DIPSTICK: NEGATIVE
KETONES UR: NEGATIVE mg/dL
NITRITE: NEGATIVE
PH: 6 (ref 5.0–8.0)
Protein, ur: NEGATIVE mg/dL
SPECIFIC GRAVITY, URINE: 1.019 (ref 1.005–1.030)

## 2016-12-22 LAB — POC URINE PREG, ED: Preg Test, Ur: NEGATIVE

## 2016-12-22 NOTE — ED Provider Notes (Signed)
[facility] Provider Note   CSN: [id number] Arrival date & time: 12/21/16  2317  By signing my name below, I, Elder NegusRussell Johnston, attest that this documentation has been prepared under the direction and in the presence of Zadie Rhineonald Adryel Wortmann, MD. Electronically Signed: Elder Negusussell Johnston, Scribe. 12/22/16. 12:13 AM.   History   Chief Complaint Chief Complaint  Patient presents with  . Exposure to STD    HPI Lauren Sherman is a 21 y.o. female without chronic medical problems who presents to the ED for evaluation of pelvic pain. This patient was in her usual health until 1 week ago when she developed intermittent LLQ/L pelvic pain which is severe and unassociated with any particular symptoms. At interview, she denies any active pelvic pain. She denies any vaginal bleeding/discharge. Denies any recent painful intercourse. She denies nausea, vomiting, or diarrhea. She is sexually active, single partner, doesn't use protection. Denies any surgical history. She denies any dysuria, frequency.   The history is provided by the patient. No language interpreter was used.    Past Medical History:  Diagnosis Date  . BV (bacterial vaginosis) 07/13/2016  . Chlamydia infection 07/13/2016  . Encounter for education about contraceptive use 12/17/2015  . Migraines   . Nexplanon insertion 01/22/2016   Inserted left arm 01/22/16    Patient Active Problem List   Diagnosis Date Noted  . Chlamydia infection 07/13/2016  . BV (bacterial vaginosis) 07/13/2016  . Encounter for gynecological examination with Papanicolaou smear of cervix 07/07/2016  . Nexplanon in place 07/07/2016  . Nexplanon insertion 01/22/2016  . Encounter for education about contraceptive use 12/17/2015  . Sprain of ankle, unspecified site 02/13/2008    History reviewed. No pertinent surgical history.  OB History    Gravida Para Term Preterm AB Living   0 0 0 0 0 0   SAB TAB Ectopic Multiple Live Births   0 0 0 0         Home  Medications    Prior to Admission medications   Medication Sig Start Date End Date Taking? Authorizing Provider  aspirin-acetaminophen-caffeine (EXCEDRIN MIGRAINE) [phone] MG tablet Take 2 tablets by mouth every 6 (six) hours as needed for headache.   Yes Historical Provider, MD  diphenhydrAMINE (BENADRYL) 25 mg capsule Take 25 mg by mouth every 6 (six) hours as needed.   Yes Historical Provider, MD  etonogestrel (NEXPLANON) 68 MG IMPL implant 1 each by Subdermal route once.   Yes Historical Provider, MD  cephALEXin (KEFLEX) 500 MG capsule Take 1 capsule (500 mg total) by mouth 4 (four) times daily. 11/08/16   Tammy Triplett, PA-C  metroNIDAZOLE (FLAGYL) 500 MG tablet Take 1 tablet (500 mg total) by mouth 2 (two) times daily. 11/08/16   Tammy Triplett, PA-C    Family History Family History  Problem Relation Age of Onset  . Cancer Paternal Grandmother     breast  . Cancer Other     Social History Social History  Substance Use Topics  . Smoking status: Former Smoker    Packs/day: 0.00    Types: Cigarettes  . Smokeless tobacco: Never Used  . Alcohol use Yes     Comment: occ     Allergies   Cherry and Peanut-containing drug products   Review of Systems Review of Systems  Gastrointestinal: Positive for abdominal pain. Negative for diarrhea, nausea and vomiting.  Genitourinary: Positive for pelvic pain. Negative for dysuria, vaginal bleeding and vaginal discharge.  All other systems reviewed and are negative.  Physical Exam Updated Vital Signs BP 110/74 (BP Location: Right Arm)   Pulse 63   Temp 98.7 F (37.1 C) (Oral)   Resp 16   Ht 5\' 3"  (1.6 m)   Wt 148 lb (67.1 kg)   SpO2 100%   BMI 26.22 kg/m   Physical Exam CONSTITUTIONAL: Well developed/well nourished HEAD: Normocephalic/atraumatic ENMT: Mucous membranes moist NECK: supple no meningeal signs SPINE/BACK:entire spine nontender CV: S1/S2 noted, no murmurs/rubs/gallops noted LUNGS: Lungs are clear to  auscultation bilaterally, no apparent distress ABDOMEN: soft, nontender, no rebound or guarding, bowel sounds noted throughout abdomen GU:no cva tenderness NEURO: Pt is awake/alert/appropriate, moves all extremitiesx4.  No facial droop.   EXTREMITIES: pulses normal/equal, full ROM SKIN: warm, color normal PSYCH: no abnormalities of mood noted, alert and oriented to situation  ED Treatments / Results  DIAGNOSTIC STUDIES: Oxygen Saturation is 100 percent on room air which is normal by my interpretation.    COORDINATION OF CARE: 12:13 AM Discussed treatment plan with pt at bedside and pt agreed to plan.  Labs (all labs ordered are listed, but only abnormal results are displayed) Labs Reviewed  URINALYSIS, ROUTINE W REFLEX MICROSCOPIC - Abnormal; Notable for the following:       Result Value   APPearance HAZY (*)    Leukocytes, UA TRACE (*)    All other components within normal limits  POC URINE PREG, ED  GC/CHLAMYDIA PROBE AMP ([place]) NOT AT [facility]    EKG  EKG Interpretation None       Radiology No results found.  Procedures Procedures (including critical care time)  Medications Ordered in ED Medications - No data to display   Initial Impression / Assessment and Plan / ED Course  I have reviewed the triage vital signs and the nursing notes.  Pertinent labs  results that were available during my care of the patient were reviewed by me and considered in my medical decision making (see chart for details).     Pt has had multiple ED visits with pelvic exams in the past 2 months and negative STD workup Defer pelvic for now as she has no abd pain or vaginal complaints Advised f/u with GYN Will add on urine GC testing   Final Clinical Impressions(s) / ED Diagnoses   Final diagnoses:  Generalized abdominal pain    New Prescriptions New Prescriptions   No medications on file  I personally performed the services described in this documentation, which was  scribed in my presence. The recorded information has been reviewed and is accurate.       Zadie Rhine, MD 12/22/16 [phone]

## 2017-01-01 ENCOUNTER — Other Ambulatory Visit: Payer: BLUE CROSS/BLUE SHIELD | Admitting: Adult Health

## 2017-01-15 ENCOUNTER — Emergency Department (HOSPITAL_COMMUNITY): Payer: BLUE CROSS/BLUE SHIELD

## 2017-01-15 ENCOUNTER — Emergency Department (HOSPITAL_COMMUNITY)
Admission: EM | Admit: 2017-01-15 | Discharge: 2017-01-15 | Disposition: A | Discharge status: Home / Self Care | Payer: BLUE CROSS/BLUE SHIELD | Attending: Emergency Medicine | Admitting: Emergency Medicine

## 2017-01-15 ENCOUNTER — Encounter (HOSPITAL_COMMUNITY): Payer: Self-pay

## 2017-01-15 DIAGNOSIS — Z87891 Personal history of nicotine dependence: Secondary | ICD-10-CM | POA: Diagnosis not present

## 2017-01-15 DIAGNOSIS — Y929 Unspecified place or not applicable: Secondary | ICD-10-CM | POA: Diagnosis not present

## 2017-01-15 DIAGNOSIS — Y9289 Other specified places as the place of occurrence of the external cause: Secondary | ICD-10-CM | POA: Diagnosis not present

## 2017-01-15 DIAGNOSIS — X58XXXA Exposure to other specified factors, initial encounter: Secondary | ICD-10-CM | POA: Diagnosis not present

## 2017-01-15 DIAGNOSIS — S86912A Strain of unspecified muscle(s) and tendon(s) at lower leg level, left leg, initial encounter: Secondary | ICD-10-CM | POA: Diagnosis not present

## 2017-01-15 DIAGNOSIS — X501XXA Overexertion from prolonged static or awkward postures, initial encounter: Secondary | ICD-10-CM | POA: Insufficient documentation

## 2017-01-15 DIAGNOSIS — Y990 Civilian activity done for income or pay: Secondary | ICD-10-CM | POA: Diagnosis not present

## 2017-01-15 DIAGNOSIS — Y9389 Activity, other specified: Secondary | ICD-10-CM | POA: Insufficient documentation

## 2017-01-15 DIAGNOSIS — S8992XA Unspecified injury of left lower leg, initial encounter: Secondary | ICD-10-CM | POA: Diagnosis not present

## 2017-01-15 DIAGNOSIS — Z79899 Other long term (current) drug therapy: Secondary | ICD-10-CM | POA: Insufficient documentation

## 2017-01-15 DIAGNOSIS — S86812A Strain of other muscle(s) and tendon(s) at lower leg level, left leg, initial encounter: Secondary | ICD-10-CM | POA: Diagnosis not present

## 2017-01-15 DIAGNOSIS — M25562 Pain in left knee: Secondary | ICD-10-CM | POA: Diagnosis not present

## 2017-01-15 MED ORDER — DICLOFENAC SODIUM 50 MG PO TBEC: 50.0000 mg | DELAYED_RELEASE_TABLET | Freq: Two times a day (BID) | ORAL | 0 refills | Status: DC

## 2017-01-15 NOTE — ED Notes (Signed)
ED Provider at bedside. 

## 2017-01-15 NOTE — Discharge Instructions (Signed)
Continue to wear your knee brace and follow up with Dr. Romeo AppleHarrison, return here as needed.

## 2017-01-15 NOTE — ED Triage Notes (Signed)
Left knee pain onset last week at work. Pt does a twisting motion and felt it pop. Knee has continue to hurt and feels like it needs to pop.

## 2017-01-15 NOTE — ED Notes (Signed)
Pt stated that she does not need crutches, she has her own at home to use

## 2017-01-15 NOTE — ED Provider Notes (Signed)
[facility] Provider Note   CSN: [id number] Arrival date & time: 01/15/17  1928     History   Chief Complaint Chief Complaint  Patient presents with  . Knee Pain    HPI Lauren Sherman is a 21 y.o. female who presents to the ED with knee pain. Patient reports that she was at work and turned to get something and when she turned her knee didn't and she felt pain in the left knee. She bought a knee brace that she has been wearing. She denies pain when she is non weight bearing on the knee but states that when she walks the pain returns.   The history is provided by the patient. No language interpreter was used.  Knee Pain   This is a new problem. The current episode started more than 1 week ago. The problem has not changed since onset.The pain is present in the left knee. The quality of the pain is described as sharp. The pain is at a severity of 8/10. She has tried OTC pain medications for the symptoms.    Past Medical History:  Diagnosis Date  . BV (bacterial vaginosis) 07/13/2016  . Chlamydia infection 07/13/2016  . Encounter for education about contraceptive use 12/17/2015  . Migraines   . Nexplanon insertion 01/22/2016   Inserted left arm 01/22/16    Patient Active Problem List   Diagnosis Date Noted  . Chlamydia infection 07/13/2016  . BV (bacterial vaginosis) 07/13/2016  . Encounter for gynecological examination with Papanicolaou smear of cervix 07/07/2016  . Nexplanon in place 07/07/2016  . Nexplanon insertion 01/22/2016  . Encounter for education about contraceptive use 12/17/2015  . Sprain of ankle, unspecified site 02/13/2008    History reviewed. No pertinent surgical history.  OB History    Gravida Para Term Preterm AB Living   0 0 0 0 0 0   SAB TAB Ectopic Multiple Live Births   0 0 0 0         Home Medications    Prior to Admission medications   Medication Sig Start Date End Date Taking? Authorizing Provider  diclofenac (VOLTAREN) 50 MG EC  tablet Take 1 tablet (50 mg total) by mouth 2 (two) times daily. 01/15/17   Hope Orlene Och, NP  diphenhydrAMINE (BENADRYL) 25 mg capsule Take 25 mg by mouth every 6 (six) hours as needed.    Historical Provider, MD  etonogestrel (NEXPLANON) 68 MG IMPL implant 1 each by Subdermal route once.    Historical Provider, MD    Family History Family History  Problem Relation Age of Onset  . Cancer Paternal Grandmother     breast  . Cancer Other     Social History Social History  Substance Use Topics  . Smoking status: Former Smoker    Packs/day: 0.00    Types: Cigarettes  . Smokeless tobacco: Never Used  . Alcohol use Yes     Comment: occ     Allergies   Cherry and Peanut-containing drug products   Review of Systems Review of Systems Negative except as stated in HPI  Physical Exam Updated Vital Signs BP 108/66 (BP Location: Right Arm)   Pulse 80   Temp 97.8 F (36.6 C) (Oral)   Resp 18   Ht 5\' 3"  (1.6 m)   Wt 67.1 kg   SpO2 100%   BMI 26.22 kg/m   Physical Exam  Constitutional: She appears well-developed and well-nourished. No distress.  HENT:  Head: Normocephalic.  Eyes: EOM  are normal.  Neck: Neck supple.  Cardiovascular: Normal rate.   Pulmonary/Chest: Effort normal.  Abdominal: There is no tenderness.  Musculoskeletal:       Left knee: She exhibits no effusion, no deformity, no laceration, no erythema, normal alignment and normal patellar mobility. Decreased range of motion: due to pain. Swelling: minimal. Tenderness found.  Pedal pulses 2+, adequate circulation.   Neurological: She is alert.  Skin: Skin is warm and dry.  Psychiatric: She has a normal mood and affect.  Nursing note and vitals reviewed.    ED Treatments / Results  Labs (all labs ordered are listed, but only abnormal results are displayed) Labs Reviewed - No data to display  Radiology Dg Knee Complete 4 Views Left  Result Date: 01/15/2017 CLINICAL DATA:  Left knee pain. Twisting motion  and felt a pop. Initial encounter. EXAM: LEFT KNEE - COMPLETE 4+ VIEW COMPARISON:  None. FINDINGS: No evidence of fracture, dislocation, or joint effusion. No evidence of arthropathy or other focal bone abnormality. Soft tissues are unremarkable. IMPRESSION: Negative. Electronically Signed   By: Sebastian AcheAllen  Grady M.D.   On: 01/15/2017 20:48    Procedures Procedures (including critical care time)  Medications Ordered in ED Medications - No data to display   Initial Impression / Assessment and Plan / ED Course  I have reviewed the triage vital signs and the nursing notes.  Pertinent imaging results that were available during my care of the patient were reviewed by me and considered in my medical decision making (see chart for details).   Final Clinical Impressions(s) / ED Diagnoses  21 y.o. female with left knee pain after a twisting injury stable for d/c without fracture or dislocation noted on x-ray. Knee brace, pain management, ice, elevation and f/u with ortho if symptoms persist.  Final diagnoses:  Strain of left knee, initial encounter    New Prescriptions Discharge Medication List as of 01/15/2017  9:10 PM    START taking these medications   Details  diclofenac (VOLTAREN) 50 MG EC tablet Take 1 tablet (50 mg total) by mouth 2 (two) times daily., Starting Fri 01/15/2017, [address]Print         Hope Fort TowsonM Neese, NP 01/17/17 [id number]    Loren Raceravid Yelverton, MD 01/20/17 [phone]

## 2017-01-15 NOTE — ED Notes (Signed)
Called radiology to notify them to bring pt to 21 when she is complete

## 2017-01-19 DIAGNOSIS — S8392XA Sprain of unspecified site of left knee, initial encounter: Secondary | ICD-10-CM | POA: Diagnosis not present

## 2017-01-19 DIAGNOSIS — S335XXA Sprain of ligaments of lumbar spine, initial encounter: Secondary | ICD-10-CM | POA: Diagnosis not present

## 2017-01-20 DIAGNOSIS — S8392XA Sprain of unspecified site of left knee, initial encounter: Secondary | ICD-10-CM | POA: Diagnosis not present

## 2017-01-20 DIAGNOSIS — S335XXA Sprain of ligaments of lumbar spine, initial encounter: Secondary | ICD-10-CM | POA: Diagnosis not present

## 2017-01-22 DIAGNOSIS — S8392XA Sprain of unspecified site of left knee, initial encounter: Secondary | ICD-10-CM | POA: Diagnosis not present

## 2017-01-22 DIAGNOSIS — S335XXA Sprain of ligaments of lumbar spine, initial encounter: Secondary | ICD-10-CM | POA: Diagnosis not present

## 2017-01-26 DIAGNOSIS — S8392XA Sprain of unspecified site of left knee, initial encounter: Secondary | ICD-10-CM | POA: Diagnosis not present

## 2017-01-26 DIAGNOSIS — S335XXA Sprain of ligaments of lumbar spine, initial encounter: Secondary | ICD-10-CM | POA: Diagnosis not present

## 2017-03-14 ENCOUNTER — Emergency Department (HOSPITAL_COMMUNITY)
Admission: EM | Admit: 2017-03-14 | Discharge: 2017-03-14 | Disposition: A | Discharge status: Home / Self Care | Payer: BLUE CROSS/BLUE SHIELD | Attending: Emergency Medicine | Admitting: Emergency Medicine

## 2017-03-14 ENCOUNTER — Encounter (HOSPITAL_COMMUNITY): Payer: Self-pay | Admitting: *Deleted

## 2017-03-14 DIAGNOSIS — M5441 Lumbago with sciatica, right side: Secondary | ICD-10-CM | POA: Insufficient documentation

## 2017-03-14 DIAGNOSIS — M5431 Sciatica, right side: Secondary | ICD-10-CM

## 2017-03-14 DIAGNOSIS — M545 Low back pain: Secondary | ICD-10-CM | POA: Diagnosis present

## 2017-03-14 DIAGNOSIS — Z87891 Personal history of nicotine dependence: Secondary | ICD-10-CM | POA: Diagnosis not present

## 2017-03-14 MED ORDER — METHOCARBAMOL 500 MG PO TABS: 500.0000 mg | ORAL_TABLET | Freq: Three times a day (TID) | ORAL | 0 refills | Status: DC

## 2017-03-14 NOTE — Discharge Instructions (Signed)
Continue taking 800 mg ibuprofen 3 times a day with food.  Follow-up with your doctor for recheck or return here if needed.

## 2017-03-14 NOTE — ED Provider Notes (Signed)
[facility] Provider Note   CSN: [id number] Arrival date & time: 03/14/17  1832   By signing my name below, I, Lauren Sherman, attest that this documentation has been prepared under the direction and in the presence of Sylis Ketchum, PA-C. Electronically Signed: Freida Busmaniana Sherman, Scribe. 03/14/2017. 7:02 PM.  History   Chief Complaint Chief Complaint  Patient presents with  . Back Pain     The history is provided by the patient. No language interpreter was used.     HPI Comments:  Lauren Sherman is a [age] y.o. female who presents to the Emergency Department complaining of intermittent, sharp, lower back pain x 3 days. She notes her pain shoots down into her buttocks. Pain is also worse with bending and twisting movements. She notes she does a lot of lifting and twisting at work. She has taken ibuprofen 800mg   with mild temporary relief. Pt denies radiation of pain down her BLE and into her abdomen, fever or chills.  She also denies numbness or weakness of the lower extremities, urinary symptoms and bowel/bladder incontinence or retention.   Past Medical History:  Diagnosis Date  . BV (bacterial vaginosis) 07/13/2016  . Chlamydia infection 07/13/2016  . Encounter for education about contraceptive use 12/17/2015  . Migraines   . Nexplanon insertion 01/22/2016   Inserted left arm 01/22/16    Patient Active Problem List   Diagnosis Date Noted  . Chlamydia infection 07/13/2016  . BV (bacterial vaginosis) 07/13/2016  . Encounter for gynecological examination with Papanicolaou smear of cervix 07/07/2016  . Nexplanon in place 07/07/2016  . Nexplanon insertion 01/22/2016  . Encounter for education about contraceptive use 12/17/2015  . Sprain of ankle, unspecified site 02/13/2008    History reviewed. No pertinent surgical history.  OB History    Gravida Para Term Preterm AB Living   0 0 0 0 0 0   SAB TAB Ectopic Multiple Live Births   0 0 0 0         Home Medications     Prior to Admission medications   Medication Sig Start Date End Date Taking? Authorizing Provider  diclofenac (VOLTAREN) 50 MG EC tablet Take 1 tablet (50 mg total) by mouth 2 (two) times daily. 01/15/17   Janne NapoleonNeese, Hope M, NP  diphenhydrAMINE (BENADRYL) 25 mg capsule Take 25 mg by mouth every 6 (six) hours as needed.    [provider]  etonogestrel (NEXPLANON) 68 MG IMPL implant 1 each by Subdermal route once.    [provider]    Family History Family History  Problem Relation Age of Onset  . Cancer Paternal Grandmother        breast  . Cancer Other     Social History Social History  Substance Use Topics  . Smoking status: Former Smoker    Packs/day: 0.00    Types: Cigarettes  . Smokeless tobacco: Never Used  . Alcohol use Yes     Comment: occ     Allergies   Cherry and Peanut-containing drug products   Review of Systems Review of Systems  Constitutional: Negative for fever.  Respiratory: Negative for shortness of breath.   Gastrointestinal: Negative for abdominal pain, constipation and vomiting.  Genitourinary: Negative for decreased urine volume, difficulty urinating, dysuria, flank pain, frequency, hematuria and urgency.  Musculoskeletal: Positive for back pain. Negative for joint swelling.  Skin: Negative for rash.  Neurological: Negative for weakness and numbness.  All other systems reviewed and are negative.    Physical  Exam Updated Vital Signs BP 112/63 (BP Location: Right Arm)   Pulse 78   Temp 98.2 F (36.8 C) (Oral)   Resp 18   Ht 5\' 3"  (1.6 m)   Wt 145 lb (65.8 kg)   SpO2 100%   BMI 25.69 kg/m   Physical Exam  Constitutional: She is oriented to person, place, and time. She appears well-developed and well-nourished. No distress.  HENT:  Head: Normocephalic and atraumatic.  Mouth/Throat: Oropharynx is clear and moist.  Cardiovascular: Normal rate, regular rhythm and intact distal pulses.   Pulmonary/Chest: Effort normal.   Abdominal: Soft. She exhibits no distension. There is no tenderness.  Musculoskeletal: She exhibits tenderness. She exhibits no edema or deformity.  Tenderness of the lower right lumbar paraspinal muscles and SI joint Negative SLR bilaterally .  5/5 motor strength bilaterally  Neurological: She is alert and oriented to person, place, and time. She has normal strength. No sensory deficit. Gait normal.  Skin: Skin is warm and dry. Capillary refill takes less than 2 seconds. No rash noted.  Psychiatric: She has a normal mood and affect.  Nursing note and vitals reviewed.    ED Treatments / Results  DIAGNOSTIC STUDIES:  Oxygen Saturation is 100% on RA, normal by my interpretation.    COORDINATION OF CARE:  7:02 PM Pt advised to take Ibuprofen TID and to apply ice/heat to the site. Discussed treatment plan with pt at bedside and pt agreed to plan.  Labs (all labs ordered are listed, but only abnormal results are displayed) Labs Reviewed - No data to display  EKG  EKG Interpretation None       Radiology No results found.  Procedures Procedures (including critical care time)  Medications Ordered in ED Medications - No data to display   Initial Impression / Assessment and Plan / ED Course  I have reviewed the triage vital signs and the nursing notes.  Pertinent labs & imaging results that were available during my care of the patient were reviewed by me and considered in my medical decision making (see chart for details).     Pt is well appearing, no focal neuro deficits, ambulates with a steady gait. NV intact.  No concerning sx's for emergent neurological process.  Agrees to ibuprofen and muscle relaxer.  Return precautions discussed.     Final Clinical Impressions(s) / ED Diagnoses   Final diagnoses:  Sciatica of right side    New Prescriptions New Prescriptions   No medications on file   I personally performed the services described in this documentation,  which was scribed in my presence. The recorded information has been reviewed and is accurate.     Pauline Aus, PA-C 03/14/17 2029    Samuel Jester, DO 03/17/17 [phone]

## 2017-03-14 NOTE — ED Triage Notes (Signed)
Pt c/o lower back pain that started Friday. Worse with movement. Denies injury. Denies urinary symptoms.

## 2017-04-10 ENCOUNTER — Encounter: Payer: Self-pay | Admitting: Adult Health

## 2017-04-15 ENCOUNTER — Ambulatory Visit (INDEPENDENT_AMBULATORY_CARE_PROVIDER_SITE_OTHER): Payer: BLUE CROSS/BLUE SHIELD | Admitting: Adult Health

## 2017-04-15 ENCOUNTER — Encounter: Payer: Self-pay | Admitting: Adult Health

## 2017-04-15 VITALS — BP 102/60 | HR 98 | Ht 63.0 in | Wt 148.0 lb

## 2017-04-15 DIAGNOSIS — N760 Acute vaginitis: Secondary | ICD-10-CM | POA: Diagnosis not present

## 2017-04-15 DIAGNOSIS — N898 Other specified noninflammatory disorders of vagina: Secondary | ICD-10-CM

## 2017-04-15 DIAGNOSIS — B9689 Other specified bacterial agents as the cause of diseases classified elsewhere: Secondary | ICD-10-CM | POA: Diagnosis not present

## 2017-04-15 LAB — POCT WET PREP (WET MOUNT)
CLUE CELLS WET PREP WHIFF POC: POSITIVE
WBC WET PREP: POSITIVE

## 2017-04-15 MED ORDER — METRONIDAZOLE 0.75 % VA GEL: 1.0000 | Freq: Every day | VAGINAL | 0 refills | Status: DC

## 2017-04-15 NOTE — Progress Notes (Signed)
Subjective:     Patient ID: Lauren Sherman, female   DOB: 03/31/1996, 21 y.o.   MRN: [id number]  HPI [name] is a 21 year old biracial female in complaining of vaginal odor and discharge and thinks it is BV.   Review of Systems Vaginal odor and discharge Reviewed past medical,surgical, social and family history. Reviewed medications and allergies.     Objective:   Physical Exam BP 102/60 (BP Location: Left Arm, Patient Position: Sitting, Cuff Size: Normal)   Pulse 98   Ht 5\' 3"  (1.6 m)   Wt 148 lb (67.1 kg)   BMI 26.22 kg/m  Skin warm and dry.Pelvic: external genitalia is normal in appearance no lesions, vagina: white discharge with odor,urethra has no lesions or masses noted, cervix:smooth, uterus: normal size, shape and contour, non tender, no masses felt, adnexa: no masses or tenderness noted. Bladder is non tender and no masses felt. Wet prep: + for clue cells and +WBCs. GC/CHL obtained.    Will try metrogel this time, has used flagyl in past.  Assessment:     1. Vaginal odor   2. Vaginal discharge   3. BV (bacterial vaginosis)       Plan:     Rx metrogel use 1 applicator in vagina at HS for 5 nights GC/CHL sent Follow up prn

## 2017-04-17 LAB — GC/CHLAMYDIA PROBE AMP
Chlamydia trachomatis, NAA: NEGATIVE
Neisseria gonorrhoeae by PCR: NEGATIVE

## 2017-04-23 ENCOUNTER — Emergency Department (HOSPITAL_COMMUNITY)
Admission: EM | Admit: 2017-04-23 | Discharge: 2017-04-23 | Disposition: A | Discharge status: Home / Self Care | Payer: BLUE CROSS/BLUE SHIELD | Attending: Emergency Medicine | Admitting: Emergency Medicine

## 2017-04-23 ENCOUNTER — Encounter (HOSPITAL_COMMUNITY): Payer: Self-pay | Admitting: Emergency Medicine

## 2017-04-23 DIAGNOSIS — Z79899 Other long term (current) drug therapy: Secondary | ICD-10-CM | POA: Insufficient documentation

## 2017-04-23 DIAGNOSIS — Z87891 Personal history of nicotine dependence: Secondary | ICD-10-CM | POA: Insufficient documentation

## 2017-04-23 DIAGNOSIS — R197 Diarrhea, unspecified: Secondary | ICD-10-CM

## 2017-04-23 LAB — CBC
HCT: 41.1 % (ref 36.0–46.0)
HEMOGLOBIN: 14.5 g/dL (ref 12.0–15.0)
MCH: 32.8 pg (ref 26.0–34.0)
MCHC: 35.3 g/dL (ref 30.0–36.0)
MCV: 93 fL (ref 78.0–100.0)
PLATELETS: 226 10*3/uL (ref 150–400)
RBC: 4.42 MIL/uL (ref 3.87–5.11)
RDW: 11.8 % (ref 11.5–15.5)
WBC: 5.6 10*3/uL (ref 4.0–10.5)

## 2017-04-23 LAB — PREGNANCY, URINE: PREG TEST UR: NEGATIVE

## 2017-04-23 LAB — COMPREHENSIVE METABOLIC PANEL
ALT: 12 U/L — ABNORMAL LOW (ref 14–54)
ANION GAP: 8 (ref 5–15)
AST: 16 U/L (ref 15–41)
Albumin: 4 g/dL (ref 3.5–5.0)
Alkaline Phosphatase: 42 U/L (ref 38–126)
BUN: 12 mg/dL (ref 6–20)
CHLORIDE: 107 mmol/L (ref 101–111)
CO2: 24 mmol/L (ref 22–32)
Calcium: 9.4 mg/dL (ref 8.9–10.3)
Creatinine, Ser: 0.94 mg/dL (ref 0.44–1.00)
GFR calc Af Amer: >60 mL/min (ref >60)
GFR calc non Af Amer: >60 mL/min (ref >60)
Glucose, Bld: 98 mg/dL (ref 65–99)
POTASSIUM: 3.4 mmol/L — AB (ref 3.5–5.1)
SODIUM: 139 mmol/L (ref 135–145)
Total Bilirubin: 0.4 mg/dL (ref 0.3–1.2)
Total Protein: 7.1 g/dL (ref 6.5–8.1)

## 2017-04-23 LAB — LIPASE, BLOOD: Lipase: 25 U/L (ref 11–51)

## 2017-04-23 MED ORDER — POTASSIUM CHLORIDE CRYS ER 20 MEQ PO TBCR
40.0000 meq | EXTENDED_RELEASE_TABLET | Freq: Once | ORAL | Status: AC
  Administered 2017-04-23: 40 meq via ORAL
  Filled 2017-04-23: qty 2

## 2017-04-23 NOTE — ED Triage Notes (Signed)
Pt reports N/V for the last week with D- worse last night  Dr Eliberto IvoryAustin is PCP

## 2017-04-23 NOTE — Discharge Instructions (Signed)
It was our pleasure to provide your ER care today - we hope that you feel better.  Drink plenty of fluids.  From today's lab tests, your potassium level is mildly low (3.4) - eat plenty of fruits and vegetables.   Follow up with primary care doctor in 1 week if symptoms fail to improve/resolve.  Return to ER if worse, new or severe abdominal pain, persistent vomiting, other concern.

## 2017-04-23 NOTE — ED Notes (Signed)
Patient given discharge instruction, verbalized understand. Patient ambulatory out of the department.  

## 2017-04-23 NOTE — ED Provider Notes (Signed)
[facility] Provider Note   CSN: [id number] Arrival date & time: 04/23/17  1421     History   Chief Complaint Chief Complaint  Patient presents with  . Diarrhea    HPI Lauren Sherman is a 21 y.o. female.  Patient c/o diarrhea for the past several days.  Is having 2-3 episodes per day, loose stools to watery at times. Denies abdominal pain. Had nausea earlier, none now. No vomiting. Denies dysuria or gu c/o. No vaginal discharge or bleeding. No abd distension. No known bad food ingestion or recent ill contacts. No recent antibiotic use. No faintness or dizziness.    The history is provided by the patient.  Diarrhea   Pertinent negatives include no abdominal pain, no headaches and no cough.    Past Medical History:  Diagnosis Date  . BV (bacterial vaginosis) 07/13/2016  . Chlamydia infection 07/13/2016  . Encounter for education about contraceptive use 12/17/2015  . Migraines   . Nexplanon insertion 01/22/2016   Inserted left arm 01/22/16    Patient Active Problem List   Diagnosis Date Noted  . Chlamydia infection 07/13/2016  . BV (bacterial vaginosis) 07/13/2016  . Encounter for gynecological examination with Papanicolaou smear of cervix 07/07/2016  . Nexplanon in place 07/07/2016  . Nexplanon insertion 01/22/2016  . Encounter for education about contraceptive use 12/17/2015  . Sprain of ankle, unspecified site 02/13/2008    History reviewed. No pertinent surgical history.  OB History    Gravida Para Term Preterm AB Living   0 0 0 0 0 0   SAB TAB Ectopic Multiple Live Births   0 0 0 0         Home Medications    Prior to Admission medications   Medication Sig Start Date End Date Taking? Authorizing Provider  diphenhydrAMINE (BENADRYL) 25 mg capsule Take 25 mg by mouth every 6 (six) hours as needed.    [provider]  etonogestrel (NEXPLANON) 68 MG IMPL implant 1 each by Subdermal route once.    [provider]  megestrol (MEGACE) 40  MG tablet Take 40 mg by mouth as needed.  04/02/17   [provider]  methocarbamol (ROBAXIN) 500 MG tablet Take 1 tablet (500 mg total) by mouth 3 (three) times daily. Patient taking differently: Take 500 mg by mouth as needed.  03/14/17   Triplett, Tammy, PA-C  metroNIDAZOLE (METROGEL VAGINAL) 0.75 % vaginal gel Place 1 Applicatorful vaginally at bedtime. 04/15/17   Adline Potter, NP    Family History Family History  Problem Relation Age of Onset  . Cancer Paternal Grandmother        breast  . Cancer Other     Social History Social History  Substance Use Topics  . Smoking status: Former Smoker    Packs/day: 0.00    Types: Cigarettes  . Smokeless tobacco: Never Used  . Alcohol use Yes     Comment: occ     Allergies   Cherry and Peanut-containing drug products   Review of Systems Review of Systems  Constitutional: Negative for fever.  HENT: Negative for sore throat.   Eyes: Negative for redness.  Respiratory: Negative for cough and shortness of breath.   Cardiovascular: Negative for chest pain.  Gastrointestinal: Positive for diarrhea. Negative for abdominal pain.  Genitourinary: Negative for dysuria.  Musculoskeletal: Negative for back pain.  Skin: Negative for rash.  Neurological: Negative for headaches.  Hematological: Does not bruise/bleed easily.  Psychiatric/Behavioral: Negative for confusion.  Physical Exam Updated Vital Signs BP (!) 129/95 (BP Location: Left Arm)   Pulse 87   Temp 98.1 F (36.7 C) (Oral)   Resp 18   Ht 1.6 m (5\' 3" )   Wt 67.1 kg (148 lb)   SpO2 100%   BMI 26.22 kg/m   Physical Exam  Constitutional: She appears well-developed and well-nourished. No distress.  HENT:  Head: Atraumatic.  Mouth/Throat: Oropharynx is clear and moist.  Eyes: Conjunctivae are normal. Pupils are equal, round, and reactive to light. No scleral icterus.  Neck: Neck supple. No tracheal deviation present. No thyromegaly present.    Cardiovascular: Normal rate, regular rhythm, normal heart sounds and intact distal pulses.  Exam reveals no gallop and no friction rub.   No murmur heard. Pulmonary/Chest: Effort normal and breath sounds normal. No respiratory distress.  Abdominal: Soft. Normal appearance and bowel sounds are normal. She exhibits no distension and no mass. There is no tenderness. There is no rebound and no guarding.  Genitourinary:  Genitourinary Comments: No cva tenderness  Musculoskeletal: She exhibits no edema.  Neurological: She is alert.  Skin: Skin is warm and dry. No rash noted. She is not diaphoretic.  Psychiatric: She has a normal mood and affect.  Nursing note and vitals reviewed.    ED Treatments / Results  Labs (all labs ordered are listed, but only abnormal results are displayed) Results for orders placed or performed during the hospital encounter of 04/23/17  CBC  Result Value Ref Range   WBC 5.6 4.0 - 10.5 K/uL   RBC 4.42 3.87 - 5.11 MIL/uL   Hemoglobin 14.5 12.0 - 15.0 g/dL   HCT 57.841.1 46.936.0 - 62.946.0 %   MCV 93.0 78.0 - 100.0 fL   MCH 32.8 26.0 - 34.0 pg   MCHC 35.3 30.0 - 36.0 g/dL   RDW 52.811.8 41.311.5 - 24.415.5 %   Platelets 226 150 - 400 K/uL  Comprehensive metabolic panel  Result Value Ref Range   Sodium 139 135 - 145 mmol/L   Potassium 3.4 (L) 3.5 - 5.1 mmol/L   Chloride 107 101 - 111 mmol/L   CO2 24 22 - 32 mmol/L   Glucose, Bld 98 65 - 99 mg/dL   BUN 12 6 - 20 mg/dL   Creatinine, Ser 0.100.94 0.44 - 1.00 mg/dL   Calcium 9.4 8.9 - 27.210.3 mg/dL   Total Protein 7.1 6.5 - 8.1 g/dL   Albumin 4.0 3.5 - 5.0 g/dL   AST 16 15 - 41 U/L   ALT 12 (L) 14 - 54 U/L   Alkaline Phosphatase 42 38 - 126 U/L   Total Bilirubin 0.4 0.3 - 1.2 mg/dL   GFR calc non Af Amer >60 >60 mL/min   GFR calc Af Amer >60 >60 mL/min   Anion gap 8 5 - 15  Lipase, blood  Result Value Ref Range   Lipase 25 11 - 51 U/L  Pregnancy, urine  Result Value Ref Range   Preg Test, Ur NEGATIVE NEGATIVE    EKG  EKG  Interpretation None       Radiology No results found.  Procedures Procedures (including critical care time)  Medications Ordered in ED Medications - No data to display   Initial Impression / Assessment and Plan / ED Course  I have reviewed the triage vital signs and the nursing notes.  Pertinent labs & imaging results that were available during my care of the patient were reviewed by me and considered in my medical decision  making (see chart for details).  Labs sent.  Trial of po fluids.  Reviewed nursing notes and prior charts for additional history.   kcl po.  Po fluids.  Pt tolerating po. abd is soft nt.     Final Clinical Impressions(s) / ED Diagnoses   Final diagnoses:  None    New Prescriptions New Prescriptions   No medications on file     Cathren Laine, MD 04/23/17 [phone]

## 2017-04-23 NOTE — ED Notes (Signed)
Pt refusing IV, will allow lab to stick

## 2017-04-26 ENCOUNTER — Encounter (HOSPITAL_COMMUNITY): Payer: Self-pay

## 2017-04-26 DIAGNOSIS — Z91010 Allergy to peanuts: Secondary | ICD-10-CM | POA: Diagnosis not present

## 2017-04-26 DIAGNOSIS — G43009 Migraine without aura, not intractable, without status migrainosus: Secondary | ICD-10-CM | POA: Diagnosis not present

## 2017-04-26 DIAGNOSIS — N760 Acute vaginitis: Secondary | ICD-10-CM | POA: Diagnosis not present

## 2017-04-26 DIAGNOSIS — B9689 Other specified bacterial agents as the cause of diseases classified elsewhere: Secondary | ICD-10-CM | POA: Diagnosis not present

## 2017-04-26 DIAGNOSIS — N898 Other specified noninflammatory disorders of vagina: Secondary | ICD-10-CM | POA: Insufficient documentation

## 2017-04-26 DIAGNOSIS — Z793 Long term (current) use of hormonal contraceptives: Secondary | ICD-10-CM | POA: Diagnosis not present

## 2017-04-26 DIAGNOSIS — Z5321 Procedure and treatment not carried out due to patient leaving prior to being seen by health care provider: Secondary | ICD-10-CM

## 2017-04-26 DIAGNOSIS — Z87891 Personal history of nicotine dependence: Secondary | ICD-10-CM | POA: Diagnosis not present

## 2017-04-26 NOTE — ED Triage Notes (Signed)
Pt reports vaginal itching and "raw feeling" onset 2 days ago. Denies abnormal discharge or bleeding.

## 2017-04-27 ENCOUNTER — Emergency Department (HOSPITAL_COMMUNITY)
Admission: EM | Admit: 2017-04-27 | Discharge: 2017-04-27 | Disposition: A | Discharge status: Home / Self Care | Payer: BLUE CROSS/BLUE SHIELD | Attending: Emergency Medicine | Admitting: Emergency Medicine

## 2017-04-27 ENCOUNTER — Encounter (HOSPITAL_COMMUNITY): Payer: Self-pay | Admitting: *Deleted

## 2017-04-27 ENCOUNTER — Emergency Department (HOSPITAL_COMMUNITY)
Admission: EM | Admit: 2017-04-27 | Discharge: 2017-04-27 | Disposition: A | Discharge status: Home / Self Care | Payer: BLUE CROSS/BLUE SHIELD | Source: Home / Self Care

## 2017-04-27 DIAGNOSIS — G43009 Migraine without aura, not intractable, without status migrainosus: Secondary | ICD-10-CM | POA: Insufficient documentation

## 2017-04-27 DIAGNOSIS — N760 Acute vaginitis: Secondary | ICD-10-CM | POA: Diagnosis not present

## 2017-04-27 DIAGNOSIS — Z91010 Allergy to peanuts: Secondary | ICD-10-CM | POA: Insufficient documentation

## 2017-04-27 DIAGNOSIS — B9689 Other specified bacterial agents as the cause of diseases classified elsewhere: Secondary | ICD-10-CM

## 2017-04-27 DIAGNOSIS — Z793 Long term (current) use of hormonal contraceptives: Secondary | ICD-10-CM | POA: Insufficient documentation

## 2017-04-27 DIAGNOSIS — Z87891 Personal history of nicotine dependence: Secondary | ICD-10-CM | POA: Insufficient documentation

## 2017-04-27 LAB — URINALYSIS, ROUTINE W REFLEX MICROSCOPIC
BILIRUBIN URINE: NEGATIVE
Glucose, UA: NEGATIVE mg/dL
Ketones, ur: NEGATIVE mg/dL
Nitrite: NEGATIVE
PH: 6 (ref 5.0–8.0)
Protein, ur: NEGATIVE mg/dL
SPECIFIC GRAVITY, URINE: 1.013 (ref 1.005–1.030)

## 2017-04-27 LAB — PREGNANCY, URINE: Preg Test, Ur: NEGATIVE

## 2017-04-27 LAB — WET PREP, GENITAL
SPERM: NONE SEEN
Trich, Wet Prep: NONE SEEN
Yeast Wet Prep HPF POC: NONE SEEN

## 2017-04-27 MED ORDER — DIPHENHYDRAMINE HCL 50 MG/ML IJ SOLN
25.0000 mg | Freq: Once | INTRAMUSCULAR | Status: AC
  Administered 2017-04-27: 25 mg via INTRAVENOUS
  Filled 2017-04-27: qty 1

## 2017-04-27 MED ORDER — SODIUM CHLORIDE 0.9 % IV BOLUS (SEPSIS)
1000.0000 mL | Freq: Once | INTRAVENOUS | Status: AC
  Administered 2017-04-27: 1000 mL via INTRAVENOUS

## 2017-04-27 MED ORDER — METRONIDAZOLE 500 MG PO TABS: 500.0000 mg | ORAL_TABLET | Freq: Two times a day (BID) | ORAL | 0 refills | Status: DC

## 2017-04-27 MED ORDER — METOCLOPRAMIDE HCL 5 MG/ML IJ SOLN
10.0000 mg | Freq: Once | INTRAMUSCULAR | Status: AC
  Administered 2017-04-27: 10 mg via INTRAVENOUS
  Filled 2017-04-27: qty 2

## 2017-04-27 NOTE — ED Provider Notes (Signed)
[facility] Provider Note   CSN: [id number] Arrival date & time: 04/27/17  0036     History   Chief Complaint Vaginal itching, migraine headache  HPI Lauren Sherman is a 21 y.o. female.  The history is provided by the patient.  She complains of vaginal itching for approximately the last 5 days. She was seen in her gynecologist's office 12 days ago and diagnosed with bacterial vaginosis and treated with MetroGel for 5 days. Approximately one-2 days following completion of the course of MetroGel, she started having vaginal itching. She denies any discharge. She has used some vaginal itching cream which have been prescribed for her previously, but without any benefit. She went to the emergency department at [facility], but left because of a long wait. While there, she developed a migraine headache. Headache is a throbbing pain in the left frontal area with associated nausea but no vomiting. She denies any visual change. Headache is typical of her migraines. She rates pain at 10/10. She has not taken anything for it. There is associated photophobia.  Past Medical History:  Diagnosis Date  . BV (bacterial vaginosis) 07/13/2016  . Chlamydia infection 07/13/2016  . Encounter for education about contraceptive use 12/17/2015  . Migraines   . Nexplanon insertion 01/22/2016   Inserted left arm 01/22/16    Patient Active Problem List   Diagnosis Date Noted  . Chlamydia infection 07/13/2016  . BV (bacterial vaginosis) 07/13/2016  . Encounter for gynecological examination with Papanicolaou smear of cervix 07/07/2016  . Nexplanon in place 07/07/2016  . Nexplanon insertion 01/22/2016  . Encounter for education about contraceptive use 12/17/2015  . Sprain of ankle, unspecified site 02/13/2008    History reviewed. No pertinent surgical history.  OB History    Gravida Para Term Preterm AB Living   0 0 0 0 0 0   SAB TAB Ectopic Multiple Live Births   0 0 0 0         Home  Medications    Prior to Admission medications   Medication Sig Start Date End Date Taking? Authorizing Provider  diphenhydrAMINE (BENADRYL) 25 mg capsule Take 25 mg by mouth every 6 (six) hours as needed.    [provider]  etonogestrel (NEXPLANON) 68 MG IMPL implant 1 each by Subdermal route once.    [provider]  megestrol (MEGACE) 40 MG tablet Take 40 mg by mouth as needed.  04/02/17   [provider]  methocarbamol (ROBAXIN) 500 MG tablet Take 1 tablet (500 mg total) by mouth 3 (three) times daily. Patient not taking: Reported on 04/23/2017 03/14/17   Triplett, Tammy, PA-C  metroNIDAZOLE (METROGEL VAGINAL) 0.75 % vaginal gel Place 1 Applicatorful vaginally at bedtime. Patient not taking: Reported on 04/23/2017 04/15/17   Adline Potter, NP    Family History Family History  Problem Relation Age of Onset  . Cancer Paternal Grandmother        breast  . Cancer Other     Social History Social History  Substance Use Topics  . Smoking status: Former Smoker    Packs/day: 0.00    Types: Cigarettes  . Smokeless tobacco: Never Used  . Alcohol use Yes     Comment: occ     Allergies   Cherry and Peanut-containing drug products   Review of Systems Review of Systems  All other systems reviewed and are negative.    Physical Exam Updated Vital Signs BP 118/81   Pulse 73   Temp  98.2 F (36.8 C)   Resp 20   Ht 5\' 3"  (1.6 m)   Wt 67.1 kg (148 lb)   SpO2 100%   BMI 26.22 kg/m   Physical Exam  Nursing note and vitals reviewed.  21 year old female, resting comfortably and in no acute distress. Vital signs are  normal. Oxygen saturation is 100%, which is normal. Head is normocephalic and atraumatic. PERRLA, EOMI. Oropharynx is clear. Neck is nontender and supple without adenopathy or JVD. Back is nontender and there is no CVA tenderness. Lungs are clear without rales, wheezes, or rhonchi. Chest is nontender. Heart has regular rate and rhythm  without murmur. Abdomen is soft, flat, nontender without masses or hepatosplenomegaly and peristalsis is normoactive. Pelvic: Normal external female genitalia. Moderate amount of white discharge present. Extremities have no cyanosis or edema, full range of motion is present. Skin is warm and dry without rash. Neurologic: Mental status is normal, cranial nerves are intact, there are no motor or sensory deficits.  ED Treatments / Results  Labs (all labs ordered are listed, but only abnormal results are displayed) Labs Reviewed  WET PREP, GENITAL - Abnormal; Notable for the following:       Result Value   Clue Cells Wet Prep HPF POC PRESENT (*)    WBC, Wet Prep HPF POC MANY (*)    All other components within normal limits  URINALYSIS, ROUTINE W REFLEX MICROSCOPIC - Abnormal; Notable for the following:    APPearance HAZY (*)    Hgb urine dipstick SMALL (*)    Leukocytes, UA LARGE (*)    Bacteria, UA RARE (*)    Squamous Epithelial / LPF 0-5 (*)    All other components within normal limits  PREGNANCY, URINE   Procedures Procedures (including critical care time)  Medications Ordered in ED Medications  sodium chloride 0.9 % bolus 1,000 mL (not administered)  metoCLOPramide (REGLAN) injection 10 mg (not administered)  diphenhydrAMINE (BENADRYL) injection 25 mg (not administered)     Initial Impression / Assessment and Plan / ED Course  I have reviewed the triage vital signs and the nursing notes.  Pertinent labs & imaging results that were available during my care of the patient were reviewed by me and considered in my medical decision making (see chart for details).  Vaginal itching of uncertain cause. Will do pelvic exam and check for presence of yeast. Headache typical of migraine headaches. On review of old records, she does have prior ED visits for migraine headaches. She had been seen in her current oncologist office on June 14 with diagnosis of bacterial vaginosis, and treated  with metronidazole vaginal gel.  Wet prep shows presence of clue cells and many bacteria. No evidence of yeast. She is discharged with prescription for metronidazole tablets, and she is instructed to follow-up with her gynecologist in one week.  Final Clinical Impressions(s) / ED Diagnoses   Final diagnoses:  Migraine without aura and without status migrainosus, not intractable  Bacterial vaginosis    New Prescriptions New Prescriptions   METRONIDAZOLE (FLAGYL) 500 MG TABLET    Take 1 tablet (500 mg total) by mouth 2 (two) times daily.     Dione BoozeGlick, Mayu Ronk, MD 04/27/17 [phone]

## 2017-04-27 NOTE — ED Notes (Signed)
Pelvic cart to bedside 

## 2017-04-27 NOTE — ED Triage Notes (Signed)
Pt c/o vaginal itching and burning with urination for the last couple of days; pt states she just finished a round of medication for a bacterial infection and pt is also c/o migraine

## 2017-04-27 NOTE — ED Notes (Signed)
Patient states she is wanting to leave because she has been waiting for too long for the reason she is here. Explained to patient acuity/wait times and apologized for her wait. Pt left with steady gait. NAD noted.

## 2017-05-14 ENCOUNTER — Other Ambulatory Visit: Payer: Self-pay | Admitting: Adult Health

## 2017-06-15 ENCOUNTER — Other Ambulatory Visit: Payer: Self-pay | Admitting: Adult Health

## 2017-07-04 ENCOUNTER — Ambulatory Visit (HOSPITAL_COMMUNITY)
Admission: EM | Admit: 2017-07-04 | Discharge: 2017-07-04 | Disposition: A | Discharge status: Home / Self Care | Payer: BLUE CROSS/BLUE SHIELD | Attending: Emergency Medicine | Admitting: Emergency Medicine

## 2017-07-04 ENCOUNTER — Encounter (HOSPITAL_COMMUNITY): Payer: Self-pay | Admitting: *Deleted

## 2017-07-04 DIAGNOSIS — N939 Abnormal uterine and vaginal bleeding, unspecified: Secondary | ICD-10-CM | POA: Diagnosis not present

## 2017-07-04 DIAGNOSIS — Z975 Presence of (intrauterine) contraceptive device: Secondary | ICD-10-CM | POA: Diagnosis not present

## 2017-07-04 LAB — POCT I-STAT, CHEM 8
BUN: 10 mg/dL (ref 6–20)
CHLORIDE: 109 mmol/L (ref 101–111)
CREATININE: 0.8 mg/dL (ref 0.44–1.00)
Calcium, Ion: 1.19 mmol/L (ref 1.15–1.40)
GLUCOSE: 78 mg/dL (ref 65–99)
HCT: 39 % (ref 36.0–46.0)
HEMOGLOBIN: 13.3 g/dL (ref 12.0–15.0)
POTASSIUM: 3.7 mmol/L (ref 3.5–5.1)
Sodium: 142 mmol/L (ref 135–145)
TCO2: 23 mmol/L (ref 22–32)

## 2017-07-04 LAB — POCT PREGNANCY, URINE: Preg Test, Ur: NEGATIVE

## 2017-07-04 MED ORDER — MEGESTROL ACETATE 40 MG PO TABS: 40.0000 mg | ORAL_TABLET | Freq: Two times a day (BID) | ORAL | 0 refills | Status: DC

## 2017-07-04 NOTE — ED Provider Notes (Signed)
[facility]   [id number] 07/04/17 Arrival Time: 1804   SUBJECTIVE:  Lauren Sherman is a 21 y.o. female who presents to the urgent care with complaint of vaginal bleeding ongoing for 1 month. This is been a continual problem for her images prescription for Megace to take when she has bleeding. The bleeding has worsened over the last 5 days, stating she is needed to change her pad hourly. She has Nexplanon implant, she is sexually active, does not use condoms. No nausea or vomiting, no fever or chills, no dysuria, or pain with intercourse, also no flank pain.     Past Medical History:  Diagnosis Date  . BV (bacterial vaginosis) 07/13/2016  . Chlamydia infection 07/13/2016  . Encounter for education about contraceptive use 12/17/2015  . Migraines   . Nexplanon insertion 01/22/2016   Inserted left arm 01/22/16   Social History   Social History  . Marital status: Single    Spouse name: N/A  . Number of children: N/A  . Years of education: N/A   Occupational History  . Not on file.   Social History Main Topics  . Smoking status: Former Smoker    Packs/day: 0.00    Types: Cigarettes  . Smokeless tobacco: Never Used  . Alcohol use Yes     Comment: occ  . Drug use: No  . Sexual activity: Yes    Partners: Male    Birth control/ protection: Implant   Other Topics Concern  . Not on file   Social History Narrative  . No narrative on file   Current Meds  Medication Sig  . etonogestrel (NEXPLANON) 68 MG IMPL implant 1 each by Subdermal route once.  . [DISCONTINUED] megestrol (MEGACE) 40 MG tablet TAKE 1 TABLET BY MOUTH ONCE DAILY AS NEEDED FOR BLEEDING.   Allergies  Allergen Reactions  . Cherry Itching and Other (See Comments)    Burning   . Peanut-Containing Drug Products Itching      ROS: As per HPI, remainder of ROS negative.   OBJECTIVE:  Vitals:   07/04/17 1841  BP: 126/74  Pulse: 69  Resp: 17  Temp: 98.5 F (36.9 C)  TempSrc: Oral  SpO2: 100%        General Appearance:  awake, alert, oriented, in no acute distress, well developed, well nourished and in no acute distress Skin:  skin color, texture, turgor are normal Ears:  External- bilateral-  normal Back:  no pain to palpation, no flank pain Lungs:  Normal expansion.  Clear to auscultation.  No rales, rhonchi, or wheezing. Heart:  Heart regular rate and rhythm Abdomen:  Soft, nontender, nondistended, bowel sounds present Extremities: Extremities warm to touch, pink, with no edema. Peripheral Pulses:  Capillary refill <2secs, strong peripheral pulses Urogen: Deferred   Labs:  Labs Reviewed  POCT PREGNANCY, URINE  POCT I-STAT, CHEM 8    No results found.     ASSESSMENT & PLAN:  1. Abnormal uterine bleeding (AUB)     Meds ordered this encounter  Medications  . megestrol (MEGACE) 40 MG tablet    Sig: Take 1 tablet (40 mg total) by mouth 2 (two) times daily.    Dispense:  20 tablet    Refill:  0    Order Specific Question:   Supervising Provider    Answer:   Sharlene DoryWENDLING, NICHOLAS PAUL [[id number]]   Pregnancy test negative, no evidence of anemia on i-STAT, we'll increase her Megace does, encourage her to follow-up with her gynecologist and  keep her appointment on September 6   Reviewed expectations re: course of current medical issues. Questions answered. Outlined signs and symptoms indicating need for more acute intervention. Patient verbalized understanding. After Visit Summary given.    Procedures:        Dorena Bodo, NP 07/04/17 1943

## 2017-07-04 NOTE — Discharge Instructions (Signed)
Laboratory work is normal here, meaning not showing signs of anemia. I'm increasing your Megace from once daily to twice daily, and I am encouraging keep your appointment with your gynecologist on September 6

## 2017-07-04 NOTE — ED Triage Notes (Signed)
Patient reports vaginal bleeding x 1 month, was given prescription for megestrol 40mg , states that bleeding has increased today, states has needed to change pad/tampon every hour. Reports blood clots. Reports abdominal cramping.

## 2017-07-08 ENCOUNTER — Other Ambulatory Visit: Payer: BLUE CROSS/BLUE SHIELD | Admitting: Adult Health

## 2017-07-09 ENCOUNTER — Other Ambulatory Visit: Payer: Self-pay | Admitting: Adult Health

## 2017-07-09 ENCOUNTER — Encounter: Payer: Self-pay | Admitting: Adult Health

## 2017-07-22 ENCOUNTER — Other Ambulatory Visit: Payer: BLUE CROSS/BLUE SHIELD | Admitting: Adult Health

## 2017-08-02 ENCOUNTER — Encounter: Payer: Self-pay | Admitting: Adult Health

## 2017-08-04 ENCOUNTER — Other Ambulatory Visit: Payer: BLUE CROSS/BLUE SHIELD | Admitting: Adult Health

## 2017-08-09 ENCOUNTER — Other Ambulatory Visit: Payer: BLUE CROSS/BLUE SHIELD | Admitting: Adult Health

## 2017-08-23 IMAGING — CT CT HEAD W/O CM
4 series · 17 of 47 positions shown, 19 images · non-contrast
Comparison: None.

CLINICAL DATA: Sudden onset of left side headache starting today

EXAM:
CT HEAD WITHOUT CONTRAST
TECHNIQUE: Contiguous axial images were obtained from the base of the skull
through the vertex without intravenous contrast.

[Series 2: head w/o · axial · non-contrast · 0.39mm/px · z∈[+1130,+1242]mm · 8 of 37 slices shown, 10 images]
[im 5/37  brain]
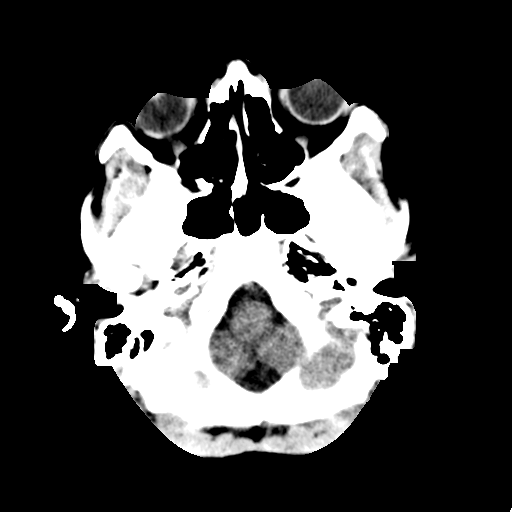
[im 5/37  bone]
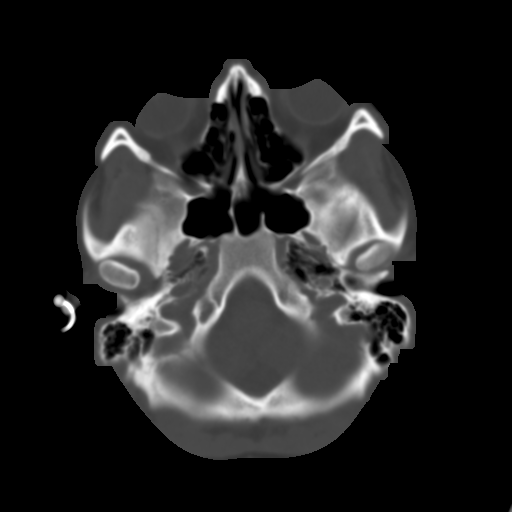
[im 9/37  brain]
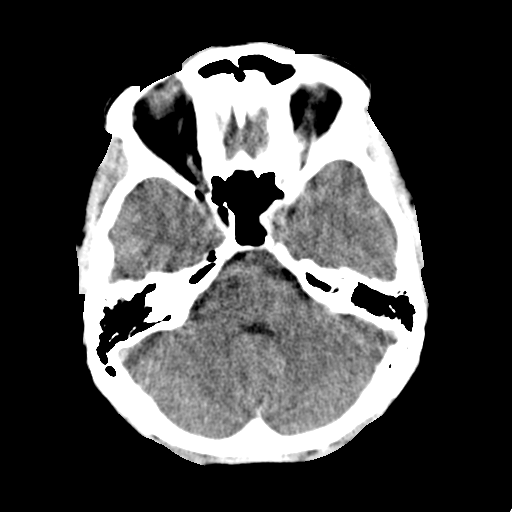
[im 13/37  brain]
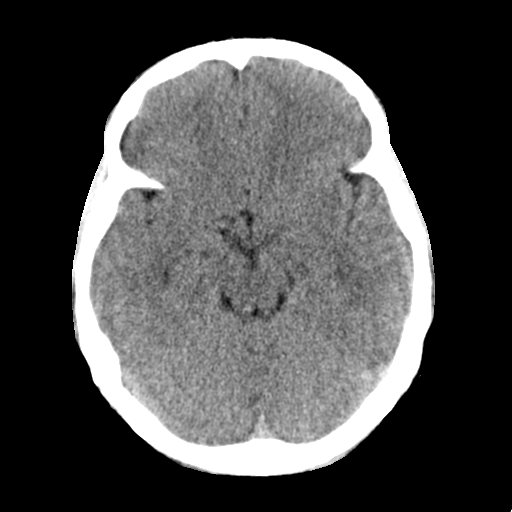
[im 17/37  brain]
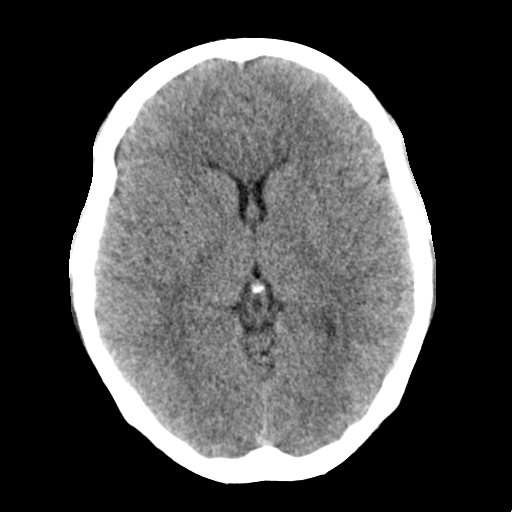
[im 21/37  brain]
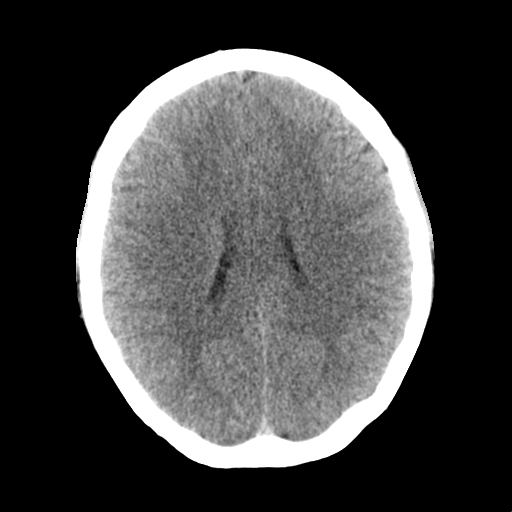
[im 21/37  bone]
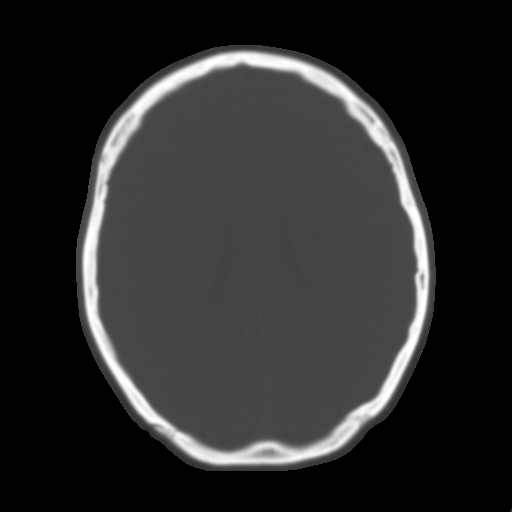
[im 25/37  brain]
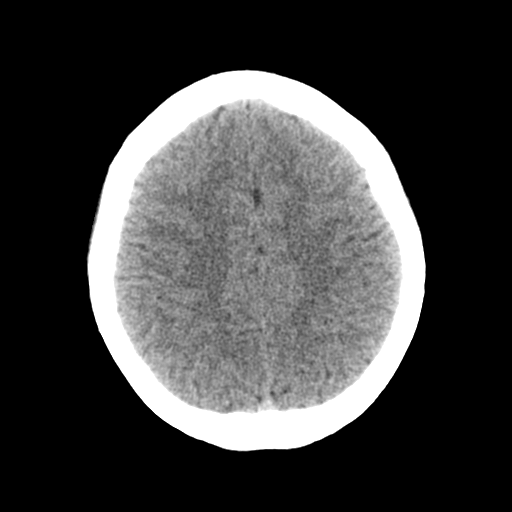
[im 29/37  brain]
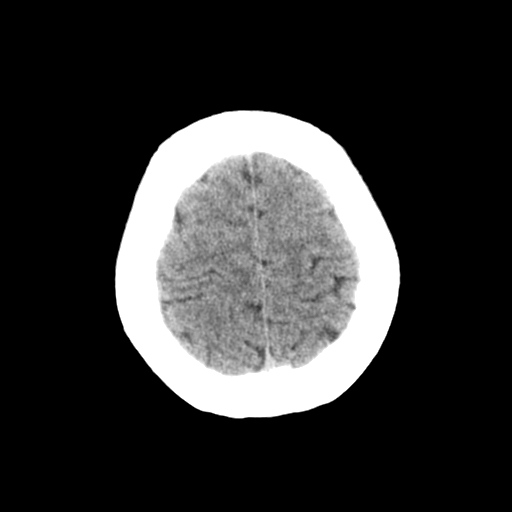
[im 33/37  brain]
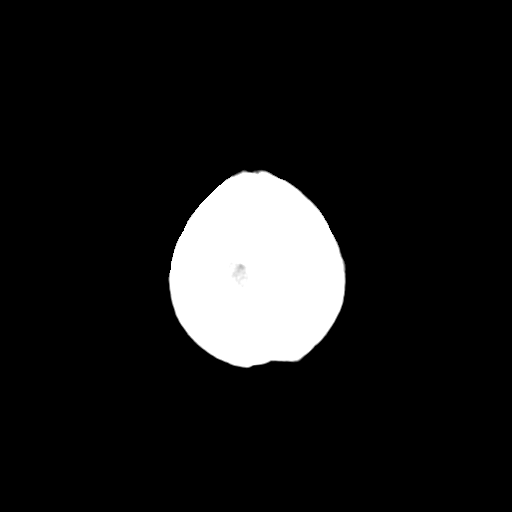

[Series 3: head bone · axial · 0.39mm/px · z∈[+1128,+1160]mm · 3 of 74 slices shown]
[im 8/74  bone]
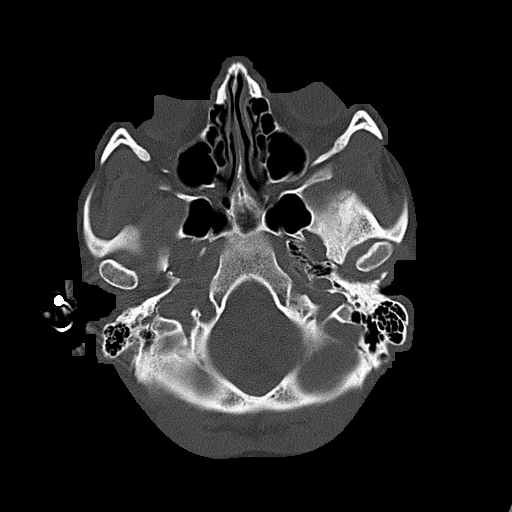
[im 16/74  bone]
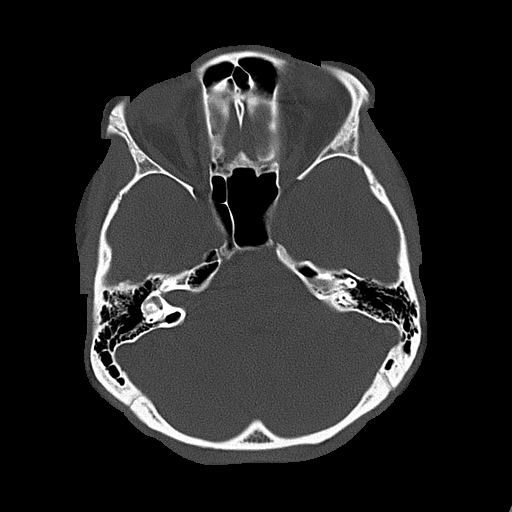
[im 24/74  bone]
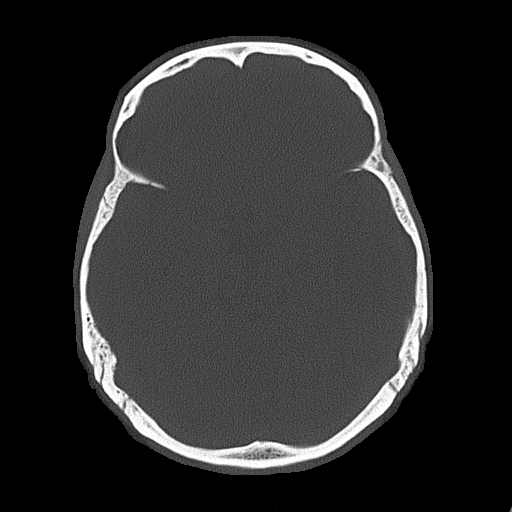

[Series 4: coronal · coronal · 0.33mm/px · 3 of 66 slices shown]
[im 22/66  brain]
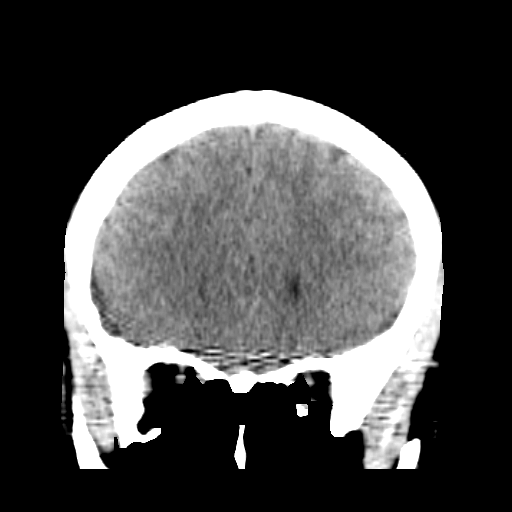
[im 29/66  brain]
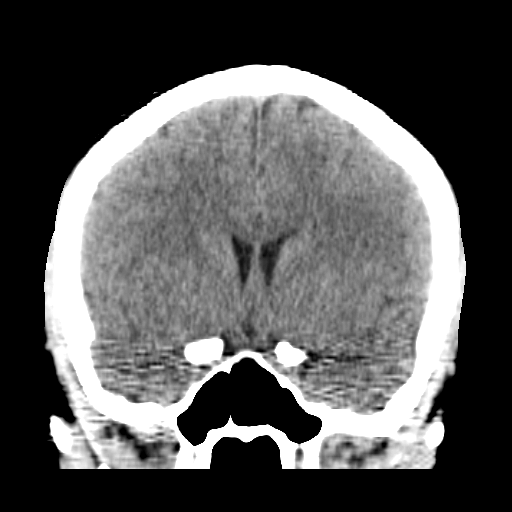
[im 37/66  brain]
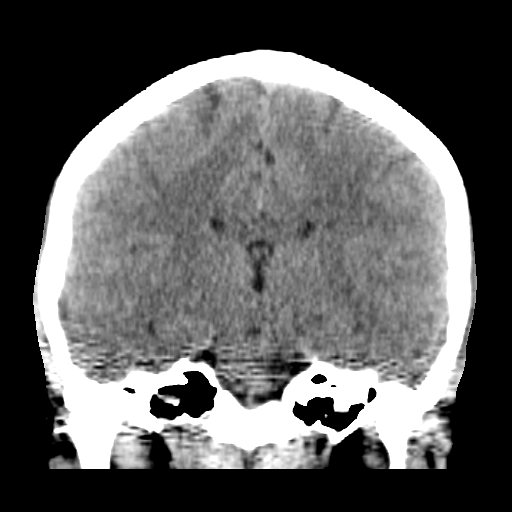

[Series 5: sagittal · sagittal · 0.33mm/px · 3 of 67 slices shown]
[im 23/67  brain]
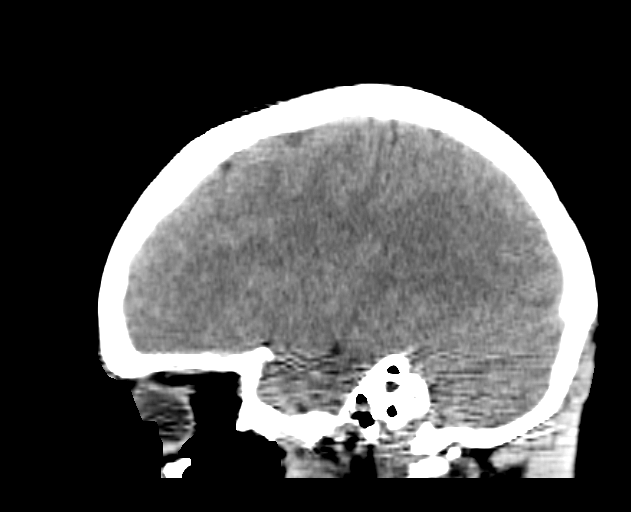
[im 34/67  brain]
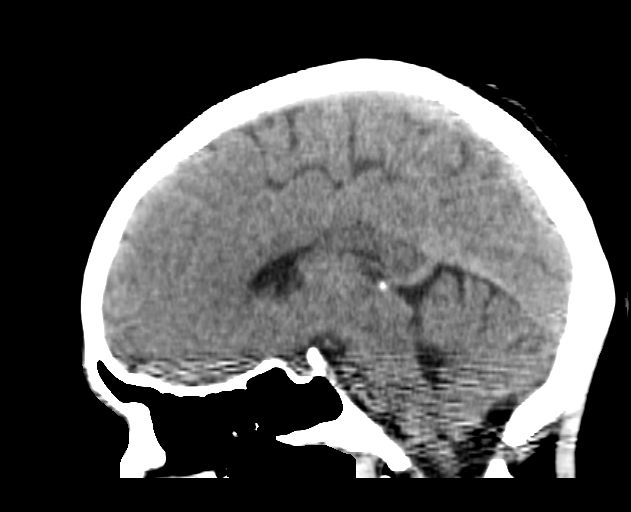
[im 45/67  brain]
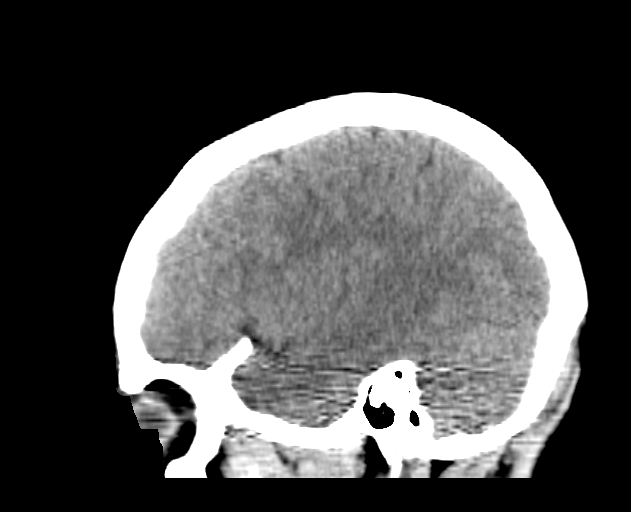

[17 of 47 positions shown; findings below may reference images not displayed]

FINDINGS: Brain: No intracranial hemorrhage, mass effect or midline shift. No
acute cortical infarction. No mass lesion is noted on this
unenhanced scan. No hydrocephalus.

Vascular: No hyperdense vessel or unexpected calcification.

Skull: No skull fracture is noted.

Sinuses/Orbits: Paranasal sinuses and mastoid air cells are
unremarkable.

Other: None
IMPRESSION: No acute intracranial abnormality.

## 2017-09-28 ENCOUNTER — Other Ambulatory Visit: Payer: BLUE CROSS/BLUE SHIELD | Admitting: Adult Health

## 2017-09-28 ENCOUNTER — Encounter: Payer: Self-pay | Admitting: *Deleted

## 2017-09-30 ENCOUNTER — Ambulatory Visit (HOSPITAL_COMMUNITY)
Admission: EM | Admit: 2017-09-30 | Discharge: 2017-09-30 | Disposition: A | Discharge status: Home / Self Care | Payer: BLUE CROSS/BLUE SHIELD | Attending: Internal Medicine | Admitting: Internal Medicine

## 2017-09-30 ENCOUNTER — Encounter (HOSPITAL_COMMUNITY): Payer: Self-pay | Admitting: Emergency Medicine

## 2017-09-30 ENCOUNTER — Ambulatory Visit: Payer: BLUE CROSS/BLUE SHIELD | Admitting: Adult Health

## 2017-09-30 ENCOUNTER — Other Ambulatory Visit: Payer: Self-pay

## 2017-09-30 DIAGNOSIS — N390 Urinary tract infection, site not specified: Secondary | ICD-10-CM | POA: Diagnosis not present

## 2017-09-30 LAB — POCT URINALYSIS DIP (DEVICE)
BILIRUBIN URINE: NEGATIVE
Glucose, UA: NEGATIVE mg/dL
KETONES UR: NEGATIVE mg/dL
Nitrite: POSITIVE — AB
PH: 7 (ref 5.0–8.0)
PROTEIN: NEGATIVE mg/dL
Specific Gravity, Urine: 1.02 (ref 1.005–1.030)
Urobilinogen, UA: 0.2 mg/dL (ref 0.0–1.0)

## 2017-09-30 LAB — POCT PREGNANCY, URINE: PREG TEST UR: NEGATIVE

## 2017-09-30 MED ORDER — CEPHALEXIN 500 MG PO CAPS: 500.0000 mg | ORAL_CAPSULE | Freq: Three times a day (TID) | ORAL | 0 refills | Status: AC

## 2017-09-30 MED ORDER — PHENAZOPYRIDINE HCL 200 MG PO TABS: 200.0000 mg | ORAL_TABLET | Freq: Three times a day (TID) | ORAL | 0 refills | Status: DC | PRN

## 2017-09-30 NOTE — ED Triage Notes (Signed)
Pt c/o UTI symptoms since yesterday, frequency, urgency, burning.

## 2017-09-30 NOTE — ED Provider Notes (Signed)
[facility]    CSN: [id number] Arrival date & time: 09/30/17  1508     History   Chief Complaint Chief Complaint  Patient presents with  . Urinary Frequency  . Dysuria    HPI Lauren Sherman is a 21 y.o. female.   21 year-old female, presenting today due to urinary symptoms. She states that she has had dysuria, hematuria, urinary frequency and urgency for the past two days. History of UTIs and this feels the same No abdominal pain, flank pain, fever, chills, nausea, vomiting, vaginal bleeding/discharge    The history is provided by the patient.  Urinary Frequency  This is a new problem. The current episode started 2 days ago. The problem occurs constantly. The problem has not changed since onset.Pertinent negatives include no chest pain, no abdominal pain, no headaches and no shortness of breath. Nothing aggravates the symptoms. Nothing relieves the symptoms. She has tried nothing for the symptoms. The treatment provided no relief.    Past Medical History:  Diagnosis Date  . BV (bacterial vaginosis) 07/13/2016  . Chlamydia infection 07/13/2016  . Encounter for education about contraceptive use 12/17/2015  . Migraines   . Nexplanon insertion 01/22/2016   Inserted left arm 01/22/16    Patient Active Problem List   Diagnosis Date Noted  . Chlamydia infection 07/13/2016  . BV (bacterial vaginosis) 07/13/2016  . Encounter for gynecological examination with Papanicolaou smear of cervix 07/07/2016  . Nexplanon in place 07/07/2016  . Nexplanon insertion 01/22/2016  . Encounter for education about contraceptive use 12/17/2015  . Sprain of ankle, unspecified site 02/13/2008    History reviewed. No pertinent surgical history.  OB History    Gravida Para Term Preterm AB Living   0 0 0 0 0 0   SAB TAB Ectopic Multiple Live Births   0 0 0 0         Home Medications    Prior to Admission medications   Medication Sig Start Date End Date Taking? Authorizing  Provider  etonogestrel (NEXPLANON) 68 MG IMPL implant 1 each by Subdermal route once.   Yes [provider]  cephALEXin (KEFLEX) 500 MG capsule Take 1 capsule (500 mg total) by mouth 3 (three) times daily for 7 days. 09/30/17 10/07/17  Blue, Olivia C, PA-C  diphenhydrAMINE (BENADRYL) 25 mg capsule Take 25 mg by mouth every 6 (six) hours as needed.    [provider]  megestrol (MEGACE) 40 MG tablet TAKE 1 TABLET BY MOUTH ONCE DAILY AS NEEDED FOR BLEEDING. 07/12/17   Adline PotterGriffin, Jennifer A, NP  phenazopyridine (PYRIDIUM) 200 MG tablet Take 1 tablet (200 mg total) by mouth 3 (three) times daily as needed for pain. 09/30/17   Blue, Marylene Landlivia C, PA-C    Family History Family History  Problem Relation Age of Onset  . Cancer Paternal Grandmother        breast  . Cancer Other     Social History Social History   Tobacco Use  . Smoking status: Former Smoker    Packs/day: 0.00    Types: Cigarettes  . Smokeless tobacco: Never Used  Substance Use Topics  . Alcohol use: Yes    Comment: occ  . Drug use: No     Allergies   Cherry and Peanut-containing drug products   Review of Systems Review of Systems  Constitutional: Negative for chills and fever.  HENT: Negative for ear pain and sore throat.   Eyes: Negative for pain and visual disturbance.  Respiratory:  Negative for cough and shortness of breath.   Cardiovascular: Negative for chest pain and palpitations.  Gastrointestinal: Negative for abdominal pain and vomiting.  Genitourinary: Positive for dysuria, frequency and urgency. Negative for hematuria.  Musculoskeletal: Negative for arthralgias and back pain.  Skin: Negative for color change and rash.  Neurological: Negative for seizures, syncope and headaches.  All other systems reviewed and are negative.    Physical Exam Triage Vital Signs ED Triage Vitals [09/30/17 1525]  Enc Vitals Group     BP 112/73     Pulse Rate 73     Resp 18     Temp 97.9 F (36.6 C)      Temp src      SpO2 100 %     Weight      Height      Head Circumference      Peak Flow      Pain Score      Pain Loc      Pain Edu?      Excl. in GC?    No data found.  Updated Vital Signs BP 112/73   Pulse 73   Temp 97.9 F (36.6 C)   Resp 18   LMP 08/30/2017   SpO2 100%   Visual Acuity Right Eye Distance:   Left Eye Distance:   Bilateral Distance:    Right Eye Near:   Left Eye Near:    Bilateral Near:     Physical Exam  Constitutional: She appears well-developed and well-nourished. No distress.  HENT:  Head: Normocephalic and atraumatic.  Eyes: Conjunctivae are normal.  Neck: Neck supple.  Cardiovascular: Normal rate and regular rhythm.  No murmur heard. Pulmonary/Chest: Effort normal and breath sounds normal. No respiratory distress.  Abdominal: Soft. There is no tenderness.  No abdominal tenderness or CVA tenderness    Musculoskeletal: She exhibits no edema.  Neurological: She is alert.  Skin: Skin is warm and dry.  Psychiatric: She has a normal mood and affect.  Nursing note and vitals reviewed.    UC Treatments / Results  Labs (all labs ordered are listed, but only abnormal results are displayed) Labs Reviewed  POCT URINALYSIS DIP (DEVICE) - Abnormal; Notable for the following components:      Result Value   Hgb urine dipstick TRACE (*)    Nitrite POSITIVE (*)    Leukocytes, UA SMALL (*)    All other components within normal limits  POCT PREGNANCY, URINE    EKG  EKG Interpretation None       Radiology No results found.  Procedures Procedures (including critical care time)  Medications Ordered in UC Medications - No data to display   Initial Impression / Assessment and Plan / UC Course  I have reviewed the triage vital signs and the nursing notes.  Pertinent labs & imaging results that were available during my care of the patient were reviewed by me and considered in my medical decision making (see chart for details).      UA positive for UTI - Keflex and pyridium   Final Clinical Impressions(s) / UC Diagnoses   Final diagnoses:  Acute UTI    ED Discharge Orders        Ordered    cephALEXin (KEFLEX) 500 MG capsule  3 times daily     09/30/17 1538    phenazopyridine (PYRIDIUM) 200 MG tablet  3 times daily PRN     09/30/17 1538       Controlled Substance Prescriptions  [place] Controlled Substance Registry consulted? Not Applicable   Alecia LemmingBlue, Olivia C, [place]PA-C 09/30/17 1542

## 2017-10-04 ENCOUNTER — Encounter: Payer: Self-pay | Admitting: Adult Health

## 2017-10-07 ENCOUNTER — Encounter: Payer: Self-pay | Admitting: *Deleted

## 2017-10-07 ENCOUNTER — Ambulatory Visit: Payer: BLUE CROSS/BLUE SHIELD | Admitting: Adult Health

## 2017-10-20 ENCOUNTER — Ambulatory Visit (HOSPITAL_COMMUNITY)
Admission: EM | Admit: 2017-10-20 | Discharge: 2017-10-20 | Disposition: A | Discharge status: Home / Self Care | Payer: BLUE CROSS/BLUE SHIELD | Attending: Family Medicine | Admitting: Family Medicine

## 2017-10-20 ENCOUNTER — Encounter (HOSPITAL_COMMUNITY): Payer: Self-pay | Admitting: Emergency Medicine

## 2017-10-20 ENCOUNTER — Other Ambulatory Visit: Payer: Self-pay

## 2017-10-20 DIAGNOSIS — S39012A Strain of muscle, fascia and tendon of lower back, initial encounter: Secondary | ICD-10-CM

## 2017-10-20 DIAGNOSIS — T148XXA Other injury of unspecified body region, initial encounter: Secondary | ICD-10-CM

## 2017-10-20 MED ORDER — CYCLOBENZAPRINE HCL 10 MG PO TABS: 5.0000 mg | ORAL_TABLET | Freq: Every evening | ORAL | 0 refills | Status: DC | PRN

## 2017-10-20 MED ORDER — NAPROXEN 500 MG PO TABS: 500.0000 mg | ORAL_TABLET | Freq: Two times a day (BID) | ORAL | 0 refills | Status: AC

## 2017-10-20 MED ORDER — KETOROLAC TROMETHAMINE 30 MG/ML IJ SOLN
30.0000 mg | Freq: Once | INTRAMUSCULAR | Status: AC
  Administered 2017-10-20: 30 mg via INTRAMUSCULAR

## 2017-10-20 NOTE — Discharge Instructions (Signed)
Your exam was most consistent with muscle strain. Start naproxen as directed. Flexeril as needed at night. Flexeril can make you drowsy, so do not take if you are going to drive, operate heavy machinery, or make important decisions. Ice/heat compresses as needed. This can take up to 3-4 weeks to completely resolve, but you should be feeling better each week. Follow up here or with PCP if symptoms worsen, changes for reevaluation. If experience numbness/tingling of the inner thighs, loss of bladder or bowel control, go to the emergency department for evaluation.  ° °

## 2017-10-20 NOTE — ED Provider Notes (Signed)
[facility]    CSN: [id number] Arrival date & time: 10/20/17  1503     History   Chief Complaint Chief Complaint  Patient presents with  . Back Pain    HPI Lauren Sherman is a 21 y.o. female.   [age] year old female comes in for 2 day history of mid back pain. Denies trauma/injury. Describes pain as stabbing, aching, cramping that is intermittent in nature. Worse with movement. otc ibuprofen 400-600mg  QD when pain starts without relief. Denies urinary symptoms such as frequency, dysuria, hematuria. Denies fever, chills, night sweats. Denies numbness/tingling, loss of bladder bowel control. Denies nausea, vomiting. Job requires pulling/tugging of carts, with bending motions.       Past Medical History:  Diagnosis Date  . BV (bacterial vaginosis) 07/13/2016  . Chlamydia infection 07/13/2016  . Encounter for education about contraceptive use 12/17/2015  . Migraines   . Nexplanon insertion 01/22/2016   Inserted left arm 01/22/16    Patient Active Problem List   Diagnosis Date Noted  . Chlamydia infection 07/13/2016  . BV (bacterial vaginosis) 07/13/2016  . Encounter for gynecological examination with Papanicolaou smear of cervix 07/07/2016  . Nexplanon in place 07/07/2016  . Nexplanon insertion 01/22/2016  . Encounter for education about contraceptive use 12/17/2015  . Sprain of ankle, unspecified site 02/13/2008    History reviewed. No pertinent surgical history.  OB History    Gravida Para Term Preterm AB Living   0 0 0 0 0 0   SAB TAB Ectopic Multiple Live Births   0 0 0 0         Home Medications    Prior to Admission medications   Medication Sig Start Date End Date Taking? Authorizing Provider  cyclobenzaprine (FLEXERIL) 10 MG tablet Take 0.5-1 tablets (5-10 mg total) by mouth at bedtime as needed for muscle spasms. 10/20/17   Cathie HoopsYu, Aveyah Greenwood V, PA-C  diphenhydrAMINE (BENADRYL) 25 mg capsule Take 25 mg by mouth every 6 (six) hours as needed.    [provider]  etonogestrel (NEXPLANON) 68 MG IMPL implant 1 each by Subdermal route once.    [provider]  megestrol (MEGACE) 40 MG tablet TAKE 1 TABLET BY MOUTH ONCE DAILY AS NEEDED FOR BLEEDING. 07/12/17   Adline PotterGriffin, Jennifer A, NP  naproxen (NAPROSYN) 500 MG tablet Take 1 tablet (500 mg total) by mouth 2 (two) times daily for 10 days. 10/20/17 10/30/17  Belinda FisherYu, Gricel Copen V, PA-C  phenazopyridine (PYRIDIUM) 200 MG tablet Take 1 tablet (200 mg total) by mouth 3 (three) times daily as needed for pain. 09/30/17   Blue, Marylene Landlivia C, PA-C    Family History Family History  Problem Relation Age of Onset  . Cancer Paternal Grandmother        breast  . Cancer Other     Social History Social History   Tobacco Use  . Smoking status: Former Smoker    Packs/day: 0.00    Types: Cigarettes  . Smokeless tobacco: Never Used  Substance Use Topics  . Alcohol use: Yes    Comment: occ  . Drug use: No     Allergies   Cherry and Peanut-containing drug products   Review of Systems Review of Systems  Reason unable to perform ROS: See HPI as above.     Physical Exam Triage Vital Signs ED Triage Vitals  Enc Vitals Group     BP 10/20/17 1533 117/72     Pulse Rate 10/20/17 1533 81  Resp 10/20/17 1533 14     Temp 10/20/17 1533 98 F (36.7 C)     Temp src --      SpO2 10/20/17 1533 100 %     Weight --      Height --      Head Circumference --      Peak Flow --      Pain Score 10/20/17 1534 7     Pain Loc --      Pain Edu? --      Excl. in GC? --    No data found.  Updated Vital Signs BP 117/72   Pulse 81   Temp 98 F (36.7 C)   Resp 14   SpO2 100%   Physical Exam  Constitutional: She is oriented to person, place, and time. She appears well-developed and well-nourished. No distress.  HENT:  Head: Normocephalic and atraumatic.  Eyes: Conjunctivae are normal. Pupils are equal, round, and reactive to light.  Cardiovascular: Normal rate, regular rhythm and normal heart  sounds. Exam reveals no gallop and no friction rub.  No murmur heard. Pulmonary/Chest: Effort normal and breath sounds normal. She has no wheezes. She has no rales.  Musculoskeletal:  No tenderness on palpation of the spinous processes. Tenderness on palpation of thoracic region bilaterally. Full range of motion of back and hip. Strength normal and equal bilaterally. Sensation intact and equal bilaterally. Negative straight leg raise. Negative CVA tenderness  Neurological: She is alert and oriented to person, place, and time.  Skin: Skin is warm and dry.     UC Treatments / Results  Labs (all labs ordered are listed, but only abnormal results are displayed) Labs Reviewed - No data to display  EKG  EKG Interpretation None       Radiology No results found.  Procedures Procedures (including critical care time)  Medications Ordered in UC Medications  ketorolac (TORADOL) 30 MG/ML injection 30 mg (30 mg Intramuscular Given 10/20/17 1557)     Initial Impression / Assessment and Plan / UC Course  I have reviewed the triage vital signs and the nursing notes.  Pertinent labs & imaging results that were available during my care of the patient were reviewed by me and considered in my medical decision making (see chart for details).    Discussed with patient history and exam most consistent with muscle strain. Start NSAID as directed for pain and inflammation. Muscle relaxant as needed. Ice/heat compresses. Discussed with patient strain can take up to 3-4 weeks to resolve, but should be getting better each week. Return precautions given.   Final Clinical Impressions(s) / UC Diagnoses   Final diagnoses:  Muscle strain    ED Discharge Orders        Ordered    naproxen (NAPROSYN) 500 MG tablet  2 times daily     10/20/17 1555    cyclobenzaprine (FLEXERIL) 10 MG tablet  At bedtime PRN     10/20/17 1555        Belinda FisherYu, Madasyn Heath V, PA-C 10/20/17 1607

## 2017-10-20 NOTE — ED Triage Notes (Signed)
Pt c/o pain in the middle of her back sometimes on the L side. Hurts worse with bending over. Denies injury. Denies issues with urination.

## 2017-11-06 ENCOUNTER — Other Ambulatory Visit: Payer: Self-pay

## 2017-11-06 ENCOUNTER — Encounter (HOSPITAL_COMMUNITY): Payer: Self-pay | Admitting: Emergency Medicine

## 2017-11-06 ENCOUNTER — Ambulatory Visit (HOSPITAL_COMMUNITY)
Admission: EM | Admit: 2017-11-06 | Discharge: 2017-11-06 | Disposition: A | Discharge status: Home / Self Care | Payer: BLUE CROSS/BLUE SHIELD | Attending: Family Medicine | Admitting: Family Medicine

## 2017-11-06 DIAGNOSIS — J111 Influenza due to unidentified influenza virus with other respiratory manifestations: Secondary | ICD-10-CM

## 2017-11-06 DIAGNOSIS — Z87891 Personal history of nicotine dependence: Secondary | ICD-10-CM | POA: Insufficient documentation

## 2017-11-06 DIAGNOSIS — Z91018 Allergy to other foods: Secondary | ICD-10-CM | POA: Diagnosis not present

## 2017-11-06 DIAGNOSIS — Z91010 Allergy to peanuts: Secondary | ICD-10-CM | POA: Insufficient documentation

## 2017-11-06 DIAGNOSIS — R05 Cough: Secondary | ICD-10-CM | POA: Insufficient documentation

## 2017-11-06 DIAGNOSIS — R059 Cough, unspecified: Secondary | ICD-10-CM

## 2017-11-06 LAB — POCT RAPID STREP A: STREPTOCOCCUS, GROUP A SCREEN (DIRECT): NEGATIVE

## 2017-11-06 MED ORDER — ACETAMINOPHEN 325 MG PO TABS
650.0000 mg | ORAL_TABLET | Freq: Once | ORAL | Status: AC
  Administered 2017-11-06: 650 mg via ORAL

## 2017-11-06 MED ORDER — OSELTAMIVIR PHOSPHATE 75 MG PO CAPS: 75.0000 mg | ORAL_CAPSULE | Freq: Two times a day (BID) | ORAL | 0 refills | Status: DC

## 2017-11-06 MED ORDER — HYDROCODONE-HOMATROPINE 5-1.5 MG/5ML PO SYRP: 5.0000 mL | ORAL_SOLUTION | Freq: Four times a day (QID) | ORAL | 0 refills | Status: DC | PRN

## 2017-11-06 MED ORDER — ACETAMINOPHEN 325 MG PO TABS
ORAL_TABLET | ORAL | Status: AC
  Filled 2017-11-06: qty 2

## 2017-11-06 NOTE — ED Provider Notes (Signed)
[facility]   [id number] 11/06/17 Arrival Time: 1803  ASSESSMENT & PLAN:  1. Influenza   2. Cough     Meds ordered this encounter  Medications  . acetaminophen (TYLENOL) tablet 650 mg  . HYDROcodone-homatropine (HYCODAN) 5-1.5 MG/5ML syrup    Sig: Take 5 mLs by mouth every 6 (six) hours as needed for cough.    Dispense:  90 mL    Refill:  0  . oseltamivir (TAMIFLU) 75 MG capsule    Sig: Take 1 capsule (75 mg total) by mouth every 12 (twelve) hours.    Dispense:  10 capsule    Refill:  0   Medication sedation precautions. OTC symptom care as needed. Ensure adequate fluid intake and rest. May f/u with PCP or here as needed.  Reviewed expectations re: course of current medical issues. Questions answered. Outlined signs and symptoms indicating need for more acute intervention. Patient verbalized understanding. After Visit Summary given.   SUBJECTIVE: History from: patient.  Lauren Sherman is a 22 y.o. female who presents with complaint of nasal congestion, post-nasal drainage, and a persistent dry cough. Onset abrupt, approximately 2 days ago. Overall fatigued with body aches. SOB: none. Wheezing: none. Fever: yes, subjective. Overall normal PO intake without n/v. Sick contacts: yes. OTC treatment: cough med without relief.  Received flu shot this year: no. Social History   Tobacco Use  Smoking Status Former Smoker  . Packs/day: 0.00  . Types: Cigarettes  Smokeless Tobacco Never Used    ROS: As per HPI.   OBJECTIVE:  Vitals:   11/06/17 1825  BP: 118/88  Pulse: (!) 117  Resp: 20  Temp: (!) 100.6 F (38.1 C)  TempSrc: Oral  SpO2: 100%     General appearance: alert; appears fatigued HEENT: nasal congestion; clear runny nose; throat irritation secondary to post-nasal drainage Neck: supple without LAD Lungs: unlabored respirations, symmetrical air entry; cough: moderate; no respiratory distress Skin: warm and dry Psychological: alert and  cooperative; normal mood and affect  Imaging: No results found.  Allergies  Allergen Reactions  . Cherry Itching and Other (See Comments)    Burning   . Peanut-Containing Drug Products Itching    Past Medical History:  Diagnosis Date  . BV (bacterial vaginosis) 07/13/2016  . Chlamydia infection 07/13/2016  . Encounter for education about contraceptive use 12/17/2015  . Migraines   . Nexplanon insertion 01/22/2016   Inserted left arm 01/22/16   Family History  Problem Relation Age of Onset  . Cancer Paternal Grandmother        breast  . Cancer Other    Social History   Socioeconomic History  . Marital status: Single    Spouse name: Not on file  . Number of children: Not on file  . Years of education: Not on file  . Highest education level: Not on file  Social Needs  . Financial resource strain: Not on file  . Food insecurity - worry: Not on file  . Food insecurity - inability: Not on file  . Transportation needs - medical: Not on file  . Transportation needs - non-medical: Not on file  Occupational History  . Not on file  Tobacco Use  . Smoking status: Former Smoker    Packs/day: 0.00    Types: Cigarettes  . Smokeless tobacco: Never Used  Substance and Sexual Activity  . Alcohol use: Yes    Comment: occ  . Drug use: No  . Sexual activity: Yes    Partners: Male  Birth control/protection: Implant  Other Topics Concern  . Not on file  Social History Narrative  . Not on file           Mardella Layman, MD 11/06/17 [phone]

## 2017-11-06 NOTE — ED Triage Notes (Signed)
Onset 2 days ago of symptoms.  General aches, sore throat, headache, intermittent sweats.

## 2017-11-06 NOTE — Discharge Instructions (Signed)

## 2017-11-07 ENCOUNTER — Other Ambulatory Visit: Payer: Self-pay

## 2017-11-07 ENCOUNTER — Emergency Department (HOSPITAL_COMMUNITY)
Admission: EM | Admit: 2017-11-07 | Discharge: 2017-11-07 | Disposition: A | Discharge status: Home / Self Care | Payer: BLUE CROSS/BLUE SHIELD | Attending: Emergency Medicine | Admitting: Emergency Medicine

## 2017-11-07 ENCOUNTER — Encounter (HOSPITAL_COMMUNITY): Payer: Self-pay | Admitting: *Deleted

## 2017-11-07 DIAGNOSIS — M542 Cervicalgia: Secondary | ICD-10-CM | POA: Diagnosis not present

## 2017-11-07 DIAGNOSIS — Z79899 Other long term (current) drug therapy: Secondary | ICD-10-CM | POA: Diagnosis not present

## 2017-11-07 DIAGNOSIS — Z87891 Personal history of nicotine dependence: Secondary | ICD-10-CM | POA: Diagnosis not present

## 2017-11-07 DIAGNOSIS — F509 Eating disorder, unspecified: Secondary | ICD-10-CM | POA: Insufficient documentation

## 2017-11-07 DIAGNOSIS — R590 Localized enlarged lymph nodes: Secondary | ICD-10-CM | POA: Diagnosis not present

## 2017-11-07 DIAGNOSIS — R000 Tachycardia, unspecified: Secondary | ICD-10-CM | POA: Insufficient documentation

## 2017-11-07 DIAGNOSIS — R51 Headache: Secondary | ICD-10-CM | POA: Diagnosis not present

## 2017-11-07 DIAGNOSIS — M7918 Myalgia, other site: Secondary | ICD-10-CM | POA: Diagnosis not present

## 2017-11-07 DIAGNOSIS — J039 Acute tonsillitis, unspecified: Secondary | ICD-10-CM | POA: Diagnosis not present

## 2017-11-07 DIAGNOSIS — J029 Acute pharyngitis, unspecified: Secondary | ICD-10-CM | POA: Diagnosis not present

## 2017-11-07 DIAGNOSIS — M791 Myalgia, unspecified site: Secondary | ICD-10-CM | POA: Diagnosis not present

## 2017-11-07 LAB — CULTURE, GROUP A STREP (THRC)

## 2017-11-07 MED ORDER — KETOROLAC TROMETHAMINE 30 MG/ML IJ SOLN
30.0000 mg | Freq: Once | INTRAMUSCULAR | Status: AC
  Administered 2017-11-07: 30 mg via INTRAVENOUS
  Filled 2017-11-07: qty 1

## 2017-11-07 MED ORDER — DEXAMETHASONE SODIUM PHOSPHATE 10 MG/ML IJ SOLN
10.0000 mg | Freq: Four times a day (QID) | INTRAMUSCULAR | Status: DC
  Administered 2017-11-07: 10 mg via INTRAVENOUS
  Filled 2017-11-07: qty 1

## 2017-11-07 MED ORDER — IBUPROFEN 600 MG PO TABS: 600.0000 mg | ORAL_TABLET | Freq: Four times a day (QID) | ORAL | 0 refills | Status: DC | PRN

## 2017-11-07 MED ORDER — SODIUM CHLORIDE 0.9 % IV BOLUS (SEPSIS)
1000.0000 mL | Freq: Once | INTRAVENOUS | Status: AC
  Administered 2017-11-07: 1000 mL via INTRAVENOUS

## 2017-11-07 MED ORDER — PENICILLIN G BENZATHINE 1200000 UNIT/2ML IM SUSP
1.2000 10*6.[IU] | Freq: Once | INTRAMUSCULAR | Status: AC
  Administered 2017-11-07: 1.2 10*6.[IU] via INTRAMUSCULAR
  Filled 2017-11-07: qty 2

## 2017-11-07 NOTE — ED Triage Notes (Signed)
[facility]. Pt reports a fever last night  And has generalized body aches.

## 2017-11-07 NOTE — ED Triage Notes (Signed)
Pt was seen at [facility] last night and told she has the flu.  She doesn't feel she has the flu, she feels she has strep.  Test from yesterday was neg for strep.  R tonsil swollen and red with white exudate.  PT able to drink cold liquid.

## 2017-11-07 NOTE — ED Provider Notes (Signed)
[facility] EMERGENCY DEPARTMENT Provider Note   CSN: [id number] Arrival date & time: 11/07/17  1535     History   Chief Complaint No chief complaint on file.   HPI Lauren Sherman is a 22 y.o. female.  HPI Presents with 2 days of sore throat, fever, chills, diffuse body aches and headache.  She denies any congestion, cough.  Seen in urgent care yesterday evening and diagnosed with flulike illness.  Patient states she is having difficulty swallowing due to the amount of pain.  No shortness of breath. Past Medical History:  Diagnosis Date  . BV (bacterial vaginosis) 07/13/2016  . Chlamydia infection 07/13/2016  . Encounter for education about contraceptive use 12/17/2015  . Migraines   . Nexplanon insertion 01/22/2016   Inserted left arm 01/22/16    Patient Active Problem List   Diagnosis Date Noted  . Chlamydia infection 07/13/2016  . BV (bacterial vaginosis) 07/13/2016  . Encounter for gynecological examination with Papanicolaou smear of cervix 07/07/2016  . Nexplanon in place 07/07/2016  . Nexplanon insertion 01/22/2016  . Encounter for education about contraceptive use 12/17/2015  . Sprain of ankle, unspecified site 02/13/2008    History reviewed. No pertinent surgical history.  OB History    Gravida Para Term Preterm AB Living   0 0 0 0 0 0   SAB TAB Ectopic Multiple Live Births   0 0 0 0         Home Medications    Prior to Admission medications   Medication Sig Start Date End Date Taking? Authorizing Provider  cyclobenzaprine (FLEXERIL) 10 MG tablet Take 0.5-1 tablets (5-10 mg total) by mouth at bedtime as needed for muscle spasms. 10/20/17   Cathie Hoops, Amy V, PA-C  diphenhydrAMINE (BENADRYL) 25 mg capsule Take 25 mg by mouth every 6 (six) hours as needed.    [provider]  etonogestrel (NEXPLANON) 68 MG IMPL implant 1 each by Subdermal route once.    [provider]  ibuprofen (ADVIL,MOTRIN) 600 MG tablet Take 1 tablet (600 mg  total) by mouth every 6 (six) hours as needed for moderate pain. 11/07/17   Loren Racer, MD  megestrol (MEGACE) 40 MG tablet TAKE 1 TABLET BY MOUTH ONCE DAILY AS NEEDED FOR BLEEDING. 07/12/17   Adline Potter, NP    Family History Family History  Problem Relation Age of Onset  . Cancer Paternal Grandmother        breast  . Cancer Other     Social History Social History   Tobacco Use  . Smoking status: Former Smoker    Packs/day: 0.00    Types: Cigarettes  . Smokeless tobacco: Never Used  Substance Use Topics  . Alcohol use: Yes    Comment: occ  . Drug use: No     Allergies   Cherry and Peanut-containing drug products   Review of Systems Review of Systems  Constitutional: Positive for chills, fatigue and fever.  HENT: Positive for sore throat and trouble swallowing. Negative for congestion and voice change.   Respiratory: Negative for cough, shortness of breath and wheezing.   Cardiovascular: Negative for chest pain.  Gastrointestinal: Negative for abdominal pain, nausea and vomiting.  Genitourinary: Negative for dysuria and flank pain.  Musculoskeletal: Positive for myalgias and neck pain. Negative for back pain and neck stiffness.  Skin: Negative for rash and wound.  Neurological: Positive for headaches. Negative for dizziness, syncope, weakness and numbness.  All other systems reviewed and are negative.  Physical Exam Updated Vital Signs BP 124/85 (BP Location: Right Arm)   Pulse (!) 110   Temp 99.8 F (37.7 C) (Oral)   Resp 16   Ht 5\' 3"  (1.6 m)   Wt 67.1 kg (148 lb)   SpO2 100%   BMI 26.22 kg/m   Physical Exam  Constitutional: She is oriented to person, place, and time. She appears well-developed and well-nourished. No distress.  HENT:  Head: Normocephalic and atraumatic.  Mouth/Throat: Oropharyngeal exudate present.  Bilateral tonsillar hypertrophy with extensive exudates.  Uvula is midline.  Eyes: EOM are normal. Pupils are equal, round,  and reactive to light.  Neck: Normal range of motion. Neck supple.  No meningismus.  No stridor  Cardiovascular: Regular rhythm. Exam reveals no gallop and no friction rub.  No murmur heard. Tachycardia  Pulmonary/Chest: Effort normal and breath sounds normal. No stridor. No respiratory distress. She has no wheezes. She has no rales. She exhibits no tenderness.  Abdominal: Soft. Bowel sounds are normal. There is no tenderness. There is no rebound and no guarding.  Musculoskeletal: Normal range of motion. She exhibits no edema or tenderness.  Lymphadenopathy:    She has cervical adenopathy.  Neurological: She is alert and oriented to person, place, and time.  Moves all extremities without focal deficit.  Sensation fully intact.  Skin: Skin is warm and dry. Capillary refill takes less than 2 seconds. No rash noted. No erythema.  Psychiatric: She has a normal mood and affect. Her behavior is normal.  Nursing note and vitals reviewed.    ED Treatments / Results  Labs (all labs ordered are listed, but only abnormal results are displayed) Labs Reviewed  GC/CHLAMYDIA PROBE AMP ([place]) NOT AT [facility]    EKG  EKG Interpretation None       Radiology No results found.  Procedures Procedures (including critical care time)  Medications Ordered in ED Medications  dexamethasone (DECADRON) injection 10 mg (10 mg Intravenous Given 11/07/17 1831)  penicillin g benzathine (BICILLIN LA) 1200000 UNIT/2ML injection 1.2 Million Units (1.2 Million Units Intramuscular Given 11/07/17 1831)  sodium chloride 0.9 % bolus 1,000 mL (0 mLs Intravenous Stopped 11/07/17 1934)  ketorolac (TORADOL) 30 MG/ML injection 30 mg (30 mg Intravenous Given 11/07/17 1831)     Initial Impression / Assessment and Plan / ED Course  I have reviewed the triage vital signs and the nursing notes.  Pertinent labs & imaging results that were available during my care of the patient were reviewed by me and considered in my  medical decision making (see chart for details).     We will treat with IM penicillin, IV fluids and Decadron.   Patient states she is feeling much better.  Speaks in clear voice.  Tolerating secretions.  Able to swallow fluids.  Return precautions have been given. Final Clinical Impressions(s) / ED Diagnoses   Final diagnoses:  Tonsillitis    ED Discharge Orders        Ordered    ibuprofen (ADVIL,MOTRIN) 600 MG tablet  Every 6 hours PRN     11/07/17 1947       Loren RacerYelverton, Gabriana Wilmott, MD 11/07/17 1948

## 2017-11-08 ENCOUNTER — Ambulatory Visit: Payer: BLUE CROSS/BLUE SHIELD | Admitting: Adult Health

## 2017-11-08 ENCOUNTER — Telehealth (HOSPITAL_COMMUNITY): Payer: Self-pay | Admitting: Internal Medicine

## 2017-11-08 ENCOUNTER — Encounter: Payer: Self-pay | Admitting: Adult Health

## 2017-11-08 VITALS — BP 124/80 | HR 75 | Ht 63.0 in | Wt 151.0 lb

## 2017-11-08 DIAGNOSIS — N760 Acute vaginitis: Secondary | ICD-10-CM

## 2017-11-08 DIAGNOSIS — Z113 Encounter for screening for infections with a predominantly sexual mode of transmission: Secondary | ICD-10-CM

## 2017-11-08 DIAGNOSIS — B9689 Other specified bacterial agents as the cause of diseases classified elsewhere: Secondary | ICD-10-CM | POA: Diagnosis not present

## 2017-11-08 DIAGNOSIS — N898 Other specified noninflammatory disorders of vagina: Secondary | ICD-10-CM

## 2017-11-08 LAB — GC/CHLAMYDIA PROBE AMP (~~LOC~~) NOT AT ARMC
CHLAMYDIA, DNA PROBE: NEGATIVE
NEISSERIA GONORRHEA: POSITIVE — AB

## 2017-11-08 LAB — POCT WET PREP (WET MOUNT)
Clue Cells Wet Prep Whiff POC: POSITIVE
WBC WET PREP: POSITIVE

## 2017-11-08 MED ORDER — PENICILLIN V POTASSIUM 500 MG PO TABS: 500.0000 mg | ORAL_TABLET | Freq: Two times a day (BID) | ORAL | 0 refills | Status: DC

## 2017-11-08 MED ORDER — METRONIDAZOLE 500 MG PO TABS: 500.0000 mg | ORAL_TABLET | Freq: Three times a day (TID) | ORAL | 0 refills | Status: DC

## 2017-11-08 NOTE — Patient Instructions (Signed)
Bacterial Vaginosis Bacterial vaginosis is a vaginal infection that occurs when the normal balance of bacteria in the vagina is disrupted. It results from an overgrowth of certain bacteria. This is the most common vaginal infection among women ages 15-44. Because bacterial vaginosis increases your risk for STIs (sexually transmitted infections), getting treated can help reduce your risk for chlamydia, gonorrhea, herpes, and HIV (human immunodeficiency virus). Treatment is also important for preventing complications in pregnant women, because this condition can cause an early (premature) delivery. What are the causes? This condition is caused by an increase in harmful bacteria that are normally present in small amounts in the vagina. However, the reason that the condition develops is not fully understood. What increases the risk? The following factors may make you more likely to develop this condition:  Having a new sexual partner or multiple sexual partners.  Having unprotected sex.  Douching.  Having an intrauterine device (IUD).  Smoking.  Drug and alcohol abuse.  Taking certain antibiotic medicines.  Being pregnant.  You cannot get bacterial vaginosis from toilet seats, bedding, swimming pools, or contact with objects around you. What are the signs or symptoms? Symptoms of this condition include:  Grey or white vaginal discharge. The discharge can also be watery or foamy.  A fish-like odor with discharge, especially after sexual intercourse or during menstruation.  Itching in and around the vagina.  Burning or pain with urination.  Some women with bacterial vaginosis have no signs or symptoms. How is this diagnosed? This condition is diagnosed based on:  Your medical history.  A physical exam of the vagina.  Testing a sample of vaginal fluid under a microscope to look for a large amount of bad bacteria or abnormal cells. Your health care provider may use a cotton swab  or a small wooden spatula to collect the sample.  How is this treated? This condition is treated with antibiotics. These may be given as a pill, a vaginal cream, or a medicine that is put into the vagina (suppository). If the condition comes back after treatment, a second round of antibiotics may be needed. Follow these instructions at home: Medicines  Take over-the-counter and prescription medicines only as told by your health care provider.  Take or use your antibiotic as told by your health care provider. Do not stop taking or using the antibiotic even if you start to feel better. General instructions  If you have a female sexual partner, tell her that you have a vaginal infection. She should see her health care provider and be treated if she has symptoms. If you have a female sexual partner, he does not need treatment.  During treatment: ? Avoid sexual activity until you finish treatment. ? Do not douche. ? Avoid alcohol as directed by your health care provider. ? Avoid breastfeeding as directed by your health care provider.  Drink enough water and fluids to keep your urine clear or pale yellow.  Keep the area around your vagina and rectum clean. ? Wash the area daily with warm water. ? Wipe yourself from front to back after using the toilet.  Keep all follow-up visits as told by your health care provider. This is important. How is this prevented?  Do not douche.  Wash the outside of your vagina with warm water only.  Use protection when having sex. This includes latex condoms and dental dams.  Limit how many sexual partners you have. To help prevent bacterial vaginosis, it is best to have sex with just   one partner (monogamous).  Make sure you and your sexual partner are tested for STIs.  Wear cotton or cotton-lined underwear.  Avoid wearing tight pants and pantyhose, especially during summer.  Limit the amount of alcohol that you drink.  Do not use any products that  contain nicotine or tobacco, such as cigarettes and e-cigarettes. If you need help quitting, ask your health care provider.  Do not use illegal drugs. Where to find more information:  Centers for Disease Control and Prevention: www.cdc.gov/std  American Sexual Health Association (ASHA): www.ashastd.org  U.S. Department of Health and Human Services, Office on Women's Health: www.womenshealth.gov/ or https://www.womenshealth.gov/a-z-topics/bacterial-vaginosis Contact a health care provider if:  Your symptoms do not improve, even after treatment.  You have more discharge or pain when urinating.  You have a fever.  You have pain in your abdomen.  You have pain during sex.  You have vaginal bleeding between periods. Summary  Bacterial vaginosis is a vaginal infection that occurs when the normal balance of bacteria in the vagina is disrupted.  Because bacterial vaginosis increases your risk for STIs (sexually transmitted infections), getting treated can help reduce your risk for chlamydia, gonorrhea, herpes, and HIV (human immunodeficiency virus). Treatment is also important for preventing complications in pregnant women, because the condition can cause an early (premature) delivery.  This condition is treated with antibiotic medicines. These may be given as a pill, a vaginal cream, or a medicine that is put into the vagina (suppository). This information is not intended to replace advice given to you by your health care provider. Make sure you discuss any questions you have with your health care provider. Document Released: 10/19/2005 Document Revised: 02/22/2017 Document Reviewed: 07/04/2016 Elsevier Interactive Patient Education  2018 Elsevier Inc.  

## 2017-11-08 NOTE — Telephone Encounter (Signed)
Clinical staff, please let patient know that throat culture was positive for group A Strep germ.  Rx for penicillin V sent to the pharmacy of record, [facility] [place] on [address]  Take all of the medication.   Recheck for further evaluation if symptoms are not improving.  LM

## 2017-11-08 NOTE — Progress Notes (Signed)
Subjective:     Patient ID: Lauren Sherman, female   DOB: 09/21/1996, 22 y.o.   MRN: [id number]  HPI Lauren Sherman is a 22 year old biracial female in requesting STD testing.She was seen in ER yesterday and treated for strept throat and had throat swabbed for GC/CHL.  Review of Systems Has strept throat Has had same sex partner for 3 years,but wants STD testing Reviewed past medical,surgical, social and family history. Reviewed medications and allergies.      Objective:   Physical Exam BP 124/80 (BP Location: Left Arm, Patient Position: Sitting, Cuff Size: Normal)   Pulse 75   Ht 5\' 3"  (1.6 m)   Wt 151 lb (68.5 kg)   BMI 26.75 kg/m    Skin warm and dry.Pelvic: external genitalia is normal in appearance no lesions, vagina: white discharge with odor,urethra has no lesions or masses noted, cervix:smooth and bulbous, uterus: normal size, shape and contour, non tender, no masses felt, adnexa: no masses or tenderness noted. Bladder is non tender and no masses felt. Wet prep: + for clue cells and +WBCs. GC/CHL obtained.  Throat is red, swollen tonsils and lots of exudate. Assessment:     1. BV (bacterial vaginosis)   2. Screening examination for STD (sexually transmitted disease)   3. Vaginal odor   4. Vaginal discharge       Plan:     GC/CHL sent Check HIV and RPR Rx flagyl 500 mg 1 tid x 7 days, no alcohol, review handout on BV No sex  Return in 3 weeks for pap and physical

## 2017-11-09 ENCOUNTER — Ambulatory Visit (INDEPENDENT_AMBULATORY_CARE_PROVIDER_SITE_OTHER): Payer: BLUE CROSS/BLUE SHIELD | Admitting: *Deleted

## 2017-11-09 ENCOUNTER — Encounter: Payer: Self-pay | Admitting: *Deleted

## 2017-11-09 ENCOUNTER — Encounter: Payer: Self-pay | Admitting: Adult Health

## 2017-11-09 ENCOUNTER — Other Ambulatory Visit: Payer: Self-pay | Admitting: Adult Health

## 2017-11-09 DIAGNOSIS — A549 Gonococcal infection, unspecified: Secondary | ICD-10-CM | POA: Diagnosis not present

## 2017-11-09 LAB — RPR: RPR: NONREACTIVE

## 2017-11-09 LAB — HIV ANTIBODY (ROUTINE TESTING W REFLEX): HIV SCREEN 4TH GENERATION: NONREACTIVE

## 2017-11-09 MED ORDER — AZITHROMYCIN 500 MG PO TABS: ORAL_TABLET | ORAL | 0 refills | Status: DC

## 2017-11-09 MED ORDER — CEFTRIAXONE SODIUM 250 MG IJ SOLR
250.0000 mg | Freq: Once | INTRAMUSCULAR | Status: AC
  Administered 2017-11-09: 250 mg via INTRAMUSCULAR

## 2017-11-09 NOTE — Progress Notes (Signed)
+  GC in throat, given 250 mg Rocephin in office and RX azithromycin 500 mg # 2 2 p0 now

## 2017-11-09 NOTE — Progress Notes (Signed)
Pt here for Rocephin 250 mg due to + gonorrhea. Pt tolerated shot well. Return for POC at physical appt(already scheduled).  No sex until after POC. Pt voiced understanding. JSY

## 2017-11-10 LAB — GC/CHLAMYDIA PROBE AMP
Chlamydia trachomatis, NAA: NEGATIVE
Neisseria gonorrhoeae by PCR: NEGATIVE

## 2017-11-17 DIAGNOSIS — R509 Fever, unspecified: Secondary | ICD-10-CM | POA: Diagnosis not present

## 2017-11-17 DIAGNOSIS — R110 Nausea: Secondary | ICD-10-CM | POA: Diagnosis not present

## 2017-11-17 DIAGNOSIS — R61 Generalized hyperhidrosis: Secondary | ICD-10-CM | POA: Diagnosis not present

## 2017-11-30 ENCOUNTER — Other Ambulatory Visit: Payer: BLUE CROSS/BLUE SHIELD | Admitting: Adult Health

## 2017-12-04 ENCOUNTER — Other Ambulatory Visit: Payer: Self-pay

## 2017-12-04 ENCOUNTER — Encounter (HOSPITAL_COMMUNITY): Payer: Self-pay | Admitting: *Deleted

## 2017-12-04 ENCOUNTER — Ambulatory Visit (HOSPITAL_COMMUNITY)
Admission: EM | Admit: 2017-12-04 | Discharge: 2017-12-04 | Disposition: A | Discharge status: Home / Self Care | Payer: BLUE CROSS/BLUE SHIELD | Attending: Family Medicine | Admitting: Family Medicine

## 2017-12-04 DIAGNOSIS — T148XXA Other injury of unspecified body region, initial encounter: Secondary | ICD-10-CM

## 2017-12-04 NOTE — ED Triage Notes (Signed)
Started 2 days ago, per pt the inner part of her right thigh hurts

## 2017-12-04 NOTE — Discharge Instructions (Signed)
Pain is most likely a muscular strain. Pain may take 1-2 weeks to resolve.  Use anti-inflammatories for pain/swelling. You may take up to 800 mg Ibuprofen every 8 hours with food. You may supplement Ibuprofen with Tylenol [phone] mg every 8 hours.   Please alternate using ice/heating pad to help with any discomfort.   Please follow up here or with your primary care if symptoms not improving in 2 weeks. Please return sooner if pain changes, worsens, develop numbness, tingling.

## 2017-12-04 NOTE — ED Provider Notes (Signed)
[facility]    CSN: [id number] Arrival date & time: 12/04/17  [id number]     History   Chief Complaint No chief complaint on file.   HPI Lauren Standslexis J Emerick is a 22 y.o. female   no significant past medical history presenting today with right inner thigh soreness.  She states she feels like she pulled a muscle.  She denies any specific injury, increased activity or any exercise at all.  She states that she works and is on her feet a lot but does not have to do a lot of heavy lifting.  Her right inner thigh has a tight sensation and feels like she needs to stretch this out.  Pain began 2 days ago.  She has not tried anything for pain.  Denies any numbness or tingling, loss of bowel/bladder control.  Denies back pain.  Does states she has some mild right hip pain.  HPI  Past Medical History:  Diagnosis Date  . BV (bacterial vaginosis) 07/13/2016  . Chlamydia infection 07/13/2016  . Encounter for education about contraceptive use 12/17/2015  . Gonorrhea   . Migraines   . Nexplanon insertion 01/22/2016   Inserted left arm 01/22/16    Patient Active Problem List   Diagnosis Date Noted  . Chlamydia infection 07/13/2016  . BV (bacterial vaginosis) 07/13/2016  . Encounter for gynecological examination with Papanicolaou smear of cervix 07/07/2016  . Nexplanon in place 07/07/2016  . Nexplanon insertion 01/22/2016  . Encounter for education about contraceptive use 12/17/2015  . Sprain of ankle, unspecified site 02/13/2008    History reviewed. No pertinent surgical history.  OB History    Gravida Para Term Preterm AB Living   0 0 0 0 0 0   SAB TAB Ectopic Multiple Live Births   0 0 0 0         Home Medications    Prior to Admission medications   Medication Sig Start Date End Date Taking? Authorizing Provider  cyclobenzaprine (FLEXERIL) 10 MG tablet Take 0.5-1 tablets (5-10 mg total) by mouth at bedtime as needed for muscle spasms. 10/20/17   Belinda FisherYu, Amy V, PA-C  etonogestrel  (NEXPLANON) 68 MG IMPL implant 1 each by Subdermal route once.    [provider]  ibuprofen (ADVIL,MOTRIN) 600 MG tablet Take 1 tablet (600 mg total) by mouth every 6 (six) hours as needed for moderate pain. 11/07/17   Loren RacerYelverton, David, MD  megestrol (MEGACE) 40 MG tablet TAKE 1 TABLET BY MOUTH ONCE DAILY AS NEEDED FOR BLEEDING. 07/12/17   Adline PotterGriffin, Jennifer A, NP  metroNIDAZOLE (FLAGYL) 500 MG tablet Take 1 tablet (500 mg total) by mouth 3 (three) times daily. 11/08/17   Adline PotterGriffin, Jennifer A, NP    Family History Family History  Problem Relation Age of Onset  . Cancer Paternal Grandmother        breast  . Cancer Other     Social History Social History   Tobacco Use  . Smoking status: Former Smoker    Packs/day: 0.00    Types: Cigarettes  . Smokeless tobacco: Never Used  Substance Use Topics  . Alcohol use: Yes    Comment: occ  . Drug use: Yes    Types: Marijuana     Allergies   Cherry and Peanut-containing drug products   Review of Systems Review of Systems  Constitutional: Negative for fever.  Gastrointestinal: Negative for abdominal pain, nausea and vomiting.  Genitourinary: Negative for decreased urine volume, difficulty urinating and dysuria.  Musculoskeletal:  Positive for arthralgias, gait problem and myalgias.       R inner thigh pain  Skin: Negative for color change and rash.  Neurological: Negative for weakness and numbness.     Physical Exam Triage Vital Signs ED Triage Vitals  Enc Vitals Group     BP 12/04/17 1927 114/75     Pulse Rate 12/04/17 1927 77     Resp 12/04/17 1927 18     Temp 12/04/17 1927 98 F (36.7 C)     Temp Source 12/04/17 1927 Oral     SpO2 12/04/17 1927 100 %     Weight --      Height --      Head Circumference --      Peak Flow --      Pain Score 12/04/17 1934 7     Pain Loc --      Pain Edu? --      Excl. in GC? --    No data found.  Updated Vital Signs BP 114/75 (BP Location: Left Arm)   Pulse 77   Temp 98 F  (36.7 C) (Oral)   Resp 18   LMP 11/29/2017 (Exact Date)   SpO2 100%   Visual Acuity Right Eye Distance:   Left Eye Distance:   Bilateral Distance:    Right Eye Near:   Left Eye Near:    Bilateral Near:     Physical Exam  Constitutional: She appears well-developed and well-nourished. No distress.  HENT:  Head: Normocephalic and atraumatic.  Eyes: Conjunctivae are normal.  Neck: Neck supple.  Cardiovascular: Normal rate and regular rhythm.  No murmur heard. Pulmonary/Chest: Effort normal and breath sounds normal. No respiratory distress.  Abdominal: Soft. There is no tenderness.  Musculoskeletal: She exhibits no edema.  Right thigh: No obvious deformity, erythema or swelling.  Mild tenderness to palpation of medial aspect of thigh.  It extends toward groin.  Full range of motion at hip and knee.  Strength 5 out of 5 at right hip with flexion extension abduction abduction, compared to left.  Patellar reflexes 2+ bilaterally.  Neurological: She is alert.  Skin: Skin is warm and dry.  Psychiatric: She has a normal mood and affect.  Nursing note and vitals reviewed.    UC Treatments / Results  Labs (all labs ordered are listed, but only abnormal results are displayed) Labs Reviewed - No data to display  EKG  EKG Interpretation None       Radiology No results found.  Procedures Procedures (including critical care time)  Medications Ordered in UC Medications - No data to display   Initial Impression / Assessment and Plan / UC Course  I have reviewed the triage vital signs and the nursing notes.  Pertinent labs & imaging results that were available during my care of the patient were reviewed by me and considered in my medical decision making (see chart for details).     No significant mechanism of injury, range of motion full with minimal pain.  Will defer imaging.  We will treat as a muscular strain with anti-inflammatories, rest, ice.  Discussed foam  rolling/self myofascial release.  Discussed strict return precautions. Patient verbalized understanding and is agreeable with plan.   Final Clinical Impressions(s) / UC Diagnoses   Final diagnoses:  Pulled muscle    ED Discharge Orders    None       Controlled Substance Prescriptions [place] Controlled Substance Registry consulted? Not Applicable   Cruise Baumgardner, Sumner C, PA-C  12/04/17 2358  

## 2017-12-07 ENCOUNTER — Encounter: Payer: Self-pay | Admitting: Adult Health

## 2017-12-07 ENCOUNTER — Other Ambulatory Visit (HOSPITAL_COMMUNITY)
Admission: RE | Admit: 2017-12-07 | Discharge: 2017-12-07 | Disposition: A | Discharge status: Home / Self Care | Payer: BLUE CROSS/BLUE SHIELD | Source: Ambulatory Visit | Attending: Adult Health | Admitting: Adult Health

## 2017-12-07 ENCOUNTER — Ambulatory Visit: Payer: BLUE CROSS/BLUE SHIELD | Admitting: Adult Health

## 2017-12-07 ENCOUNTER — Encounter: Payer: Self-pay | Admitting: Obstetrics & Gynecology

## 2017-12-07 VITALS — BP 106/70 | HR 84 | Temp 99.8°F | Ht 63.0 in | Wt 149.0 lb

## 2017-12-07 DIAGNOSIS — Z8709 Personal history of other diseases of the respiratory system: Secondary | ICD-10-CM | POA: Insufficient documentation

## 2017-12-07 DIAGNOSIS — Z8619 Personal history of other infectious and parasitic diseases: Secondary | ICD-10-CM | POA: Diagnosis not present

## 2017-12-07 DIAGNOSIS — Z01419 Encounter for gynecological examination (general) (routine) without abnormal findings: Secondary | ICD-10-CM | POA: Diagnosis not present

## 2017-12-07 DIAGNOSIS — N921 Excessive and frequent menstruation with irregular cycle: Secondary | ICD-10-CM

## 2017-12-07 DIAGNOSIS — Z975 Presence of (intrauterine) contraceptive device: Secondary | ICD-10-CM | POA: Diagnosis not present

## 2017-12-07 DIAGNOSIS — Z01411 Encounter for gynecological examination (general) (routine) with abnormal findings: Secondary | ICD-10-CM | POA: Diagnosis not present

## 2017-12-07 MED ORDER — MEGESTROL ACETATE 40 MG PO TABS: ORAL_TABLET | ORAL | 1 refills | Status: DC

## 2017-12-07 NOTE — Progress Notes (Addendum)
Patient ID: Lauren Sherman, female   DOB: 01/25/1996, 22 y.o.   MRN: [id number] History of Present Illness: [name] is a 22 year old biracial female in for well woman gyn exam and pap. She was recently treated for strep A sore throat and +GC of throat. She is having some BTB with nexplanon.   Current Medications, Allergies, Past Medical History, Past Surgical History, Family History and Social History were reviewed in [facility] electronic medical record.     Review of Systems: Patient denies any headaches, hearing loss, fatigue, blurred vision, shortness of breath, chest pain, abdominal pain, problems with bowel movements, urination, or intercourse(not currently active). No joint pain or mood swings. Throat scratchy.  +BTB with nexplanon  Physical Exam:BP 106/70 (BP Location: Left Arm, Patient Position: Sitting, Cuff Size: Normal)   Pulse 84   Temp 99.8 F (37.7 C)   Ht 5\' 3"  (1.6 m)   Wt 149 lb (67.6 kg)   LMP 11/29/2017   BMI 26.39 kg/m  General:  Well developed, well nourished, no acute distress Skin:  Warm and dry,multiple tattoos  Neck:  Midline trachea, normal thyroid, good ROM, no lymphadenopathy,throat slightly red, no pustules, GC/CHL and strep culture obtained.  Lungs; Clear to auscultation bilaterally Breast:  No dominant palpable mass, retraction, or nipple discharge,has bilateral nipple rods Cardiovascular: Regular rate and rhythm Abdomen:  Soft, non tender, no hepatosplenomegaly Pelvic:  External genitalia is normal in appearance, no lesions.  The vagina is normal in appearance,+period like blood. Urethra has no lesions or masses. The cervix is smooth, pap with GC/CHl performed.  Uterus is felt to be normal size, shape, and contour.  No adnexal masses or tenderness noted.Bladder is non tender, no masses felt. Extremities/musculoskeletal:  No swelling or varicosities noted, no clubbing or cyanosis Psych:  No mood changes, alert and cooperative,seems happy PHQ 2 score  0.Will refill megace to take for BTB prn.  Impression: 1. Encounter for gynecological examination with Papanicolaou smear of cervix   2. History of gonorrhea   3. History of streptococcal sore throat   4. Nexplanon in place   5. Irregular intermenstrual bleeding       Plan: Pap with GC/CHL sent  GC/CHL sent from throat Strep culture sent Meds ordered this encounter  Medications  . megestrol (MEGACE) 40 MG tablet    Sig: TAKE 1 TABLET BY MOUTH ONCE DAILY AS NEEDED FOR BLEEDING.    Dispense:  30 tablet    Refill:  1    Order Specific Question:   Supervising Provider    Answer:   Lazaro ArmsEURE, LUTHER H [[id number]]  Physical in 1 year Pap in 3 if normal Use condoms if has sex

## 2017-12-08 ENCOUNTER — Ambulatory Visit (HOSPITAL_COMMUNITY)
Admission: EM | Admit: 2017-12-08 | Discharge: 2017-12-08 | Disposition: A | Discharge status: Home / Self Care | Payer: BLUE CROSS/BLUE SHIELD | Attending: Family Medicine | Admitting: Family Medicine

## 2017-12-08 ENCOUNTER — Encounter (HOSPITAL_COMMUNITY): Payer: Self-pay | Admitting: Emergency Medicine

## 2017-12-08 ENCOUNTER — Other Ambulatory Visit: Payer: Self-pay

## 2017-12-08 DIAGNOSIS — J111 Influenza due to unidentified influenza virus with other respiratory manifestations: Secondary | ICD-10-CM

## 2017-12-08 DIAGNOSIS — R69 Illness, unspecified: Secondary | ICD-10-CM | POA: Diagnosis not present

## 2017-12-08 MED ORDER — OSELTAMIVIR PHOSPHATE 75 MG PO CAPS: 75.0000 mg | ORAL_CAPSULE | Freq: Two times a day (BID) | ORAL | 0 refills | Status: AC

## 2017-12-08 MED ORDER — HYDROCODONE-HOMATROPINE 5-1.5 MG/5ML PO SYRP: 5.0000 mL | ORAL_SOLUTION | Freq: Four times a day (QID) | ORAL | 0 refills | Status: DC | PRN

## 2017-12-08 NOTE — ED Triage Notes (Signed)
Pt. Stated, I got sick yesterday with cough, sore throat , body hurts all over.

## 2017-12-08 NOTE — Discharge Instructions (Signed)

## 2017-12-09 LAB — GC/CHLAMYDIA PROBE AMP
CHLAMYDIA, DNA PROBE: NEGATIVE
Neisseria gonorrhoeae by PCR: NEGATIVE

## 2017-12-09 NOTE — ED Provider Notes (Signed)
[facility]   [id number] 12/08/17 Arrival Time: 1923  ASSESSMENT & PLAN:  1. Influenza-like illness     Meds ordered this encounter  Medications  . HYDROcodone-homatropine (HYCODAN) 5-1.5 MG/5ML syrup    Sig: Take 5 mLs by mouth every 6 (six) hours as needed for cough.    Dispense:  90 mL    Refill:  0  . oseltamivir (TAMIFLU) 75 MG capsule    Sig: Take 1 capsule (75 mg total) by mouth 2 (two) times daily for 5 days.    Dispense:  10 capsule    Refill:  0   Cough medication sedation precautions. Discussed typical duration of symptoms. OTC symptom care as needed. Ensure adequate fluid intake and rest. May f/u with PCP or here as needed.  Reviewed expectations re: course of current medical issues. Questions answered. Outlined signs and symptoms indicating need for more acute intervention. Patient verbalized understanding. After Visit Summary given.   SUBJECTIVE: History from: patient.  Lauren Sherman is a 22 y.o. female who presents with complaint of nasal congestion, post-nasal drainage, and a persistent dry cough. Onset abrupt, approximately 1 day ago. Overall fatigued with body aches. SOB: none. Wheezing: none. Fever: yes, subjective. Overall normal PO intake without n/v. Sick contacts: no. OTC treatment: cough med without relief.   Social History   Tobacco Use  Smoking Status Former Smoker  . Packs/day: 0.00  . Types: Cigarettes  Smokeless Tobacco Never Used    ROS: As per HPI.   OBJECTIVE:  Vitals:   12/08/17 2013 12/08/17 2015  BP: 113/83   Pulse: (!) 118   Resp: 17   Temp: 100.2 F (37.9 C)   TempSrc: Oral   SpO2: 99%   Weight:  151 lb (68.5 kg)  Height:  5\' 3"  (1.6 m)     General appearance: alert; appears fatigued HEENT: nasal congestion; clear runny nose; throat irritation secondary to post-nasal drainage Neck: supple without LAD Lungs: unlabored respirations, symmetrical air entry; cough: moderate; no respiratory distress Skin:  warm and dry Psychological: alert and cooperative; normal mood and affect  Allergies  Allergen Reactions  . Cherry Itching and Other (See Comments)    Burning   . Peanut-Containing Drug Products Itching    Past Medical History:  Diagnosis Date  . BV (bacterial vaginosis) 07/13/2016  . Chlamydia infection 07/13/2016  . Encounter for education about contraceptive use 12/17/2015  . Gonorrhea   . Migraines   . Nexplanon insertion 01/22/2016   Inserted left arm 01/22/16   Family History  Problem Relation Age of Onset  . Cancer Paternal Grandmother        breast  . Cancer Other    Social History   Socioeconomic History  . Marital status: Single    Spouse name: Not on file  . Number of children: Not on file  . Years of education: Not on file  . Highest education level: Not on file  Social Needs  . Financial resource strain: Not on file  . Food insecurity - worry: Not on file  . Food insecurity - inability: Not on file  . Transportation needs - medical: Not on file  . Transportation needs - non-medical: Not on file  Occupational History  . Not on file  Tobacco Use  . Smoking status: Former Smoker    Packs/day: 0.00    Types: Cigarettes  . Smokeless tobacco: Never Used  Substance and Sexual Activity  . Alcohol use: Yes    Comment: occ  .  Drug use: Yes    Types: Marijuana    Comment: 3 times a week  . Sexual activity: Not Currently    Partners: Male    Birth control/protection: Implant  Other Topics Concern  . Not on file  Social History Narrative  . Not on file           Mardella LaymanHagler, Selestino Nila, MD 12/09/17 [phone]

## 2017-12-10 LAB — CULTURE, GROUP A STREP

## 2017-12-13 LAB — CYTOLOGY - PAP
Chlamydia: NEGATIVE
Diagnosis: NEGATIVE
NEISSERIA GONORRHEA: NEGATIVE

## 2018-01-06 DIAGNOSIS — Z09 Encounter for follow-up examination after completed treatment for conditions other than malignant neoplasm: Secondary | ICD-10-CM | POA: Diagnosis not present

## 2018-02-28 ENCOUNTER — Ambulatory Visit (INDEPENDENT_AMBULATORY_CARE_PROVIDER_SITE_OTHER): Payer: BLUE CROSS/BLUE SHIELD | Admitting: Otolaryngology

## 2018-02-28 DIAGNOSIS — J3501 Chronic tonsillitis: Secondary | ICD-10-CM

## 2018-02-28 DIAGNOSIS — J351 Hypertrophy of tonsils: Secondary | ICD-10-CM | POA: Diagnosis not present

## 2018-03-08 ENCOUNTER — Other Ambulatory Visit: Payer: Self-pay | Admitting: Adult Health

## 2018-03-21 IMAGING — DX DG KNEE COMPLETE 4+V*L*
4 series · 4 of 4 positions shown · non-contrast
Comparison: None.

CLINICAL DATA: Left knee pain. Twisting motion and felt a pop.
Initial encounter.

EXAM:
LEFT KNEE - COMPLETE 4+ VIEW

[knee ap]
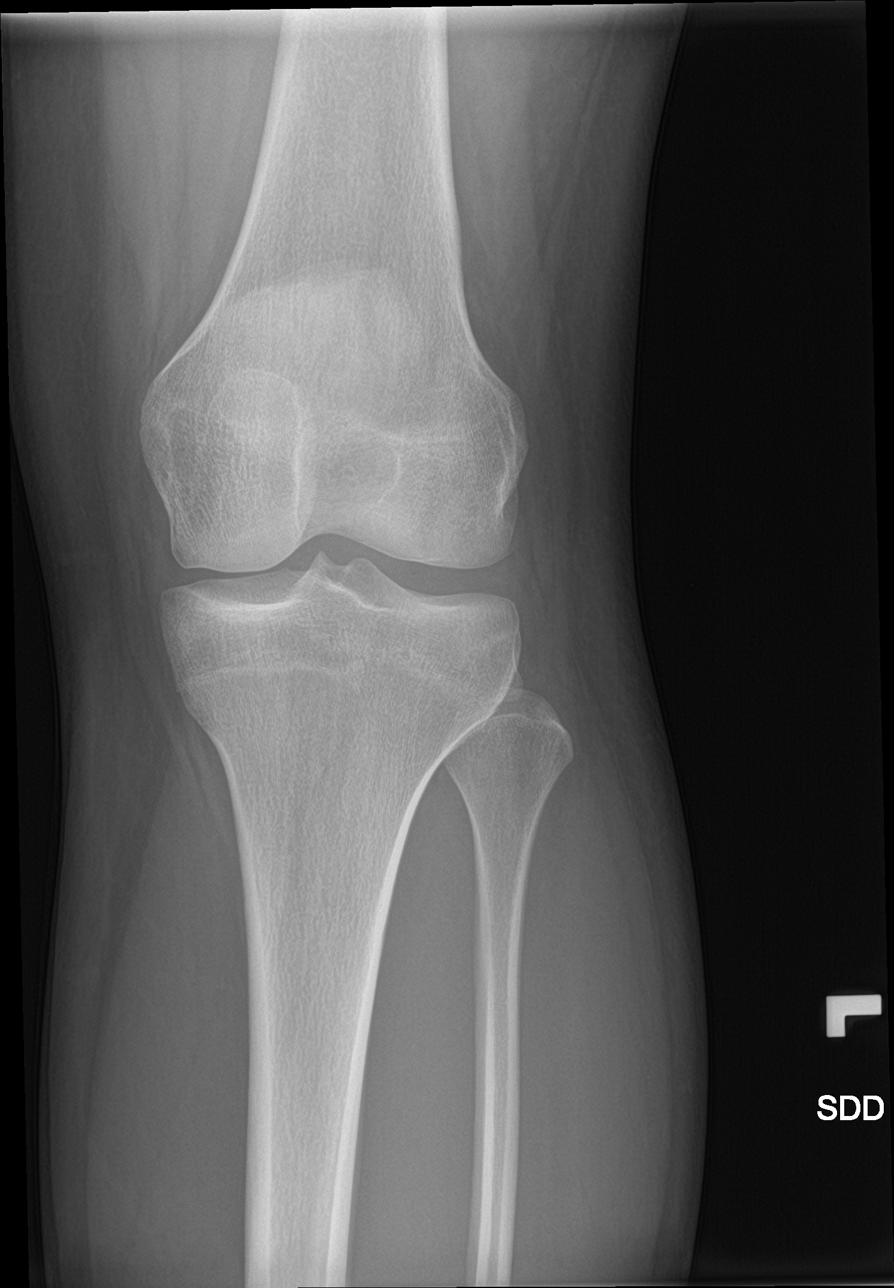

[tunnel]
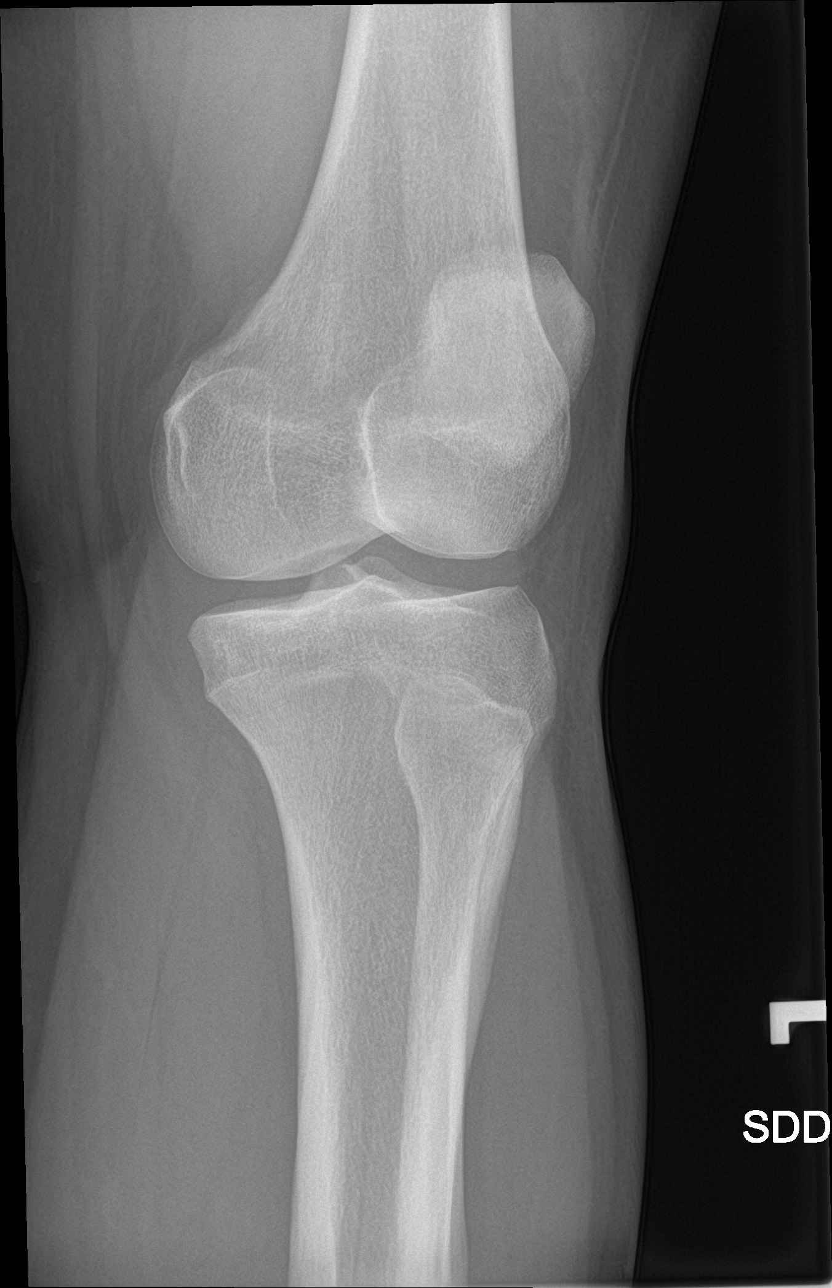

[knee lat]
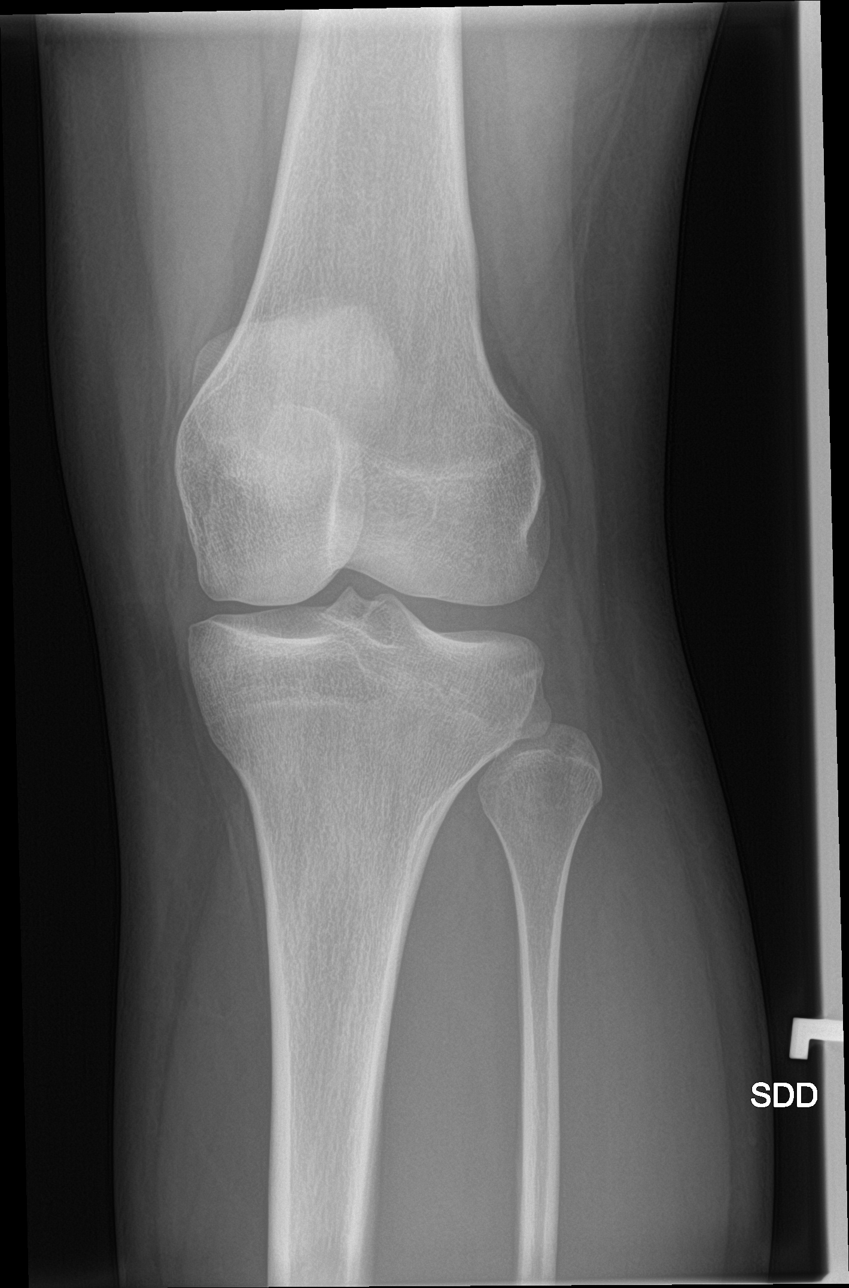

[knee sunrise]
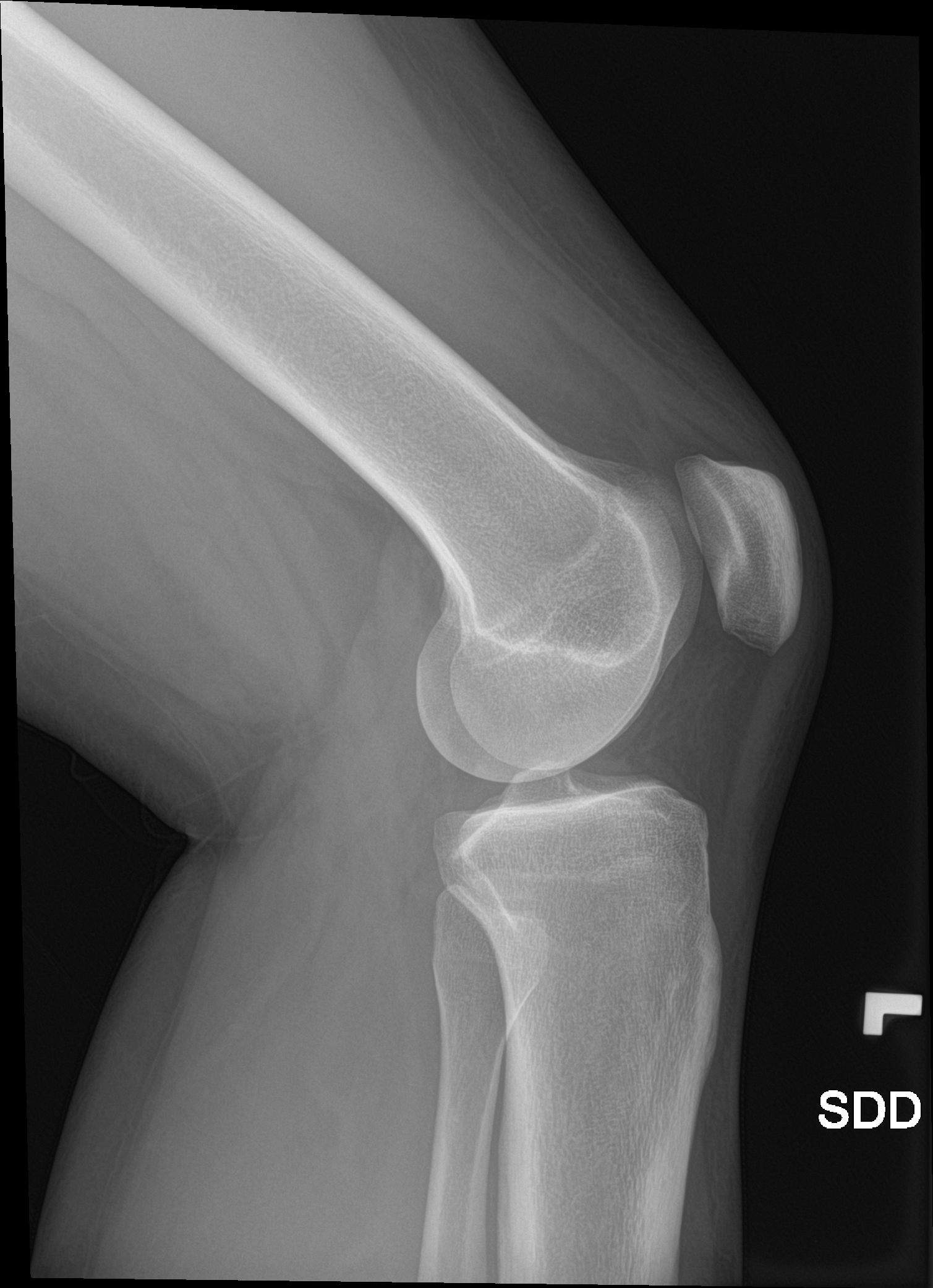

[4 of 4 positions shown; findings below may reference images not displayed]

FINDINGS: No evidence of fracture, dislocation, or joint effusion. No evidence
of arthropathy or other focal bone abnormality. Soft tissues are
unremarkable.
IMPRESSION: Negative.

## 2018-04-08 ENCOUNTER — Telehealth: Payer: Self-pay | Admitting: Adult Health

## 2018-04-08 NOTE — Telephone Encounter (Signed)
LMOVM that JAG is out of the office until Monday.  Informed patient directions say daily as needed and if she is having continued bleeding, may need to be seen.

## 2018-04-12 ENCOUNTER — Ambulatory Visit: Payer: BLUE CROSS/BLUE SHIELD | Admitting: Adult Health

## 2018-04-12 ENCOUNTER — Encounter: Payer: Self-pay | Admitting: Adult Health

## 2018-04-12 ENCOUNTER — Other Ambulatory Visit: Payer: Self-pay

## 2018-04-12 VITALS — BP 98/61 | HR 70 | Ht 63.0 in | Wt 160.0 lb

## 2018-04-12 DIAGNOSIS — Z113 Encounter for screening for infections with a predominantly sexual mode of transmission: Secondary | ICD-10-CM | POA: Diagnosis not present

## 2018-04-12 DIAGNOSIS — N921 Excessive and frequent menstruation with irregular cycle: Secondary | ICD-10-CM

## 2018-04-12 DIAGNOSIS — Z975 Presence of (intrauterine) contraceptive device: Secondary | ICD-10-CM | POA: Diagnosis not present

## 2018-04-12 MED ORDER — MEGESTROL ACETATE 40 MG PO TABS: ORAL_TABLET | ORAL | 3 refills | Status: DC

## 2018-04-12 NOTE — Progress Notes (Signed)
  Subjective:     Patient ID: Lauren Sherman, female   DOB: 08/08/1996, 22 y.o.   MRN: [id number]  HPI Lauren Sherman is a 22 year old female in complaining of irregular bleeding with nexplanon.   Review of Systems +irregular bleeding on nexplanon Reviewed past medical,surgical, social and family history. Reviewed medications and allergies.     Objective:   Physical Exam BP 98/61 (BP Location: Right Arm, Patient Position: Sitting, Cuff Size: Normal)   Pulse 70   Ht 5\' 3"  (1.6 m)   Wt 160 lb (72.6 kg)   LMP 04/09/2018   BMI 28.34 kg/m  Skin warm and dry.Pelvic: external genitalia is normal in appearance no lesions, vagina: pink discharge without odor,urethra has no lesions or masses noted, cervix:smooth,no CMT, uterus: normal size, shape and contour, non tender, no masses felt, adnexa: no masses or tenderness noted. Bladder is non tender and no masses felt.  Nuswab obtained.    Assessment:     1. Irregular intermenstrual bleeding   2. Nexplanon in place   3. Screening examination for STD (sexually transmitted disease)       Plan:    Nuswab sent  Meds ordered this encounter  Medications  . megestrol (MEGACE) 40 MG tablet    Sig: Take 1 daily for bleeding    Dispense:  30 tablet    Refill:  3    Order Specific Question:   Supervising Provider    Answer:   Duane LopeEURE, LUTHER H [[id number]]  F/U prn

## 2018-04-15 LAB — NUSWAB VAGINITIS PLUS (VG+)
Atopobium vaginae: HIGH Score — AB
BVAB 2: HIGH Score — AB
CANDIDA ALBICANS, NAA: NEGATIVE
CHLAMYDIA TRACHOMATIS, NAA: NEGATIVE
Candida glabrata, NAA: NEGATIVE
Megasphaera 1: HIGH Score — AB
NEISSERIA GONORRHOEAE, NAA: NEGATIVE
Trich vag by NAA: NEGATIVE

## 2018-04-19 ENCOUNTER — Telehealth: Payer: Self-pay | Admitting: Adult Health

## 2018-04-19 MED ORDER — METRONIDAZOLE 500 MG PO TABS: 500.0000 mg | ORAL_TABLET | Freq: Two times a day (BID) | ORAL | 0 refills | Status: DC

## 2018-04-19 NOTE — Telephone Encounter (Signed)
Pt aware of +BV and that Rx for flagyl sent

## 2018-06-23 ENCOUNTER — Other Ambulatory Visit: Payer: Self-pay | Admitting: Adult Health

## 2018-06-23 MED ORDER — MEGESTROL ACETATE 40 MG PO TABS: ORAL_TABLET | ORAL | 3 refills | Status: DC

## 2018-06-23 NOTE — Progress Notes (Signed)
Refill megace  

## 2018-06-27 ENCOUNTER — Telehealth: Payer: Self-pay | Admitting: Obstetrics & Gynecology

## 2018-06-27 ENCOUNTER — Encounter: Payer: Self-pay | Admitting: *Deleted

## 2018-06-27 NOTE — Telephone Encounter (Signed)
VO for Megace called to [facility].

## 2018-07-21 ENCOUNTER — Other Ambulatory Visit: Payer: Self-pay

## 2018-08-10 ENCOUNTER — Ambulatory Visit: Payer: BLUE CROSS/BLUE SHIELD | Admitting: Adult Health

## 2018-08-15 ENCOUNTER — Encounter: Payer: Self-pay | Admitting: Adult Health

## 2018-08-15 ENCOUNTER — Ambulatory Visit: Payer: BLUE CROSS/BLUE SHIELD | Admitting: Adult Health

## 2018-08-15 VITALS — BP 101/68 | HR 79 | Ht 63.0 in | Wt 150.0 lb

## 2018-08-15 DIAGNOSIS — R350 Frequency of micturition: Secondary | ICD-10-CM | POA: Diagnosis not present

## 2018-08-15 DIAGNOSIS — N898 Other specified noninflammatory disorders of vagina: Secondary | ICD-10-CM | POA: Diagnosis not present

## 2018-08-15 DIAGNOSIS — R309 Painful micturition, unspecified: Secondary | ICD-10-CM

## 2018-08-15 DIAGNOSIS — R319 Hematuria, unspecified: Secondary | ICD-10-CM

## 2018-08-15 DIAGNOSIS — Z113 Encounter for screening for infections with a predominantly sexual mode of transmission: Secondary | ICD-10-CM | POA: Diagnosis not present

## 2018-08-15 LAB — POCT URINALYSIS DIPSTICK OB
GLUCOSE, UA: NEGATIVE
Ketones, UA: NEGATIVE
LEUKOCYTES UA: NEGATIVE
Nitrite, UA: NEGATIVE
POC,PROTEIN,UA: NEGATIVE

## 2018-08-15 NOTE — Progress Notes (Addendum)
  Subjective:     Patient ID: Lauren Sherman, female   DOB: [birth year], 22 y.o.   MRN: [id number]  HPI Lauren Sherman is a 22 year old biracial female in complaining of vagina discharge with odor, urinary frequency and burning with urination for several days, has taken AZO and used OTC vaginal insert.   Review of Systems Vaginal discharge +vaginal odor +urainary frequency +burning with urination  No pain with sex Reviewed past medical,surgical, social and family history. Reviewed medications and allergies.     Objective:   Physical Exam BP 101/68 (BP Location: Left Arm, Patient Position: Sitting, Cuff Size: Normal)   Pulse 79   Ht 5\' 3"  (1.6 m)   Wt 150 lb (68 kg)   LMP 08/12/2018   BMI 26.57 kg/m   Urine dipstick trace blood. Skin warm and dry.Pelvic: external genitalia is normal in appearance no lesions, vagina: white discharge without odor,urethra has no lesions or masses noted, cervix:smooth, uterus: normal size, shape and contour, non tender, no masses felt, adnexa: no masses or tenderness noted. Bladder is non tender and no masses felt. Nuswab obtained.    Examination chaperoned by Malachy Mood LPN. Assessment:     1. Urinary frequency   2. Pain with urination   3. Vaginal discharge   4. Vaginal odor   5. Hematuria, unspecified type   6. Screening examination for STD (sexually transmitted disease)       Plan:     UA C&S sent Nuswab sent Push fluids

## 2018-08-16 ENCOUNTER — Telehealth: Payer: Self-pay | Admitting: *Deleted

## 2018-08-16 LAB — URINALYSIS, ROUTINE W REFLEX MICROSCOPIC
Bilirubin, UA: NEGATIVE
Glucose, UA: NEGATIVE
Ketones, UA: NEGATIVE
NITRITE UA: NEGATIVE
PH UA: 8 — AB (ref 5.0–7.5)
RBC, UA: NEGATIVE
Specific Gravity, UA: 1.025 (ref 1.005–1.030)
UUROB: 1 mg/dL (ref 0.2–1.0)

## 2018-08-16 LAB — MICROSCOPIC EXAMINATION
BACTERIA UA: NONE SEEN
Casts: NONE SEEN /lpf
WBC, UA: >30 /hpf — AB (ref 0–5)

## 2018-08-16 MED ORDER — SULFAMETHOXAZOLE-TRIMETHOPRIM 800-160 MG PO TABS: 1.0000 | ORAL_TABLET | Freq: Two times a day (BID) | ORAL | 0 refills | Status: DC

## 2018-08-16 MED ORDER — METRONIDAZOLE 500 MG PO TABS: 500.0000 mg | ORAL_TABLET | Freq: Two times a day (BID) | ORAL | 0 refills | Status: DC

## 2018-08-16 MED ORDER — PHENAZOPYRIDINE HCL 200 MG PO TABS: 200.0000 mg | ORAL_TABLET | Freq: Three times a day (TID) | ORAL | 0 refills | Status: DC | PRN

## 2018-08-16 NOTE — Telephone Encounter (Signed)
Left message @ 2:47 pm. JSY

## 2018-08-16 NOTE — Telephone Encounter (Signed)
Left message x 2. JSY 

## 2018-08-16 NOTE — Telephone Encounter (Signed)
Spoke with pt. Pt is having pain with urination. Feeling worse than yesterday. Pt has tried AZO with not much relief. Drinking lots of water. Blood in urine continues. Requests work note for today. Is there anything she can take for pain? Thanks!! JSY

## 2018-08-16 NOTE — Telephone Encounter (Signed)
Left message that nuswab shows +BV but all results not in and urine culture not back, but will rx flagyl, septra ds and pyridium

## 2018-08-17 ENCOUNTER — Encounter: Payer: Self-pay | Admitting: *Deleted

## 2018-08-17 LAB — NUSWAB VAGINITIS PLUS (VG+)
Candida albicans, NAA: NEGATIVE
Candida glabrata, NAA: NEGATIVE
Chlamydia trachomatis, NAA: NEGATIVE
MEGASPHAERA 1: HIGH {score} — AB
Neisseria gonorrhoeae, NAA: NEGATIVE
TRICH VAG BY NAA: NEGATIVE

## 2018-08-19 LAB — URINE CULTURE

## 2019-03-02 ENCOUNTER — Encounter: Payer: Self-pay | Admitting: *Deleted

## 2019-03-03 ENCOUNTER — Ambulatory Visit: Payer: BLUE CROSS/BLUE SHIELD | Admitting: Adult Health

## 2019-03-06 ENCOUNTER — Encounter: Payer: Self-pay | Admitting: *Deleted

## 2019-03-07 ENCOUNTER — Ambulatory Visit: Payer: BLUE CROSS/BLUE SHIELD | Admitting: Adult Health

## 2019-03-07 ENCOUNTER — Other Ambulatory Visit: Payer: Self-pay

## 2019-03-07 ENCOUNTER — Encounter: Payer: Self-pay | Admitting: Adult Health

## 2019-03-07 VITALS — BP 110/73 | HR 61 | Temp 98.3°F | Ht 63.0 in | Wt 165.0 lb

## 2019-03-07 DIAGNOSIS — Z975 Presence of (intrauterine) contraceptive device: Secondary | ICD-10-CM

## 2019-03-07 DIAGNOSIS — N921 Excessive and frequent menstruation with irregular cycle: Secondary | ICD-10-CM

## 2019-03-07 DIAGNOSIS — N898 Other specified noninflammatory disorders of vagina: Secondary | ICD-10-CM

## 2019-03-07 MED ORDER — MEGESTROL ACETATE 40 MG PO TABS: ORAL_TABLET | ORAL | 3 refills | Status: DC

## 2019-03-07 NOTE — Progress Notes (Signed)
Patient ID: Lauren Sherman, female   DOB: 02/15/1996, 23 y.o.   MRN: [id number] History of Present Illness:  Taci is a 23 year old biracila female, single, G0P0, in complaining of vaginal discharge, and odor at times and bleeding with nexplanon. PCP is [facility].   Current Medications, Allergies, Past Medical History, Past Surgical History, Family History and Social History were reviewed in [employer] record.     Review of Systems: +vaginal discharge +vaginal odor at times BTB with nexplanon    Physical Exam:BP 110/73 (BP Location: Left Arm, Patient Position: Sitting, Cuff Size: Normal)   Pulse 61   Temp 98.3 F (36.8 C)   Ht 5\' 3"  (1.6 m)   Wt 165 lb (74.8 kg)   LMP 03/06/2019   BMI 29.23 kg/m  General:  Well developed, well nourished, no acute distress Skin:  Warm and dry Pelvic:  External genitalia is normal in appearance, no lesions.  The vagina is normal in appearance,+blood, no odor today. Urethra has no lesions or masses. The cervix is smooth.  Uterus is felt to be normal size, shape, and contour.  No adnexal masses or tenderness noted.Bladder is non tender, no masses felt.Nuswab obtained. Psych:  No mood changes, alert and cooperative,seems happy Fall risk is low. Examination chaperoned by Federico Flake CMA.  Impression: 1. Vaginal discharge - NuSwab Vaginitis Plus (VG+)  2. Vaginal odor - NuSwab Vaginitis Plus (VG+)  3. Irregular intermenstrual bleeding - NuSwab Vaginitis Plus (VG+) Meds ordered this encounter  Medications  . megestrol (MEGACE) 40 MG tablet    Sig: Take 1 daily for bleeding    Dispense:  30 tablet    Refill:  3    Order Specific Question:   Supervising Provider    Answer:   Despina Hidden, LUTHER H [[id number]]    4. Nexplanon in place    Plan: Nuswab sent,will talk when results back Use condoms  Will refill megace to stop BTB F/U prn

## 2019-03-15 ENCOUNTER — Telehealth: Payer: Self-pay | Admitting: Adult Health

## 2019-03-15 NOTE — Telephone Encounter (Signed)
Patient called, checking on results.  [phone]

## 2019-03-16 ENCOUNTER — Other Ambulatory Visit: Payer: Self-pay | Admitting: Adult Health

## 2019-03-16 LAB — NUSWAB VAGINITIS PLUS (VG+)
Atopobium vaginae: HIGH Score — AB
BVAB 2: HIGH Score — AB
Candida albicans, NAA: NEGATIVE
Candida glabrata, NAA: NEGATIVE
Chlamydia trachomatis, NAA: NEGATIVE
Megasphaera 1: HIGH Score — AB
Neisseria gonorrhoeae, NAA: NEGATIVE
Trich vag by NAA: NEGATIVE

## 2019-03-16 MED ORDER — METRONIDAZOLE 500 MG PO TABS: 500.0000 mg | ORAL_TABLET | Freq: Two times a day (BID) | ORAL | 0 refills | Status: DC

## 2019-03-16 NOTE — Progress Notes (Signed)
rx flagyl for BV on nuswab  

## 2019-06-02 ENCOUNTER — Telehealth: Payer: BC Managed Care – PPO | Admitting: Family

## 2019-06-02 DIAGNOSIS — R531 Weakness: Secondary | ICD-10-CM

## 2019-06-02 DIAGNOSIS — R110 Nausea: Secondary | ICD-10-CM

## 2019-06-02 DIAGNOSIS — R42 Dizziness and giddiness: Secondary | ICD-10-CM

## 2019-06-02 NOTE — Progress Notes (Signed)
Based on what you shared with me, I feel your condition warrants further evaluation and I recommend that you be seen for a face to face office visit.  Based on your symptoms, I feel that it would be best for you to be seen face-to-face. I recommend following up with your PCP.  NOTE: If you entered your credit card information for this eVisit, you will not be charged. You may see a "hold" on your card for the $35 but that hold will drop off and you will not have a charge processed.  If you are having a true medical emergency please call 911.     For an urgent face to face visit, [facility] has five urgent care centers for your convenience:    [email or web address] to reserve your spot online an avoid wait times  [facility] (New Address!) [address], [place], [place] [address] *Just off [address], across the road from [place] hours of operation: Monday-Friday, 12 PM to 6 PM  Closed Saturday & Sunday   The following sites will take your insurance:  . [facility]    [phone]                  Get Driving Directions  [address], [place] [address] . 10 am to 8 pm Monday-Friday . 12 pm to 8 pm Saturday-Sunday   . [facility] at [place]                  Get Driving Directions  [address], [place], [place] [address] . 8 am to 8 pm Monday-Friday . 9 am to 6 pm Saturday . 11 am to 6 pm Sunday   . [facility] at [place]                  Get Driving Directions   [address], [place] [address] . 8 am to 8 pm Monday-Friday . 8 am to 4 pm Saturday-Sunday    . [facility] at [place]                    Get Driving Directions  [phone]  [address], [place],  [address]  . Monday-Friday, 12 PM to 6 PM    Your e-visit answers were  reviewed by a board certified advanced clinical practitioner to complete your personal care plan.  Thank you for using e-Visits.

## 2019-06-05 ENCOUNTER — Encounter: Payer: Self-pay | Admitting: Adult Health

## 2019-06-05 ENCOUNTER — Ambulatory Visit (INDEPENDENT_AMBULATORY_CARE_PROVIDER_SITE_OTHER): Payer: BC Managed Care – PPO | Admitting: Adult Health

## 2019-06-05 ENCOUNTER — Other Ambulatory Visit: Payer: Self-pay

## 2019-06-05 VITALS — BP 115/76 | HR 93 | Ht 63.0 in | Wt 168.0 lb

## 2019-06-05 DIAGNOSIS — R42 Dizziness and giddiness: Secondary | ICD-10-CM | POA: Insufficient documentation

## 2019-06-05 DIAGNOSIS — Z3046 Encounter for surveillance of implantable subdermal contraceptive: Secondary | ICD-10-CM | POA: Insufficient documentation

## 2019-06-05 MED ORDER — MECLIZINE HCL 25 MG PO TABS: 25.0000 mg | ORAL_TABLET | Freq: Three times a day (TID) | ORAL | 0 refills | Status: DC | PRN

## 2019-06-05 NOTE — Patient Instructions (Signed)
Use condoms, keep clean and dry x 24 hours, no heavy lifting, keep steri strips on x 72 hours, Keep pressure dressing on x 24 hours. Follow up prn problems.  

## 2019-06-05 NOTE — Progress Notes (Signed)
Patient ID: ANNALEIGHA WOO, female   DOB: 01-28-1996, 23 y.o.   MRN: [id number] History of Present Illness: Lauren Sherman is a 23 year old biracial female, single, G0P0 in for nexplanon removal. She said Friday she woke up dizzy, felt faint and had nausea and was worse with movement and had virtual visit.Saturday and Sunday she had a headache and just rested and pushed fluids and is better today. PCP is [facility].   Current Medications, Allergies, Past Medical History, Past Surgical History, Family History and Social History were reviewed in [employer] record.     Review of Systems: For nexplanon removal dizzy   Physical Exam:BP 115/76 (BP Location: Left Arm, Patient Position: Sitting, Cuff Size: Normal)   Pulse 93   Ht 5\' 3"  (1.6 m)   Wt 168 lb (76.2 kg)   BMI 29.76 kg/m  General:  Well developed, well nourished, no acute distress Skin:  Warm and dry Psych:  No mood changes, alert and cooperative,seems happy Fall risk is low. Consent signed, time out called. Left arm cleansed with betadine, and injected with 1.5 cc 2% lidocaine and waited til numb.Under sterile technique a #11 blade was used to make small vertical incision, and a curved forceps was used to easily remove rod. Steri strips applied. Pressure dressing applied. She declines birth control will use condoms for now.  Impression: 1. Encounter for Nexplanon removal   2. Dizzy spells       Plan: Use condoms, keep clean and dry x 24 hours, no heavy lifting, keep steri strips on x 72 hours, Keep pressure dressing on x 24 hours. Follow up prn problems. Discussed that sounds like she may have had vertigo, will Rx antivert Meds ordered this encounter  Medications  . meclizine (ANTIVERT) 25 MG tablet    Sig: Take 1 tablet (25 mg total) by mouth 3 (three) times daily as needed for dizziness.    Dispense:  30 tablet    Refill:  0    Order Specific Question:   Supervising Provider    Answer:   Tania Ade H  [[id number]]

## 2019-08-18 DIAGNOSIS — Z20828 Contact with and (suspected) exposure to other viral communicable diseases: Secondary | ICD-10-CM | POA: Diagnosis not present

## 2019-09-01 ENCOUNTER — Ambulatory Visit
Admission: EM | Admit: 2019-09-01 | Discharge: 2019-09-01 | Disposition: A | Discharge status: Home / Self Care | Payer: BC Managed Care – PPO | Attending: Emergency Medicine | Admitting: Emergency Medicine

## 2019-09-01 ENCOUNTER — Other Ambulatory Visit: Payer: Self-pay

## 2019-09-01 DIAGNOSIS — J329 Chronic sinusitis, unspecified: Secondary | ICD-10-CM

## 2019-09-01 DIAGNOSIS — B9789 Other viral agents as the cause of diseases classified elsewhere: Secondary | ICD-10-CM

## 2019-09-01 DIAGNOSIS — Z20828 Contact with and (suspected) exposure to other viral communicable diseases: Secondary | ICD-10-CM

## 2019-09-01 DIAGNOSIS — Z20822 Contact with and (suspected) exposure to covid-19: Secondary | ICD-10-CM

## 2019-09-01 NOTE — ED Provider Notes (Addendum)
[place]   [id number] 09/01/19 Arrival Time: 9211   CC: URI symptoms   SUBJECTIVE: History from: patient.  Lauren Sherman is a 23 y.o. female who presents with abrupt onset of RT sided nasal congestion, watery eyes, and scratchy throat x 2 days.  Denies sick exposure to COVID, flu or strep.  Denies recent travel.  Has tried OTC medications like nyquil, theraflu, vicks, and cough drops, with relief.  Symptoms are made worse first thing in the morning.  Reports previous symptoms in the past with sinus infection and allergies.  Complains of associated fever, with tmax of 103 last night.  Denies chills, fatigue, cough, SOB, wheezing, chest pain, nausea, vomiting, changes in bowel or bladder habits.    ROS: As per HPI.  All other pertinent ROS negative.     Past Medical History:  Diagnosis Date  . BV (bacterial vaginosis) 07/13/2016  . Chlamydia infection 07/13/2016  . Encounter for education about contraceptive use 12/17/2015  . Gonorrhea   . Migraines   . Nexplanon insertion 01/22/2016   Inserted left arm 01/22/16   History reviewed. No pertinent surgical history. Allergies  Allergen Reactions  . Cherry Itching and Other (See Comments)    Burning   . Peanut-Containing Drug Products Itching   No current facility-administered medications on file prior to encounter.    Current Outpatient Medications on File Prior to Encounter  Medication Sig Dispense Refill  . meclizine (ANTIVERT) 25 MG tablet Take 1 tablet (25 mg total) by mouth 3 (three) times daily as needed for dizziness. 30 tablet 0   Social History   Socioeconomic History  . Marital status: Single    Spouse name: Not on file  . Number of children: Not on file  . Years of education: Not on file  . Highest education level: Not on file  Occupational History  . Not on file  Social Needs  . Financial resource strain: Not on file  . Food insecurity    Worry: Not on file    Inability: Not on file  .  Transportation needs    Medical: Not on file    Non-medical: Not on file  Tobacco Use  . Smoking status: Former Smoker    Packs/day: 0.00    Types: Cigarettes  . Smokeless tobacco: Never Used  Substance and Sexual Activity  . Alcohol use: Yes    Comment: occ  . Drug use: Yes    Types: Marijuana    Comment: 1 time a week  . Sexual activity: Yes    Partners: Male    Birth control/protection: Condom  Lifestyle  . Physical activity    Days per week: Not on file    Minutes per session: Not on file  . Stress: Not on file  Relationships  . Social Herbalist on phone: Not on file    Gets together: Not on file    Attends religious service: Not on file    Active member of club or organization: Not on file    Attends meetings of clubs or organizations: Not on file    Relationship status: Not on file  . Intimate partner violence    Fear of current or ex partner: Not on file    Emotionally abused: Not on file    Physically abused: Not on file    Forced sexual activity: Not on file  Other Topics Concern  . Not on file  Social History Narrative  . Not on file  Family History  Problem Relation Age of Onset  . Cancer Paternal Grandmother        breast  . Cancer Other     OBJECTIVE:  Vitals:   09/01/19 1427  BP: 108/73  Pulse: 69  Resp: 18  Temp: 98.3 F (36.8 C)  SpO2: 98%     General appearance: alert; appears mildly fatigued, but nontoxic; speaking in full sentences and tolerating own secretions HEENT: NCAT; Ears: EACs clear, TMs pearly gray; Eyes: PERRL.  EOM grossly intact. Nose: nares patent with clear rhinorrhea, Throat: oropharynx injected, tonsils erythematous or enlarged, uvula midline  Neck: supple without LAD Lungs: unlabored respirations, symmetrical air entry; cough: absent; no respiratory distress; CTAB Heart: regular rate and rhythm.  Radial pulses 2+ symmetrical bilaterally, cap refill <2 seconds Skin: warm and dry; no tenting of dorsal aspect  of hand Psychological: alert and cooperative; normal mood and affect  ASSESSMENT & PLAN:  1. Suspected COVID-19 virus infection   2. Viral sinusitis     No orders of the defined types were placed in this encounter.   COVID testing ordered.  It will take between 5-7 days for test results.  Someone will contact you regarding abnormal results.    In the meantime: You should remain isolated in your home for 10 days from symptom onset AND greater than 72 hours after symptoms resolution (absence of fever without the use of fever-reducing medication and improvement in respiratory symptoms), whichever is longer Get plenty of rest and push fluids You may use OTC zyrtec for nasal congestion, runny nose, and/or sore throat You may use OTC flonase for nasal congestion and runny nose Use medications daily for symptom relief Use OTC medications like ibuprofen or tylenol as needed fever or pain Call or go to the ED if you have any new or worsening symptoms such as fever, worsening cough, shortness of breath, chest tightness, chest pain, turning blue, changes in mental status, etc...   Reviewed expectations re: course of current medical issues. Questions answered. Outlined signs and symptoms indicating need for more acute intervention. Patient verbalized understanding. After Visit Summary given.         Rennis Harding, PA-C 09/01/19 1612    Alvino Chapel [place], PA-C 09/01/19 [phone]

## 2019-09-01 NOTE — Discharge Instructions (Signed)
COVID testing ordered.  It will take between 5-7 days for test results.  Someone will contact you regarding abnormal results.   ° °In the meantime: °You should remain isolated in your home for 10 days from symptom onset AND greater than 72 hours after symptoms resolution (absence of fever without the use of fever-reducing medication and improvement in respiratory symptoms), whichever is longer °Get plenty of rest and push fluids °You may use OTC zyrtec for nasal congestion, runny nose, and/or sore throat °You may use OTC flonase for nasal congestion and runny nose °Use medications daily for symptom relief °Use OTC medications like ibuprofen or tylenol as needed fever or pain °Call or go to the ED if you have any new or worsening symptoms such as fever, worsening cough, shortness of breath, chest tightness, chest pain, turning blue, changes in mental status, etc...  °

## 2019-09-01 NOTE — ED Triage Notes (Signed)
Pt has had sinus symptoms for past 2 days, pt states she has right sided sinus

## 2019-09-05 LAB — NOVEL CORONAVIRUS, NAA: SARS-CoV-2, NAA: NOT DETECTED

## 2019-10-05 ENCOUNTER — Encounter: Payer: Self-pay | Admitting: *Deleted

## 2019-10-05 ENCOUNTER — Encounter: Payer: Self-pay | Admitting: Obstetrics & Gynecology

## 2019-10-05 ENCOUNTER — Other Ambulatory Visit: Payer: Self-pay

## 2019-10-05 ENCOUNTER — Ambulatory Visit (INDEPENDENT_AMBULATORY_CARE_PROVIDER_SITE_OTHER): Payer: BC Managed Care – PPO | Admitting: *Deleted

## 2019-10-05 VITALS — BP 129/63 | HR 63 | Ht 63.0 in | Wt 167.4 lb

## 2019-10-05 DIAGNOSIS — Z3201 Encounter for pregnancy test, result positive: Secondary | ICD-10-CM

## 2019-10-05 LAB — POCT URINE PREGNANCY: Preg Test, Ur: POSITIVE — AB

## 2019-10-05 NOTE — Progress Notes (Addendum)
   NURSE VISIT- PREGNANCY CONFIRMATION   SUBJECTIVE:  Lauren Sherman is a 23 y.o. G1P0000 female at [redacted]w[redacted]d by certain LMP of Patient's last menstrual period was 08/14/2019. Here for pregnancy confirmation.  Home pregnancy test: positive x 2  She reports nausea and vomiting.  She is not taking prenatal vitamins.    OBJECTIVE:  BP 129/63 (BP Location: Right Arm, Patient Position: Sitting, Cuff Size: Normal)   Pulse 63   Ht 5\' 3"  (1.6 m)   Wt 167 lb 6.4 oz (75.9 kg)   LMP 08/14/2019   BMI 29.65 kg/m   Appears well, in no apparent distress OB History  Gravida Para Term Preterm AB Living  1 0 0 0 0 0  SAB TAB Ectopic Multiple Live Births  0 0 0 0      # Outcome Date GA Lbr Len/2nd Weight Sex Delivery Anes PTL Lv  1 Current             Results for orders placed or performed in visit on 10/05/19 (from the past 24 hour(s))  POCT urine pregnancy   Collection Time: 10/05/19  2:03 PM  Result Value Ref Range   Preg Test, Ur Positive (A) Negative    ASSESSMENT: Positive pregnancy test, [redacted]w[redacted]d by LMP    PLAN: Schedule for dating ultrasound in 1 week Prenatal vitamins: note routed to [facility] to send prescription   Nausea medicines: requested-note routed to [facility] to send prescription   OB packet given: Yes  Rolena Infante  10/05/2019 2:07 PM   Chart reviewed for nurse visit. Agree with plan of care.  Christin Fudge, [place] 10/05/2019 8:26 PM

## 2019-10-06 ENCOUNTER — Other Ambulatory Visit: Payer: Self-pay | Admitting: Obstetrics and Gynecology

## 2019-10-06 DIAGNOSIS — O3680X0 Pregnancy with inconclusive fetal viability, not applicable or unspecified: Secondary | ICD-10-CM

## 2019-10-09 ENCOUNTER — Other Ambulatory Visit: Payer: Self-pay

## 2019-10-09 ENCOUNTER — Ambulatory Visit (INDEPENDENT_AMBULATORY_CARE_PROVIDER_SITE_OTHER): Payer: BC Managed Care – PPO

## 2019-10-09 DIAGNOSIS — Z3A08 8 weeks gestation of pregnancy: Secondary | ICD-10-CM | POA: Diagnosis not present

## 2019-10-09 DIAGNOSIS — O3680X0 Pregnancy with inconclusive fetal viability, not applicable or unspecified: Secondary | ICD-10-CM | POA: Diagnosis not present

## 2019-10-09 NOTE — Progress Notes (Signed)
[place] 7+1 wks,single IUP w/ys,positive fht 136 bpm,crl 11.02 mm,normal ovaries

## 2019-10-11 ENCOUNTER — Encounter: Payer: Self-pay | Admitting: *Deleted

## 2019-10-16 DIAGNOSIS — Z029 Encounter for administrative examinations, unspecified: Secondary | ICD-10-CM

## 2019-10-24 ENCOUNTER — Telehealth: Payer: Self-pay | Admitting: *Deleted

## 2019-10-24 ENCOUNTER — Encounter: Payer: Self-pay | Admitting: *Deleted

## 2019-10-24 NOTE — Telephone Encounter (Signed)
Patient left message on nurse line that she would like to make an appointment. She is having vaginal itching after a recent change in the brand of her toilet paper. She has not been sexually active in a while.

## 2019-10-24 NOTE — Telephone Encounter (Signed)
Patient states she is having vaginal itching, denies abnormal discharge. States her mom has a Diflucan tablet and offered it to her but she did not know if she could take it.  Advised not to take Diflucan in the first trimester but could get Monistat or generic OTC to treat.  If not better, to let us know. Pt verbalized understanding with no further questions.

## 2019-11-03 NOTE — L&D Delivery Note (Addendum)
[facility]N [facility] Delivery Note  Lauren Sherman is a 24 y.o. G1P1001 s/p NSVD at [redacted]w[redacted]d. She was admitted for SOL.   ROM: 2h 11m with clear fluid GBS Status: Negative/-- (07/01 1630) Maximum Maternal Temperature: 99.46F  Labor Progress: . Initial SVE: 4.5/80/-2. She then progressed to complete.   Delivery Date/Time: 05/30/2020 at 09:17 Delivery: Called to room and patient was complete and pushing. Head delivered ROA. No nuchal cord present. Shoulder and body delivered in usual fashion. Infant with spontaneous cry, placed on mother's abdomen, dried and stimulated. Cord clamped x 2 after 1-minute delay, and cut by Grandmother. Cord blood drawn. Placenta delivered spontaneously with gentle cord traction. Brisk bleeding from cervical os occurred with 400cc EBL, when cytotec given PR. Fundus firm with massage and Pitocin and PR cytotec. Labia, perineum, vagina, and cervix inspected inspected with 3C perineal laceration. Laceration was repaired in usual fashion with good hemostasis. Baby Weight:  4190g  Placenta: Sent to L&D Complications: None Lacerations: 3C perineal laceration, repaired with 2-0 Vicryl x2; 3-0 Vicryl x2. EBL: 472 mL Analgesia: Epidural   Infant:  APGAR (1 MIN): 9   APGAR (5 MINS): 9   APGAR (10 MINS):     Dr. Alysia Penna was called to the room to inspect the laceration and repair, he supervised the entirety of the repair.   Jen Mow, DO OB Family Medicine, [facility] for [facility], [facility] 05/30/2020, 4:51 PM

## 2019-11-10 ENCOUNTER — Other Ambulatory Visit: Payer: Self-pay | Admitting: Obstetrics & Gynecology

## 2019-11-10 DIAGNOSIS — Z3682 Encounter for antenatal screening for nuchal translucency: Secondary | ICD-10-CM

## 2019-11-13 ENCOUNTER — Ambulatory Visit (INDEPENDENT_AMBULATORY_CARE_PROVIDER_SITE_OTHER): Payer: BC Managed Care – PPO

## 2019-11-13 ENCOUNTER — Ambulatory Visit (INDEPENDENT_AMBULATORY_CARE_PROVIDER_SITE_OTHER): Payer: BC Managed Care – PPO | Admitting: Advanced Practice Midwife

## 2019-11-13 ENCOUNTER — Encounter: Payer: Self-pay | Admitting: Advanced Practice Midwife

## 2019-11-13 ENCOUNTER — Other Ambulatory Visit: Payer: Self-pay

## 2019-11-13 VITALS — BP 106/72 | HR 69 | Wt 167.0 lb

## 2019-11-13 DIAGNOSIS — Z31430 Encounter of female for testing for genetic disease carrier status for procreative management: Secondary | ICD-10-CM

## 2019-11-13 DIAGNOSIS — Z130 Encounter for screening for diseases of the blood and blood-forming organs and certain disorders involving the immune mechanism: Secondary | ICD-10-CM

## 2019-11-13 DIAGNOSIS — Z3A12 12 weeks gestation of pregnancy: Secondary | ICD-10-CM

## 2019-11-13 DIAGNOSIS — Z1389 Encounter for screening for other disorder: Secondary | ICD-10-CM

## 2019-11-13 DIAGNOSIS — Z3400 Encounter for supervision of normal first pregnancy, unspecified trimester: Secondary | ICD-10-CM | POA: Insufficient documentation

## 2019-11-13 DIAGNOSIS — Z3402 Encounter for supervision of normal first pregnancy, second trimester: Secondary | ICD-10-CM

## 2019-11-13 DIAGNOSIS — Z3401 Encounter for supervision of normal first pregnancy, first trimester: Secondary | ICD-10-CM | POA: Diagnosis not present

## 2019-11-13 DIAGNOSIS — Z331 Pregnant state, incidental: Secondary | ICD-10-CM | POA: Diagnosis not present

## 2019-11-13 DIAGNOSIS — Z113 Encounter for screening for infections with a predominantly sexual mode of transmission: Secondary | ICD-10-CM | POA: Diagnosis not present

## 2019-11-13 DIAGNOSIS — Z1371 Encounter for nonprocreative screening for genetic disease carrier status: Secondary | ICD-10-CM

## 2019-11-13 DIAGNOSIS — Z0283 Encounter for blood-alcohol and blood-drug test: Secondary | ICD-10-CM

## 2019-11-13 DIAGNOSIS — Z3682 Encounter for antenatal screening for nuchal translucency: Secondary | ICD-10-CM

## 2019-11-13 NOTE — Progress Notes (Signed)
INITIAL OBSTETRICAL VISIT Patient name: Lauren Sherman MRN [id number]  Date of birth: 13-Jul-1996 Chief Complaint:   Initial Prenatal Visit (nt/it, nausea)  History of Present Illness:   AAHNA ROSSA is a 24 y.o. G1P0000  female at [redacted]w[redacted]d by LMP with an Estimated Date of Delivery: 05/27/20 being seen today for her initial obstetrical visit.   Her obstetrical history is significant for first pregnancy. .   Today she reports fatigue.  Patient's last menstrual period was 08/21/2019 (exact date). Last pap 12/07/17. Results were: normal Review of Systems:   Pertinent items are noted in HPI Denies cramping/contractions, leakage of fluid, vaginal bleeding, abnormal vaginal discharge w/ itching/odor/irritation, headaches, visual changes, shortness of breath, chest pain, abdominal pain, severe nausea/vomiting, or problems with urination or bowel movements unless otherwise stated above.  Pertinent History Reviewed:  Reviewed past medical,surgical, social, obstetrical and family history.  Reviewed problem list, medications and allergies. OB History  Gravida Para Term Preterm AB Living  1 0 0 0 0 0  SAB TAB Ectopic Multiple Live Births  0 0 0 0      # Outcome Date GA Lbr Len/2nd Weight Sex Delivery Anes PTL Lv  1 Current            Physical Assessment:   Vitals:   11/13/19 1001  BP: 106/72  Pulse: 69  Weight: 167 lb (75.8 kg)  Body mass index is 29.58 kg/m.       Physical Examination:  General appearance - well appearing, and in no distress  Mental status - alert, oriented to person, place, and time  Psych:  She has a normal mood and affect  Skin - warm and dry, normal color, no suspicious lesions noted  Chest - effort normal, all lung fields clear to auscultation bilaterally  Heart - normal rate and regular rhythm  Abdomen - soft, nontender  Extremities:  No swelling or varicosities noted     via [place] 12+1 wks,measurements c/w dates,crl 55.15 mm,normal ovaries bilat,NT 1.2 mm,NB  present,fhr 158 bpm,fundal placenta   No results found for this or any previous visit (from the past 24 hour(s)).  Assessment & Plan:  1) Low-Risk Pregnancy G1P0000 at [redacted]w[redacted]d with an Estimated Date of Delivery: 05/27/20   2) Initial OB visit   Meds: No orders of the defined types were placed in this encounter.   Initial labs obtained Continue prenatal vitamins Reviewed n/v relief measures and warning s/s to report Reviewed recommended weight gain based on pre-gravid BMI Encouraged well-balanced diet Watched video for carrier screening/genetic testing:  Genetic Screening discussed First Screen and Integrated Screen: requested Cystic fibrosis screening requested SMA screening requested Fragile X screening requested Ultrasound discussed; fetal survey: requested CCNC completed  Follow-up: No follow-ups on file.   Orders Placed This Encounter  Procedures  . Urine Culture  . GC/Chlamydia Probe Amp  . Obstetric Panel, Including HIV  . Hemoglobinopathy evaluation  . Integrated 1  . Pain Management Screening Profile (10S)  . MaterniT 21 plus Core, Blood  . Inheritest Core(CF97,SMA,FraX)  . POC Urinalysis Dipstick OB    Jacklyn Shell DNP, CNM 11/13/2019 10:39 AM

## 2019-11-13 NOTE — Progress Notes (Signed)
[place] 12+1 wks,measurements c/w dates,crl 55.15 mm,normal ovaries bilat,NT 1.2 mm,NB present,fhr 158 bpm,fundal placenta

## 2019-11-13 NOTE — Patient Instructions (Signed)
 First Trimester of Pregnancy The first trimester of pregnancy is from week 1 until the end of week 12 (months 1 through 3). A week after a sperm fertilizes an egg, the egg will implant on the wall of the uterus. This embryo will begin to develop into a baby. Genes from you and your partner are forming the baby. The female genes determine whether the baby is a boy or a girl. At 6-8 weeks, the eyes and face are formed, and the heartbeat can be seen on ultrasound. At the end of 12 weeks, all the baby's organs are formed.  Now that you are pregnant, you will want to do everything you can to have a healthy baby. Two of the most important things are to get good prenatal care and to follow your health care provider's instructions. Prenatal care is all the medical care you receive before the baby's birth. This care will help prevent, find, and treat any problems during the pregnancy and childbirth. BODY CHANGES Your body goes through many changes during pregnancy. The changes vary from woman to woman.   You may gain or lose a couple of pounds at first.  You may feel sick to your stomach (nauseous) and throw up (vomit). If the vomiting is uncontrollable, call your health care provider.  You may tire easily.  You may develop headaches that can be relieved by medicines approved by your health care provider.  You may urinate more often. Painful urination may mean you have a bladder infection.  You may develop heartburn as a result of your pregnancy.  You may develop constipation because certain hormones are causing the muscles that push waste through your intestines to slow down.  You may develop hemorrhoids or swollen, bulging veins (varicose veins).  Your breasts may begin to grow larger and become tender. Your nipples may stick out more, and the tissue that surrounds them (areola) may become darker.  Your gums may bleed and may be sensitive to brushing and flossing.  Dark spots or blotches  (chloasma, mask of pregnancy) may develop on your face. This will likely fade after the baby is born.  Your menstrual periods will stop.  You may have a loss of appetite.  You may develop cravings for certain kinds of food.  You may have changes in your emotions from day to day, such as being excited to be pregnant or being concerned that something may go wrong with the pregnancy and baby.  You may have more vivid and strange dreams.  You may have changes in your hair. These can include thickening of your hair, rapid growth, and changes in texture. Some women also have hair loss during or after pregnancy, or hair that feels dry or thin. Your hair will most likely return to normal after your baby is born. WHAT TO EXPECT AT YOUR PRENATAL VISITS During a routine prenatal visit:  You will be weighed to make sure you and the baby are growing normally.  Your blood pressure will be taken.  Your abdomen will be measured to track your baby's growth.  The fetal heartbeat will be listened to starting around week 10 or 12 of your pregnancy.  Test results from any previous visits will be discussed. Your health care provider may ask you:  How you are feeling.  If you are feeling the baby move.  If you have had any abnormal symptoms, such as leaking fluid, bleeding, severe headaches, or abdominal cramping.  If you have any questions. Other   tests that may be performed during your first trimester include:  Blood tests to find your blood type and to check for the presence of any previous infections. They will also be used to check for low iron levels (anemia) and Rh antibodies. Later in the pregnancy, blood tests for diabetes will be done along with other tests if problems develop.  Urine tests to check for infections, diabetes, or protein in the urine.  An ultrasound to confirm the proper growth and development of the baby.  An amniocentesis to check for possible genetic problems.  Fetal  screens for spina bifida and Down syndrome.  You may need other tests to make sure you and the baby are doing well. HOME CARE INSTRUCTIONS  Medicines  Follow your health care provider's instructions regarding medicine use. Specific medicines may be either safe or unsafe to take during pregnancy.  Take your prenatal vitamins as directed.  If you develop constipation, try taking a stool softener if your health care provider approves. Diet  Eat regular, well-balanced meals. Choose a variety of foods, such as meat or vegetable-based protein, fish, milk and low-fat dairy products, vegetables, fruits, and whole grain breads and cereals. Your health care provider will help you determine the amount of weight gain that is right for you.  Avoid raw meat and uncooked cheese. These carry germs that can cause birth defects in the baby.  Eating four or five small meals rather than three large meals a day may help relieve nausea and vomiting. If you start to feel nauseous, eating a few soda crackers can be helpful. Drinking liquids between meals instead of during meals also seems to help nausea and vomiting.  If you develop constipation, eat more high-fiber foods, such as fresh vegetables or fruit and whole grains. Drink enough fluids to keep your urine clear or pale yellow. Activity and Exercise  Exercise only as directed by your health care provider. Exercising will help you:  Control your weight.  Stay in shape.  Be prepared for labor and delivery.  Experiencing pain or cramping in the lower abdomen or low back is a good sign that you should stop exercising. Check with your health care provider before continuing normal exercises.  Try to avoid standing for long periods of time. Move your legs often if you must stand in one place for a long time.  Avoid heavy lifting.  Wear low-heeled shoes, and practice good posture.  You may continue to have sex unless your health care provider directs you  otherwise. Relief of Pain or Discomfort  Wear a good support bra for breast tenderness.   Take warm sitz baths to soothe any pain or discomfort caused by hemorrhoids. Use hemorrhoid cream if your health care provider approves.   Rest with your legs elevated if you have leg cramps or low back pain.  If you develop varicose veins in your legs, wear support hose. Elevate your feet for 15 minutes, 3-4 times a day. Limit salt in your diet. Prenatal Care  Schedule your prenatal visits by the twelfth week of pregnancy. They are usually scheduled monthly at first, then more often in the last 2 months before delivery.  Write down your questions. Take them to your prenatal visits.  Keep all your prenatal visits as directed by your health care provider. Safety  Wear your seat belt at all times when driving.  Make a list of emergency phone numbers, including numbers for family, friends, the hospital, and police and fire departments. General   Tips  Ask your health care provider for a referral to a local prenatal education class. Begin classes no later than at the beginning of month 6 of your pregnancy.  Ask for help if you have counseling or nutritional needs during pregnancy. Your health care provider can offer advice or refer you to specialists for help with various needs.  Do not use hot tubs, steam rooms, or saunas.  Do not douche or use tampons or scented sanitary pads.  Do not cross your legs for long periods of time.  Avoid cat litter boxes and soil used by cats. These carry germs that can cause birth defects in the baby and possibly loss of the fetus by miscarriage or stillbirth.  Avoid all smoking, herbs, alcohol, and medicines not prescribed by your health care provider. Chemicals in these affect the formation and growth of the baby.  Schedule a dentist appointment. At home, brush your teeth with a soft toothbrush and be gentle when you floss. SEEK MEDICAL CARE IF:   You have  dizziness.  You have mild pelvic cramps, pelvic pressure, or nagging pain in the abdominal area.  You have persistent nausea, vomiting, or diarrhea.  You have a bad smelling vaginal discharge.  You have pain with urination.  You notice increased swelling in your face, hands, legs, or ankles. SEEK IMMEDIATE MEDICAL CARE IF:   You have a fever.  You are leaking fluid from your vagina.  You have spotting or bleeding from your vagina.  You have severe abdominal cramping or pain.  You have rapid weight gain or loss.  You vomit blood or material that looks like coffee grounds.  You are exposed to German measles and have never had them.  You are exposed to fifth disease or chickenpox.  You develop a severe headache.  You have shortness of breath.  You have any kind of trauma, such as from a fall or a car accident. Document Released: 10/13/2001 Document Revised: 03/05/2014 Document Reviewed: 08/29/2013 Exit[employer] Patient Information 2015 [employer]. This information is not intended to replace advice given to you by your health care provider. Make sure you discuss any questions you have with your health care provider.   Nausea & Vomiting  Have saltine crackers or pretzels by your bed and eat a few bites before you raise your head out of bed in the morning  Eat small frequent meals throughout the day instead of large meals  Drink plenty of fluids throughout the day to stay hydrated, just don't drink a lot of fluids with your meals.  This can make your stomach fill up faster making you feel sick  Do not brush your teeth right after you eat  Products with real ginger are good for nausea, like ginger ale and ginger hard candy Make sure it says made with real ginger!  Sucking on sour candy like lemon heads is also good for nausea  If your prenatal vitamins make you nauseated, take them at night so you will sleep through the nausea  Sea Bands  If you feel like you need  medicine for the nausea & vomiting please let us know  If you are unable to keep any fluids or food down please let us know   Constipation  Drink plenty of fluid, preferably water, throughout the day  Eat foods high in fiber such as fruits, vegetables, and grains  Exercise, such as walking, is a good way to keep your bowels regular  Drink warm fluids, especially warm   prune juice, or decaf coffee  Eat a 1/2 cup of real oatmeal (not instant), 1/2 cup applesauce, and 1/2-1 cup warm prune juice every day  If needed, you may take Colace (docusate sodium) stool softener once or twice a day to help keep the stool soft. If you are pregnant, wait until you are out of your first trimester (12-14 weeks of pregnancy)  If you still are having problems with constipation, you may take Miralax once daily as needed to help keep your bowels regular.  If you are pregnant, wait until you are out of your first trimester (12-14 weeks of pregnancy)  Safe Medications in Pregnancy   Acne: Benzoyl Peroxide Salicylic Acid  Backache/Headache: Tylenol: 2 regular strength every 4 hours OR              2 Extra strength every 6 hours  Colds/Coughs/Allergies: Benadryl (alcohol free) 25 mg every 6 hours as needed Breath right strips Claritin Cepacol throat lozenges Chloraseptic throat spray Cold-Eeze- up to three times per day Cough drops, alcohol free Flonase (by prescription only) Guaifenesin Mucinex Robitussin DM (plain only, alcohol free) Saline nasal spray/drops Sudafed (pseudoephedrine) & Actifed ** use only after [redacted] weeks gestation and if you do not have high blood pressure Tylenol Vicks Vaporub Zinc lozenges Zyrtec   Constipation: Colace Ducolax suppositories Fleet enema Glycerin suppositories Metamucil Milk of magnesia Miralax Senokot Smooth move tea  Diarrhea: Kaopectate Imodium A-D  *NO pepto Bismol  Hemorrhoids: Anusol Anusol HC Preparation  H Tucks  Indigestion: Tums Maalox Mylanta Zantac  Pepcid  Insomnia: Benadryl (alcohol free) 25mg every 6 hours as needed Tylenol PM Unisom, no Gelcaps  Leg Cramps: Tums MagGel  Nausea/Vomiting:  Bonine Dramamine Emetrol Ginger extract Sea bands Meclizine  Nausea medication to take during pregnancy:  Unisom (doxylamine succinate 25 mg tablets) Take one tablet daily at bedtime. If symptoms are not adequately controlled, the dose can be increased to a maximum recommended dose of two tablets daily (1/2 tablet in the morning, 1/2 tablet mid-afternoon and one at bedtime). Vitamin B6 100mg tablets. Take one tablet twice a day (up to 200 mg per day).  Skin Rashes: Aveeno products Benadryl cream or 25mg every 6 hours as needed Calamine Lotion 1% cortisone cream  Yeast infection: Gyne-lotrimin 7 Monistat 7   **If taking multiple medications, please check labels to avoid duplicating the same active ingredients **take medication as directed on the label ** Do not exceed 4000 mg of tylenol in 24 hours **Do not take medications that contain aspirin or ibuprofen      

## 2019-11-14 ENCOUNTER — Encounter: Payer: Self-pay | Admitting: Advanced Practice Midwife

## 2019-11-14 LAB — PMP SCREEN PROFILE (10S), URINE
Amphetamine Scrn, Ur: NEGATIVE ng/mL
BARBITURATE SCREEN URINE: NEGATIVE ng/mL
BENZODIAZEPINE SCREEN, URINE: NEGATIVE ng/mL
CANNABINOIDS UR QL SCN: POSITIVE ng/mL — AB
Cocaine (Metab) Scrn, Ur: NEGATIVE ng/mL
Creatinine(Crt), U: 150.3 mg/dL (ref 20.0–300.0)
Methadone Screen, Urine: NEGATIVE ng/mL
OXYCODONE+OXYMORPHONE UR QL SCN: NEGATIVE ng/mL
Opiate Scrn, Ur: NEGATIVE ng/mL
Ph of Urine: 6.4 (ref 4.5–8.9)
Phencyclidine Qn, Ur: NEGATIVE ng/mL
Propoxyphene Scrn, Ur: NEGATIVE ng/mL

## 2019-11-14 LAB — GC/CHLAMYDIA PROBE AMP
Chlamydia trachomatis, NAA: NEGATIVE
Neisseria Gonorrhoeae by PCR: NEGATIVE

## 2019-11-15 ENCOUNTER — Other Ambulatory Visit: Payer: Self-pay | Admitting: Advanced Practice Midwife

## 2019-11-15 MED ORDER — NITROFURANTOIN MONOHYD MACRO 100 MG PO CAPS: 100.0000 mg | ORAL_CAPSULE | Freq: Two times a day (BID) | ORAL | 0 refills | Status: DC

## 2019-11-16 LAB — URINE CULTURE

## 2019-11-16 LAB — OBSTETRIC PANEL, INCLUDING HIV
Antibody Screen: NEGATIVE
Basophils Absolute: 0 10*3/uL (ref 0.0–0.2)
Basos: 0 %
EOS (ABSOLUTE): 0 10*3/uL (ref 0.0–0.4)
Eos: 0 %
HIV Screen 4th Generation wRfx: NONREACTIVE
Hematocrit: 39.3 % (ref 34.0–46.6)
Hemoglobin: 13.3 g/dL (ref 11.1–15.9)
Hepatitis B Surface Ag: NEGATIVE
Immature Grans (Abs): 0 10*3/uL (ref 0.0–0.1)
Immature Granulocytes: 0 %
Lymphocytes Absolute: 1.5 10*3/uL (ref 0.7–3.1)
Lymphs: 20 %
MCH: 32.7 pg (ref 26.6–33.0)
MCHC: 33.8 g/dL (ref 31.5–35.7)
MCV: 97 fL (ref 79–97)
Monocytes Absolute: 0.5 10*3/uL (ref 0.1–0.9)
Monocytes: 6 %
Neutrophils Absolute: 5.4 10*3/uL (ref 1.4–7.0)
Neutrophils: 74 %
Platelets: 209 10*3/uL (ref 150–450)
RBC: 4.07 x10E6/uL (ref 3.77–5.28)
RDW: 11.9 % (ref 11.7–15.4)
RPR Ser Ql: NONREACTIVE
Rh Factor: POSITIVE
Rubella Antibodies, IGG: 3.52 index (ref >0.99)
WBC: 7.4 10*3/uL (ref 3.4–10.8)

## 2019-11-16 LAB — INTEGRATED 1
Crown Rump Length: 55.2 mm
Gest. Age on Collection Date: 12 weeks
Maternal Age at EDD: 25 yr
Nuchal Translucency (NT): 1.2 mm
Number of Fetuses: 1
PAPP-A Value: 2079.8 ng/mL
Weight: 167 [lb_av]

## 2019-11-16 LAB — HEMOGLOBINOPATHY EVALUATION
HGB C: 0 %
HGB S: 0 %
HGB VARIANT: 0 %
Hemoglobin A2 Quantitation: 2.6 % (ref 1.8–3.2)
Hemoglobin F Quantitation: 1.5 % (ref 0.0–2.0)
Hgb A: 95.9 % — ABNORMAL LOW (ref 96.4–98.8)

## 2019-11-23 ENCOUNTER — Telehealth: Payer: Self-pay | Admitting: *Deleted

## 2019-11-23 ENCOUNTER — Encounter: Payer: Self-pay | Admitting: *Deleted

## 2019-11-23 NOTE — Telephone Encounter (Signed)
Pt states that she hasn't received her BP cuff yet. Wanted to know the tracking information on that.

## 2019-11-28 LAB — MATERNIT 21 PLUS CORE, BLOOD
Fetal Fraction: 11
Result (T21): NEGATIVE
Trisomy 13 (Patau syndrome): NEGATIVE
Trisomy 18 (Edwards syndrome): NEGATIVE
Trisomy 21 (Down syndrome): NEGATIVE

## 2019-11-28 LAB — INHERITEST CORE(CF97,SMA,FRAX)

## 2019-11-30 ENCOUNTER — Telehealth: Payer: Self-pay | Admitting: *Deleted

## 2019-11-30 ENCOUNTER — Encounter: Payer: Self-pay | Admitting: *Deleted

## 2019-11-30 NOTE — Telephone Encounter (Signed)
Pt has virtual visit on Monday but has not received her bp cuff.

## 2019-12-04 ENCOUNTER — Encounter: Payer: Self-pay | Admitting: Advanced Practice Midwife

## 2019-12-04 ENCOUNTER — Telehealth (INDEPENDENT_AMBULATORY_CARE_PROVIDER_SITE_OTHER): Payer: BC Managed Care – PPO | Admitting: Advanced Practice Midwife

## 2019-12-04 VITALS — BP 111/72 | HR 75 | Ht 63.0 in

## 2019-12-04 DIAGNOSIS — O99322 Drug use complicating pregnancy, second trimester: Secondary | ICD-10-CM

## 2019-12-04 DIAGNOSIS — Z3402 Encounter for supervision of normal first pregnancy, second trimester: Secondary | ICD-10-CM

## 2019-12-04 DIAGNOSIS — F1290 Cannabis use, unspecified, uncomplicated: Secondary | ICD-10-CM

## 2019-12-04 DIAGNOSIS — O2341 Unspecified infection of urinary tract in pregnancy, first trimester: Secondary | ICD-10-CM

## 2019-12-04 DIAGNOSIS — Z3A15 15 weeks gestation of pregnancy: Secondary | ICD-10-CM

## 2019-12-04 DIAGNOSIS — O2340 Unspecified infection of urinary tract in pregnancy, unspecified trimester: Secondary | ICD-10-CM | POA: Insufficient documentation

## 2019-12-04 NOTE — Progress Notes (Signed)
   TELEHEALTH VIRTUAL OBSTETRICS VISIT ENCOUNTER NOTE Patient name: LORAE Sherman MRN [id number]  Date of birth: 11/28/1995  I connected with patient on 12/04/19 at  3:10 PM EST by MyChart and verified that I am speaking with the correct person using two identifiers. Due to COVID-19 recommendations, pt is not currently in our office.    I discussed the limitations, risks, security and privacy concerns of performing an evaluation and management service by telephone and the availability of in person appointments. I also discussed with the patient that there may be a patient responsible charge related to this service. The patient expressed understanding and agreed to proceed.  Chief Complaint:   Routine Prenatal Visit  History of Present Illness:   Lauren Sherman is a 24 y.o. G1P0000 female at [redacted]w[redacted]d with an Estimated Date of Delivery: 05/27/20 being evaluated today for ongoing management of a low-risk pregnancy.  Today she reports back pain; has been going on prior to preg; tips given. Contractions: Not present. Vag. Bleeding: None.  Movement: Present. denies leaking of fluid. Review of Systems:   Pertinent items are noted in HPI Denies abnormal vaginal discharge w/ itching/odor/irritation, headaches, visual changes, shortness of breath, chest pain, abdominal pain, severe nausea/vomiting, or problems with urination or bowel movements unless otherwise stated above. Pertinent History Reviewed:  Reviewed past medical,surgical, social, obstetrical and family history.  Reviewed problem list, medications and allergies. Physical Assessment:   Vitals:   12/04/19 1511 12/04/19 1512  BP:  111/72  Pulse:  75  Height: 5\' 3"  (1.6 m)   Body mass index is 29.58 kg/m.        Physical Examination:   General:  Alert, oriented and cooperative.   Mental Status: Normal mood and affect perceived. Normal judgment and thought content.  Rest of physical exam deferred due to type of encounter  No results found  for this or any previous visit (from the past 24 hour(s)).  Assessment & Plan:  1) Pregnancy G1P0000 at 100w0d with an Estimated Date of Delivery: 05/27/20   2) +THC, has quit smoking  3) UTI in first trimester, completed Macrobid; will get TOC at next visit   Meds: No orders of the defined types were placed in this encounter.   Labs/procedures today: none  Plan:  Continue routine obstetrical care.  Has home bp cuff.  Check bp weekly, let 05/29/20 know if >140/90.  Next visit: prefers will be in person for anatomy scan and 2nd IT    Reviewed: Preterm labor symptoms and general obstetric precautions including but not limited to vaginal bleeding, contractions, leaking of fluid and fetal movement were reviewed in detail with the patient. The patient was advised to call back or seek an in-person office evaluation/go to [facility] for any urgent or concerning symptoms. All questions were answered. Please refer to After Visit Summary for other counseling recommendations.    I provided 12 minutes of non-face-to-face time during this encounter.  Follow-up: No follow-ups on file.  Orders Placed This Encounter  Procedures  . [facility] OB Comp + 14 Wk   [place] [facility] 12/04/2019 3:37 PM

## 2019-12-11 ENCOUNTER — Telehealth: Payer: Self-pay | Admitting: *Deleted

## 2019-12-11 NOTE — Telephone Encounter (Signed)
Pt reports migraine and fever for 2 days. She wants a nurse to send her a mychart message or call her.

## 2020-01-01 ENCOUNTER — Encounter: Payer: BC Managed Care – PPO | Admitting: Women's Health

## 2020-01-01 ENCOUNTER — Other Ambulatory Visit: Payer: BC Managed Care – PPO

## 2020-01-16 ENCOUNTER — Ambulatory Visit (INDEPENDENT_AMBULATORY_CARE_PROVIDER_SITE_OTHER): Payer: BC Managed Care – PPO | Admitting: Obstetrics & Gynecology

## 2020-01-16 ENCOUNTER — Ambulatory Visit (INDEPENDENT_AMBULATORY_CARE_PROVIDER_SITE_OTHER): Payer: BC Managed Care – PPO

## 2020-01-16 ENCOUNTER — Other Ambulatory Visit: Payer: Self-pay

## 2020-01-16 ENCOUNTER — Encounter: Payer: Self-pay | Admitting: Obstetrics & Gynecology

## 2020-01-16 VITALS — BP 109/70 | HR 81 | Wt 190.0 lb

## 2020-01-16 DIAGNOSIS — F1290 Cannabis use, unspecified, uncomplicated: Secondary | ICD-10-CM

## 2020-01-16 DIAGNOSIS — Z3A21 21 weeks gestation of pregnancy: Secondary | ICD-10-CM

## 2020-01-16 DIAGNOSIS — Z3402 Encounter for supervision of normal first pregnancy, second trimester: Secondary | ICD-10-CM

## 2020-01-16 DIAGNOSIS — Z87440 Personal history of urinary (tract) infections: Secondary | ICD-10-CM | POA: Diagnosis not present

## 2020-01-16 DIAGNOSIS — Z1379 Encounter for other screening for genetic and chromosomal anomalies: Secondary | ICD-10-CM

## 2020-01-16 DIAGNOSIS — O321XX0 Maternal care for breech presentation, not applicable or unspecified: Secondary | ICD-10-CM | POA: Diagnosis not present

## 2020-01-16 DIAGNOSIS — O2342 Unspecified infection of urinary tract in pregnancy, second trimester: Secondary | ICD-10-CM

## 2020-01-16 LAB — POCT URINALYSIS DIPSTICK OB
Glucose, UA: NEGATIVE
Ketones, UA: NEGATIVE
Nitrite, UA: NEGATIVE
POC,PROTEIN,UA: NEGATIVE

## 2020-01-16 NOTE — Progress Notes (Signed)
   LOW-RISK PREGNANCY VISIT Patient name: Lauren Sherman MRN [id number]  Date of birth: September 18, 1996 Chief Complaint:   Routine Prenatal Visit ([place] today; 2nd IT; severe back pain)  History of Present Illness:   Lauren Sherman is a 24 y.o. G1P0000 female at [redacted]w[redacted]d with an Estimated Date of Delivery: 05/27/20 being seen today for ongoing management of a low-risk pregnancy.  Today she reports no complaints. Contractions: Not present. Vag. Bleeding: None.  Movement: Present. denies leaking of fluid. Review of Systems:   Pertinent items are noted in HPI Denies abnormal vaginal discharge w/ itching/odor/irritation, headaches, visual changes, shortness of breath, chest pain, abdominal pain, severe nausea/vomiting, or problems with urination or bowel movements unless otherwise stated above. Pertinent History Reviewed:  Reviewed past medical,surgical, social, obstetrical and family history.  Reviewed problem list, medications and allergies. Physical Assessment:   Vitals:   01/16/20 0928  BP: 109/70  Pulse: 81  Weight: 190 lb (86.2 kg)  Body mass index is 33.66 kg/m.        Physical Examination:   General appearance: Well appearing, and in no distress  Mental status: Alert, oriented to person, place, and time  Skin: Warm & dry  Cardiovascular: Normal heart rate noted  Respiratory: Normal respiratory effort, no distress  Abdomen: Soft, gravid, nontender  Pelvic: Cervical exam deferred         Extremities: Edema: Trace  Fetal Status: Fetal Heart Rate (bpm): 144 Fundal Height: 22 cm Movement: Present    Chaperone: n/a    Results for orders placed or performed in visit on 11/13/19 (from the past 24 hour(s))  POC Urinalysis Dipstick OB   Collection Time: 01/16/20  9:29 AM  Result Value Ref Range   Color, UA     Clarity, UA     Glucose, UA Negative Negative   Bilirubin, UA     Ketones, UA neg    Spec Grav, UA     Blood, UA trace    pH, UA     POC,PROTEIN,UA Negative Negative, Trace, Small  (1+), Moderate (2+), Large (3+), 4+   Urobilinogen, UA     Nitrite, UA neg    Leukocytes, UA Moderate (2+) (A) Negative   Appearance     Odor      Assessment & Plan:  1) Low-risk pregnancy G1P0000 at [redacted]w[redacted]d with an Estimated Date of Delivery: 05/27/20   2) Low back pain, lumbar hyperlordosis,    Meds: No orders of the defined types were placed in this encounter.  Labs/procedures today:   Plan:  Continue routine obstetrical care  Next visit: prefers in person    Reviewed: Preterm labor symptoms and general obstetric precautions including but not limited to vaginal bleeding, contractions, leaking of fluid and fetal movement were reviewed in detail with the patient.  All questions were answered. Has home bp cuff. Rx faxed to . Check bp weekly, let us know if >140/90.   Follow-up: Return in about 4 weeks (around 02/13/2020) for LROB.  Orders Placed This Encounter  Procedures  . Urine Culture  . [facility]   Lazaro Arms  01/16/2020 10:06 AM

## 2020-01-16 NOTE — Progress Notes (Signed)
[place] 21+1 wks,breech,posterior placenta gr 0,normal ovaries,fhr 144 bpm,svp 3.9 cm,efw 410 g 49%,anatomy complete no obvious abnormalities

## 2020-01-18 LAB — INTEGRATED 2
AFP MoM: 0.93
Alpha-Fetoprotein: 48.8 ng/mL
Crown Rump Length: 55.2 mm
DIA MoM: 1
DIA Value: 195.4 pg/mL
Estriol, Unconjugated: 3.25 ng/mL
Gest. Age on Collection Date: 12 weeks
Gestational Age: 21.1 weeks
Maternal Age at EDD: 25 yr
Nuchal Translucency (NT): 1.2 mm
Nuchal Translucency MoM: 0.97
Number of Fetuses: 1
PAPP-A MoM: 3.06
PAPP-A Value: 2079.8 ng/mL
Test Results:: NEGATIVE
Weight: 167 [lb_av]
Weight: 190 [lb_av]
hCG MoM: 1.82
hCG Value: 32.6 IU/mL
uE3 MoM: 1.2

## 2020-01-18 LAB — URINE CULTURE: Organism ID, Bacteria: NO GROWTH

## 2020-02-14 ENCOUNTER — Ambulatory Visit (INDEPENDENT_AMBULATORY_CARE_PROVIDER_SITE_OTHER): Payer: BC Managed Care – PPO | Admitting: Advanced Practice Midwife

## 2020-02-14 ENCOUNTER — Other Ambulatory Visit: Payer: Self-pay

## 2020-02-14 VITALS — BP 111/72 | HR 81 | Wt 201.0 lb

## 2020-02-14 DIAGNOSIS — Z3402 Encounter for supervision of normal first pregnancy, second trimester: Secondary | ICD-10-CM

## 2020-02-14 DIAGNOSIS — Z331 Pregnant state, incidental: Secondary | ICD-10-CM

## 2020-02-14 DIAGNOSIS — Z3A25 25 weeks gestation of pregnancy: Secondary | ICD-10-CM

## 2020-02-14 DIAGNOSIS — Z1389 Encounter for screening for other disorder: Secondary | ICD-10-CM

## 2020-02-14 DIAGNOSIS — O2341 Unspecified infection of urinary tract in pregnancy, first trimester: Secondary | ICD-10-CM

## 2020-02-14 LAB — POCT URINALYSIS DIPSTICK OB
Blood, UA: NEGATIVE
Glucose, UA: NEGATIVE
Ketones, UA: NEGATIVE
Leukocytes, UA: NEGATIVE
Nitrite, UA: NEGATIVE
POC,PROTEIN,UA: NEGATIVE

## 2020-02-14 NOTE — Progress Notes (Signed)
   LOW-RISK PREGNANCY VISIT Patient name: Lauren Sherman MRN [id number]  Date of birth: 01/24/1996 Chief Complaint:   Routine Prenatal Visit  History of Present Illness:   Lauren Sherman is a 24 y.o. G29P0000 female at [redacted]w[redacted]d with an Estimated Date of Delivery: 05/27/20 being seen today for ongoing management of a low-risk pregnancy.  Today she reports round lig discomfort when changing sides in bed; would like to exercise. Contractions: Not present. Vag. Bleeding: None.  Movement: Present. denies leaking of fluid. Review of Systems:   Pertinent items are noted in HPI Denies abnormal vaginal discharge w/ itching/odor/irritation, headaches, visual changes, shortness of breath, chest pain, abdominal pain, severe nausea/vomiting, or problems with urination or bowel movements unless otherwise stated above. Pertinent History Reviewed:  Reviewed past medical,surgical, social, obstetrical and family history.  Reviewed problem list, medications and allergies. Physical Assessment:   Vitals:   02/14/20 1411  BP: 111/72  Pulse: 81  Weight: 201 lb (91.2 kg)  Body mass index is 35.61 kg/m.        Physical Examination:   General appearance: Well appearing, and in no distress  Mental status: Alert, oriented to person, place, and time  Skin: Warm & dry  Cardiovascular: Normal heart rate noted  Respiratory: Normal respiratory effort, no distress  Abdomen: Soft, gravid, nontender  Pelvic: Cervical exam deferred         Extremities: Edema: None  Fetal Status: Fetal Heart Rate (bpm): 138 Fundal Height: 25 cm Movement: Present    Results for orders placed or performed in visit on 02/14/20 (from the past 24 hour(s))  POC Urinalysis Dipstick OB   Collection Time: 02/14/20  2:15 PM  Result Value Ref Range   Color, UA     Clarity, UA     Glucose, UA Negative Negative   Bilirubin, UA     Ketones, UA neg    Spec Grav, UA     Blood, UA neg    pH, UA     POC,PROTEIN,UA Negative Negative, Trace, Small  (1+), Moderate (2+), Large (3+), 4+   Urobilinogen, UA     Nitrite, UA neg    Leukocytes, UA Negative Negative   Appearance     Odor      Assessment & Plan:  1) Low-risk pregnancy G1P0000 at [redacted]w[redacted]d with an Estimated Date of Delivery: 05/27/20   2) Increase mat weight gain, discussed exercise in pregnancy, decreasing refined sugars, etc    Meds: No orders of the defined types were placed in this encounter.  Labs/procedures today: none  Plan:  Continue routine obstetrical care with PN2 at next visit  Reviewed: Preterm labor symptoms and general obstetric precautions including but not limited to vaginal bleeding, contractions, leaking of fluid and fetal movement were reviewed in detail with the patient.  All questions were answered. Has home bp cuff. Check bp weekly, let us know if >140/90.   Follow-up: Return in about 2 weeks (around 02/28/2020) for LROB, PN2, in person.  Orders Placed This Encounter  Procedures  . POC Urinalysis Dipstick OB   Arabella Merles [facility] 02/14/2020 2:42 PM

## 2020-02-14 NOTE — Patient Instructions (Signed)
Lauren Sherman, I greatly value your feedback.  If you receive a survey following your visit with [place] today, we appreciate you taking the time to fill it out.  Thanks, Lauren Sherman, CNM  [facility] ([address] [place], [place] [address]) Entrance C, located off of [address] valet parking  Go to [email or web address] to register for FREE online childbirth classes  [place] Pediatricians/Family Doctors:  [facility] [phone]            [facility] [phone]                 [facility] [phone] (usually not accepting new patients unless you have family there already, you are always welcome to call and ask)       [facility] Department [phone]       [facility] Pediatricians/Family Doctors:   [facility]: [phone]  [facility]: [phone]  [facility]: [phone]  [facility] Doctors:   [facility]: [phone]   [facility]: [phone]  [facility] Doctors:  [facility]: [phone]    Home Blood Pressure Monitoring for Patients   Your provider has recommended that you check your blood pressure (BP) at least once a week at home. If you do not have a blood pressure cuff at home, one will be provided for you. Contact your provider if you have not received your monitor within 1 week.   Helpful Tips for Accurate Home Blood Pressure Checks  . Don't smoke, exercise, or drink caffeine 30 minutes before checking your BP . Use the restroom before checking your BP (a full bladder can raise your pressure) . Relax in a comfortable upright chair . Feet on the ground . Left arm resting comfortably on a flat surface at the level of your heart . Legs uncrossed . Back supported . Sit quietly and don't talk . Place the cuff on your bare arm . Adjust snuggly, so that only two  fingertips can fit between your skin and the top of the cuff . Check 2 readings separated by at least one minute . Keep a log of your BP readings . For a visual, please reference this diagram: http://ccnc.care/bpdiagram  Provider Name: [facility]     Phone: [phone]  Zone 1: ALL CLEAR  Continue to monitor your symptoms:  . BP reading is less than 140 (top number) or less than 90 (bottom number)  . No right upper stomach pain . No headaches or seeing spots . No feeling nauseated or throwing up . No swelling in face and hands  Zone 2: CAUTION Call your doctor's office for any of the following:  . BP reading is greater than 140 (top number) or greater than 90 (bottom number)  . Stomach pain under your ribs in the middle or right side . Headaches or seeing spots . Feeling nauseated or throwing up . Swelling in face and hands  Zone 3: EMERGENCY  Seek immediate medical care if you have any of the following:  . BP reading is greater than160 (top number) or greater than 110 (bottom number) . Severe headaches not improving with Tylenol . Serious difficulty catching your breath . Any worsening symptoms from Zone 2     Second Trimester of Pregnancy The second trimester is from week 14 through week 27 (months 4 through 6). The second trimester is often a time when you feel your best.  Your body has adjusted to being pregnant, and you begin to feel better physically. Usually, morning sickness has lessened or quit completely, you may have more energy, and you may have an increase in appetite. The second trimester is also a time when the fetus is growing rapidly. At the end of the sixth month, the fetus is about 9 inches long and weighs about 1 pounds. You will likely begin to feel the baby move (quickening) between 16 and 20 weeks of pregnancy. Body changes during your second trimester Your body continues to go through many changes during your second trimester. The changes vary from  woman to woman.  Your weight will continue to increase. You will notice your lower abdomen bulging out.  You may begin to get stretch marks on your hips, abdomen, and breasts.  You may develop headaches that can be relieved by medicines. The medicines should be approved by your health care provider.  You may urinate more often because the fetus is pressing on your bladder.  You may develop or continue to have heartburn as a result of your pregnancy.  You may develop constipation because certain hormones are causing the muscles that push waste through your intestines to slow down.  You may develop hemorrhoids or swollen, bulging veins (varicose veins).  You may have back pain. This is caused by: ? Weight gain. ? Pregnancy hormones that are relaxing the joints in your pelvis. ? A shift in weight and the muscles that support your balance.  Your breasts will continue to grow and they will continue to become tender.  Your gums may bleed and may be sensitive to brushing and flossing.  Dark spots or blotches (chloasma, mask of pregnancy) may develop on your face. This will likely fade after the baby is born.  A dark line from your belly button to the pubic area (linea nigra) may appear. This will likely fade after the baby is born.  You may have changes in your hair. These can include thickening of your hair, rapid growth, and changes in texture. Some women also have hair loss during or after pregnancy, or hair that feels dry or thin. Your hair will most likely return to normal after your baby is born.  What to expect at prenatal visits During a routine prenatal visit:  You will be weighed to make sure you and the fetus are growing normally.  Your blood pressure will be taken.  Your abdomen will be measured to track your baby's growth.  The fetal heartbeat will be listened to.  Any test results from the previous visit will be discussed.  Your health care provider may ask  you:  How you are feeling.  If you are feeling the baby move.  If you have had any abnormal symptoms, such as leaking fluid, bleeding, severe headaches, or abdominal cramping.  If you are using any tobacco products, including cigarettes, chewing tobacco, and electronic cigarettes.  If you have any questions.  Other tests that may be performed during your second trimester include:  Blood tests that check for: ? Low iron levels (anemia). ? High blood sugar that affects pregnant women (gestational diabetes) between 38 and 28 weeks. ? Rh antibodies. This is to check for a protein on red blood cells (Rh factor).  Urine tests to check for infections, diabetes, or protein in the urine.  An ultrasound to confirm the proper growth and development of the baby.  An amniocentesis to check for possible genetic problems.  Fetal screens  for spina bifida and Down syndrome.  HIV (human immunodeficiency virus) testing. Routine prenatal testing includes screening for HIV, unless you choose not to have this test.  Follow these instructions at home: Medicines  Follow your health care provider's instructions regarding medicine use. Specific medicines may be either safe or unsafe to take during pregnancy.  Take a prenatal vitamin that contains at least 600 micrograms (mcg) of folic acid.  If you develop constipation, try taking a stool softener if your health care provider approves. Eating and drinking  Eat a balanced diet that includes fresh fruits and vegetables, whole grains, good sources of protein such as meat, eggs, or tofu, and low-fat dairy. Your health care provider will help you determine the amount of weight gain that is right for you.  Avoid raw meat and uncooked cheese. These carry germs that can cause birth defects in the baby.  If you have low calcium intake from food, talk to your health care provider about whether you should take a daily calcium supplement.  Limit foods that  are high in fat and processed sugars, such as fried and sweet foods.  To prevent constipation: ? Drink enough fluid to keep your urine clear or pale yellow. ? Eat foods that are high in fiber, such as fresh fruits and vegetables, whole grains, and beans. Activity  Exercise only as directed by your health care provider. Most women can continue their usual exercise routine during pregnancy. Try to exercise for 30 minutes at least 5 days a week. Stop exercising if you experience uterine contractions.  Avoid heavy lifting, wear low heel shoes, and practice good posture.  A sexual relationship may be continued unless your health care provider directs you otherwise. Relieving pain and discomfort  Wear a good support bra to prevent discomfort from breast tenderness.  Take warm sitz baths to soothe any pain or discomfort caused by hemorrhoids. Use hemorrhoid cream if your health care provider approves.  Rest with your legs elevated if you have leg cramps or low back pain.  If you develop varicose veins, wear support hose. Elevate your feet for 15 minutes, 3-4 times a day. Limit salt in your diet. Prenatal Care  Write down your questions. Take them to your prenatal visits.  Keep all your prenatal visits as told by your health care provider. This is important. Safety  Wear your seat belt at all times when driving.  Make a list of emergency phone numbers, including numbers for family, friends, the hospital, and police and fire departments. General instructions  Ask your health care provider for a referral to a local prenatal education class. Begin classes no later than the beginning of month 6 of your pregnancy.  Ask for help if you have counseling or nutritional needs during pregnancy. Your health care provider can offer advice or refer you to specialists for help with various needs.  Do not use hot tubs, steam rooms, or saunas.  Do not douche or use tampons or scented sanitary  pads.  Do not cross your legs for long periods of time.  Avoid cat litter boxes and soil used by cats. These carry germs that can cause birth defects in the baby and possibly loss of the fetus by miscarriage or stillbirth.  Avoid all smoking, herbs, alcohol, and unprescribed drugs. Chemicals in these products can affect the formation and growth of the baby.  Do not use any products that contain nicotine or tobacco, such as cigarettes and e-cigarettes. If you need help quitting,  ask your health care provider.  Visit your dentist if you have not gone yet during your pregnancy. Use a soft toothbrush to brush your teeth and be gentle when you floss. Contact a health care provider if:  You have dizziness.  You have mild pelvic cramps, pelvic pressure, or nagging pain in the abdominal area.  You have persistent nausea, vomiting, or diarrhea.  You have a bad smelling vaginal discharge.  You have pain when you urinate. Get help right away if:  You have a fever.  You are leaking fluid from your vagina.  You have spotting or bleeding from your vagina.  You have severe abdominal cramping or pain.  You have rapid weight gain or weight loss.  You have shortness of breath with chest pain.  You notice sudden or extreme swelling of your face, hands, ankles, feet, or legs.  You have not felt your baby move in over an hour.  You have severe headaches that do not go away when you take medicine.  You have vision changes. Summary  The second trimester is from week 14 through week 27 (months 4 through 6). It is also a time when the fetus is growing rapidly.  Your body goes through many changes during pregnancy. The changes vary from woman to woman.  Avoid all smoking, herbs, alcohol, and unprescribed drugs. These chemicals affect the formation and growth your baby.  Do not use any tobacco products, such as cigarettes, chewing tobacco, and e-cigarettes. If you need help quitting, ask your  health care provider.  Contact your health care provider if you have any questions. Keep all prenatal visits as told by your health care provider. This is important. This information is not intended to replace advice given to you by your health care provider. Make sure you discuss any questions you have with your health care provider. Document Released: 10/13/2001 Document Revised: 03/26/2016 Document Reviewed: 12/20/2012 Elsevier Interactive Patient Education  2017 [place] FLU! Because you are pregnant, we at [facility], along with the Centers for Disease Control (CDC), recommend that you receive the flu vaccine to protect yourself and your baby from the flu. The flu is more likely to cause severe illness in pregnant women than in women of reproductive age who are not pregnant. Changes in the immune system, heart, and lungs during pregnancy make pregnant women (and women up to two weeks postpartum) more prone to severe illness from flu, including illness resulting in hospitalization. Flu also may be harmful for a pregnant woman's developing baby. A common flu symptom is fever, which may be associated with neural tube defects and other adverse outcomes for a developing baby. Getting vaccinated can also help protect a baby after birth from flu. (Mom passes antibodies onto the developing baby during her pregnancy.)  A Flu Vaccine is the Best Protection Against Flu Getting a flu vaccine is the first and most important step in protecting against flu. Pregnant women should get a flu shot and not the live attenuated influenza vaccine (LAIV), also known as nasal spray flu vaccine. Flu vaccines given during pregnancy help protect both the mother and her baby from flu. Vaccination has been shown to reduce the risk of flu-associated acute respiratory infection in pregnant women by up to one-half. A 2018 study showed that getting a flu shot reduced a pregnant woman's  risk of being hospitalized with flu by an average of 40 percent. Pregnant women who get a flu  vaccine are also helping to protect their babies from flu illness for the first several months after their birth, when they are too young to get vaccinated.   A Long Record of Safety for Flu Shots in Pregnant Women Flu shots have been given to millions of pregnant women over many years with a good safety record. There is a lot of evidence that flu vaccines can be given safely during pregnancy; though these data are limited for the first trimester. The CDC recommends that pregnant women get vaccinated during any trimester of their pregnancy. It is very important for pregnant women to get the flu shot.   Other Preventive Actions In addition to getting a flu shot, pregnant women should take the same everyday preventive actions the CDC recommends of everyone, including covering coughs, washing hands often, and avoiding people who are sick.  Symptoms and Treatment If you get sick with flu symptoms call your doctor right away. There are antiviral drugs that can treat flu illness and prevent serious flu complications. The CDC recommends prompt treatment for people who have influenza infection or suspected influenza infection and who are at high risk of serious flu complications, such as people with asthma, diabetes (including gestational diabetes), or heart disease. Early treatment of influenza in hospitalized pregnant women has been shown to reduce the length of the hospital stay.  Symptoms Flu symptoms include fever, cough, sore throat, runny or stuffy nose, body aches, headache, chills and fatigue. Some people may also have vomiting and diarrhea. People may be infected with the flu and have respiratory symptoms without a fever.  Early Treatment is Important for Pregnant Women Treatment should begin as soon as possible because antiviral drugs work best when started early (within 48 hours after symptoms  start). Antiviral drugs can make your flu illness milder and make you feel better faster. They may also prevent serious health problems that can result from flu illness. Oral oseltamivir (Tamiflu) is the preferred treatment for pregnant women because it has the most studies available to suggest that it is safe and beneficial. Antiviral drugs require a prescription from your provider. Having a fever caused by flu infection or other infections early in pregnancy may be linked to birth defects in a baby. In addition to taking antiviral drugs, pregnant women who get a fever should treat their fever with Tylenol (acetaminophen) and contact their provider immediately.  When to [place] If you are pregnant and have any of these signs, seek care immediately:  Difficulty breathing or shortness of breath  Pain or pressure in the chest or abdomen  Sudden dizziness  Confusion  Severe or persistent vomiting  High fever that is not responding to Tylenol (or store brand equivalent)  Decreased or no movement of your baby  [email or web address]

## 2020-02-28 ENCOUNTER — Other Ambulatory Visit: Payer: Self-pay

## 2020-02-28 ENCOUNTER — Encounter: Payer: Self-pay | Admitting: Advanced Practice Midwife

## 2020-02-28 ENCOUNTER — Other Ambulatory Visit: Payer: BC Managed Care – PPO

## 2020-02-28 ENCOUNTER — Ambulatory Visit (INDEPENDENT_AMBULATORY_CARE_PROVIDER_SITE_OTHER): Payer: BC Managed Care – PPO | Admitting: Advanced Practice Midwife

## 2020-02-28 VITALS — BP 122/73 | HR 92 | Wt 208.0 lb

## 2020-02-28 DIAGNOSIS — Z23 Encounter for immunization: Secondary | ICD-10-CM

## 2020-02-28 DIAGNOSIS — Z1389 Encounter for screening for other disorder: Secondary | ICD-10-CM

## 2020-02-28 DIAGNOSIS — Z3402 Encounter for supervision of normal first pregnancy, second trimester: Secondary | ICD-10-CM

## 2020-02-28 DIAGNOSIS — Z3A27 27 weeks gestation of pregnancy: Secondary | ICD-10-CM

## 2020-02-28 DIAGNOSIS — Z331 Pregnant state, incidental: Secondary | ICD-10-CM

## 2020-02-28 LAB — POCT URINALYSIS DIPSTICK OB
Blood, UA: NEGATIVE
Glucose, UA: NEGATIVE
Ketones, UA: NEGATIVE
Leukocytes, UA: NEGATIVE
Nitrite, UA: NEGATIVE
POC,PROTEIN,UA: NEGATIVE

## 2020-02-28 NOTE — Progress Notes (Signed)
   LOW-RISK PREGNANCY VISIT Patient name: Lauren Sherman MRN [id number]  Date of birth: 1996/06/17 Chief Complaint:   Routine Prenatal Visit (PN2 today)  History of Present Illness:   Lauren Sherman is a 24 y.o. G1P0000 female at [redacted]w[redacted]d with an Estimated Date of Delivery: 05/27/20 being seen today for ongoing management of a low-risk pregnancy.  Today she reports had some R side pain during the night, self-resolved; has been walking/stretching 3x/week. Contractions: Not present. Vag. Bleeding: None.  Movement: Present. denies leaking of fluid. Review of Systems:   Pertinent items are noted in HPI Denies abnormal vaginal discharge w/ itching/odor/irritation, headaches, visual changes, shortness of breath, chest pain, abdominal pain, severe nausea/vomiting, or problems with urination or bowel movements unless otherwise stated above. Pertinent History Reviewed:  Reviewed past medical,surgical, social, obstetrical and family history.  Reviewed problem list, medications and allergies. Physical Assessment:   Vitals:   02/28/20 0951  BP: 122/73  Pulse: 92  Weight: 208 lb (94.3 kg)  Body mass index is 36.85 kg/m.        Physical Examination:   General appearance: Well appearing, and in no distress  Mental status: Alert, oriented to person, place, and time  Skin: Warm & dry  Cardiovascular: Normal heart rate noted  Respiratory: Normal respiratory effort, no distress  Abdomen: Soft, gravid, nontender  Pelvic: Cervical exam deferred         Extremities: Edema: Trace  Fetal Status: Fetal Heart Rate (bpm): 136 Fundal Height: 27 cm Movement: Present    Results for orders placed or performed in visit on 02/28/20 (from the past 24 hour(s))  POC Urinalysis Dipstick OB   Collection Time: 02/28/20  9:52 AM  Result Value Ref Range   Color, UA     Clarity, UA     Glucose, UA Negative Negative   Bilirubin, UA     Ketones, UA neg    Spec Grav, UA     Blood, UA neg    pH, UA     POC,PROTEIN,UA  Negative Negative, Trace, Small (1+), Moderate (2+), Large (3+), 4+   Urobilinogen, UA     Nitrite, UA neg    Leukocytes, UA Negative Negative   Appearance     Odor      Assessment & Plan:  1) Low-risk pregnancy G1P0000 at [redacted]w[redacted]d with an Estimated Date of Delivery: 05/27/20   2) Increased mat weight gain, 50lb; is exercising 3-4x/week  3) Previous THC use, has quit; f/u UDS today   Meds: No orders of the defined types were placed in this encounter.  Labs/procedures today: Tdap, PN2, UDS  Plan:  Continue routine obstetrical care   Reviewed: Preterm labor symptoms and general obstetric precautions including but not limited to vaginal bleeding, contractions, leaking of fluid and fetal movement were reviewed in detail with the patient.  All questions were answered. Has home bp cuff. Check bp weekly, let us know if >140/90.   Follow-up: Return in about 3 weeks (around 03/20/2020) for LROB, in person.  Orders Placed This Encounter  Procedures  . Tdap vaccine greater than or equal to 7yo IM  . Pain Management Screening Profile (10S)  . POC Urinalysis Dipstick OB   Arabella Merles [facility] 02/28/2020 10:18 AM

## 2020-02-29 LAB — CBC
Hematocrit: 34.3 % (ref 34.0–46.6)
Hemoglobin: 12 g/dL (ref 11.1–15.9)
MCH: 33.3 pg — ABNORMAL HIGH (ref 26.6–33.0)
MCHC: 35 g/dL (ref 31.5–35.7)
MCV: 95 fL (ref 79–97)
Platelets: 195 10*3/uL (ref 150–450)
RBC: 3.6 x10E6/uL — ABNORMAL LOW (ref 3.77–5.28)
RDW: 11.2 % — ABNORMAL LOW (ref 11.7–15.4)
WBC: 8.3 10*3/uL (ref 3.4–10.8)

## 2020-02-29 LAB — GLUCOSE TOLERANCE, 2 HOURS W/ 1HR
Glucose, 1 hour: 110 mg/dL (ref 65–179)
Glucose, 2 hour: 88 mg/dL (ref 65–152)
Glucose, Fasting: 69 mg/dL (ref 65–91)

## 2020-02-29 LAB — ANTIBODY SCREEN: Antibody Screen: NEGATIVE

## 2020-02-29 LAB — HIV ANTIBODY (ROUTINE TESTING W REFLEX): HIV Screen 4th Generation wRfx: NONREACTIVE

## 2020-02-29 LAB — RPR: RPR Ser Ql: NONREACTIVE

## 2020-03-01 LAB — PMP SCREEN PROFILE (10S), URINE
Amphetamine Scrn, Ur: NEGATIVE ng/mL
BARBITURATE SCREEN URINE: NEGATIVE ng/mL
BENZODIAZEPINE SCREEN, URINE: NEGATIVE ng/mL
CANNABINOIDS UR QL SCN: NEGATIVE ng/mL
Cocaine (Metab) Scrn, Ur: NEGATIVE ng/mL
Creatinine(Crt), U: 62.6 mg/dL (ref 20.0–300.0)
Methadone Screen, Urine: NEGATIVE ng/mL
OXYCODONE+OXYMORPHONE UR QL SCN: NEGATIVE ng/mL
Opiate Scrn, Ur: NEGATIVE ng/mL
Ph of Urine: 8 (ref 4.5–8.9)
Phencyclidine Qn, Ur: NEGATIVE ng/mL
Propoxyphene Scrn, Ur: NEGATIVE ng/mL

## 2020-03-19 ENCOUNTER — Telehealth: Payer: Self-pay | Admitting: Adult Health

## 2020-03-19 NOTE — Telephone Encounter (Signed)

## 2020-03-20 ENCOUNTER — Ambulatory Visit (INDEPENDENT_AMBULATORY_CARE_PROVIDER_SITE_OTHER): Payer: BC Managed Care – PPO | Admitting: Advanced Practice Midwife

## 2020-03-20 ENCOUNTER — Encounter: Payer: Self-pay | Admitting: Advanced Practice Midwife

## 2020-03-20 ENCOUNTER — Other Ambulatory Visit: Payer: Self-pay

## 2020-03-20 VITALS — BP 122/77 | HR 89 | Wt 215.0 lb

## 2020-03-20 DIAGNOSIS — Z1389 Encounter for screening for other disorder: Secondary | ICD-10-CM

## 2020-03-20 DIAGNOSIS — Z331 Pregnant state, incidental: Secondary | ICD-10-CM

## 2020-03-20 DIAGNOSIS — Z3A30 30 weeks gestation of pregnancy: Secondary | ICD-10-CM

## 2020-03-20 DIAGNOSIS — Z3403 Encounter for supervision of normal first pregnancy, third trimester: Secondary | ICD-10-CM

## 2020-03-20 LAB — POCT URINALYSIS DIPSTICK OB
Blood, UA: NEGATIVE
Glucose, UA: NEGATIVE
Ketones, UA: NEGATIVE
Leukocytes, UA: NEGATIVE
Nitrite, UA: NEGATIVE

## 2020-03-20 NOTE — Patient Instructions (Signed)
Lauren Sherman, I greatly value your feedback.  If you receive a survey following your visit with [place] today, we appreciate you taking the time to fill it out.  Thanks, Lauren Sherman, CNM   [facility] ([address] [place], [place] [address]) Entrance C, located off of [address] valet parking  Go to [email or web address] to register for FREE online childbirth classes   Call the office [phone]) or go to [facility] if:  You begin to have strong, frequent contractions  Your water breaks.  Sometimes it is a big gush of fluid, sometimes it is just a trickle that keeps getting your panties wet or running down your legs  You have vaginal bleeding.  It is normal to have a small amount of spotting if your cervix was checked.   You don't feel your baby moving like normal.  If you don't, get you something to eat and drink and lay down and focus on feeling your baby move.  You should feel at least 10 movements in 2 hours.  If you don't, you should call the office or go to [facility].    Tdap Vaccine  It is recommended that you get the Tdap vaccine during the third trimester of EACH pregnancy to help protect your baby from getting pertussis (whooping cough)  27-36 weeks is the BEST time to do this so that you can pass the protection on to your baby. During pregnancy is better than after pregnancy, but if you are unable to get it during pregnancy it will be offered at the hospital.   You can get this vaccine with [place], at the health department, your family doctor, or some local pharmacies  Everyone who will be around your baby should also be up-to-date on their vaccines before the baby comes. Adults (who are not pregnant) only need 1 dose of Tdap during adulthood.   [place] Pediatricians/Family Doctors:  [facility] [phone]            [facility] [phone]                 [facility] [phone]  (usually not accepting new patients unless you have family there already, you are always welcome to call and ask)       [facility] [phone]       [facility] Pediatricians/Family Doctors:   [facility]: [phone]  [facility]: [phone]  [facility]: [phone]  [facility] Doctors:   [facility]: [phone]   [facility]: [phone]  [facility] Doctors:  [facility]: [phone]   Home Blood Pressure Monitoring for Patients   Your provider has recommended that you check your blood pressure (BP) at least once a week at home. If you do not have a blood pressure cuff at home, one will be provided for you. Contact your provider if you have not received your monitor within 1 week.   Helpful Tips for Accurate Home Blood Pressure Checks  . Don't smoke, exercise, or drink caffeine 30 minutes before checking your BP . Use the restroom before checking your BP (a full bladder can raise your pressure) . Relax in a comfortable upright chair . Feet on the ground . Left arm resting comfortably on a flat surface at the level of your heart . Legs uncrossed . Back supported . Sit quietly and don't talk . Place the cuff on your bare  arm . Adjust snuggly, so that only two fingertips can fit between your skin and the top of the cuff . Check 2 readings separated by at least one minute . Keep a log of your BP readings . For a visual, please reference this diagram: http://ccnc.care/bpdiagram  Provider Name: [facility]     Phone: [phone]  Zone 1: ALL CLEAR  Continue to monitor your symptoms:  . BP reading is less than 140 (top number) or less than 90 (bottom number)  . No right upper stomach pain . No headaches or seeing spots . No feeling nauseated or throwing up . No swelling in face and hands  Zone 2: CAUTION Call your doctor's office for  any of the following:  . BP reading is greater than 140 (top number) or greater than 90 (bottom number)  . Stomach pain under your ribs in the middle or right side . Headaches or seeing spots . Feeling nauseated or throwing up . Swelling in face and hands  Zone 3: EMERGENCY  Seek immediate medical care if you have any of the following:  . BP reading is greater than160 (top number) or greater than 110 (bottom number) . Severe headaches not improving with Tylenol . Serious difficulty catching your breath . Any worsening symptoms from Zone 2   Third Trimester of Pregnancy The third trimester is from week 29 through week 42, months 7 through 9. The third trimester is a time when the fetus is growing rapidly. At the end of the ninth month, the fetus is about 20 inches in length and weighs 6-10 pounds.  BODY CHANGES Your body goes through many changes during pregnancy. The changes vary from woman to woman.   Your weight will continue to increase. You can expect to gain 25-35 pounds (11-16 kg) by the end of the pregnancy.  You may begin to get stretch marks on your hips, abdomen, and breasts.  You may urinate more often because the fetus is moving lower into your pelvis and pressing on your bladder.  You may develop or continue to have heartburn as a result of your pregnancy.  You may develop constipation because certain hormones are causing the muscles that push waste through your intestines to slow down.  You may develop hemorrhoids or swollen, bulging veins (varicose veins).  You may have pelvic pain because of the weight gain and pregnancy hormones relaxing your joints between the bones in your pelvis. Backaches may result from overexertion of the muscles supporting your posture.  You may have changes in your hair. These can include thickening of your hair, rapid growth, and changes in texture. Some women also have hair loss during or after pregnancy, or hair that feels dry or thin.  Your hair will most likely return to normal after your baby is born.  Your breasts will continue to grow and be tender. A yellow discharge may leak from your breasts called colostrum.  Your belly button may stick out.  You may feel short of breath because of your expanding uterus.  You may notice the fetus "dropping," or moving lower in your abdomen.  You may have a bloody mucus discharge. This usually occurs a few days to a week before labor begins.  Your cervix becomes thin and soft (effaced) near your due date. WHAT TO EXPECT AT YOUR PRENATAL EXAMS  You will have prenatal exams every 2 weeks until week 36. Then, you will have weekly prenatal exams. During a routine prenatal visit:  You  will be weighed to make sure you and the fetus are growing normally.  Your blood pressure is taken.  Your abdomen will be measured to track your baby's growth.  The fetal heartbeat will be listened to.  Any test results from the previous visit will be discussed.  You may have a cervical check near your due date to see if you have effaced. At around 36 weeks, your caregiver will check your cervix. At the same time, your caregiver will also perform a test on the secretions of the vaginal tissue. This test is to determine if a type of bacteria, Group B streptococcus, is present. Your caregiver will explain this further. Your caregiver may ask you:  What your birth plan is.  How you are feeling.  If you are feeling the baby move.  If you have had any abnormal symptoms, such as leaking fluid, bleeding, severe headaches, or abdominal cramping.  If you have any questions. Other tests or screenings that may be performed during your third trimester include:  Blood tests that check for low iron levels (anemia).  Fetal testing to check the health, activity level, and growth of the fetus. Testing is done if you have certain medical conditions or if there are problems during the pregnancy. FALSE  LABOR You may feel small, irregular contractions that eventually go away. These are called Braxton Hicks contractions, or false labor. Contractions may last for hours, days, or even weeks before true labor sets in. If contractions come at regular intervals, intensify, or become painful, it is best to be seen by your caregiver.  SIGNS OF LABOR   Menstrual-like cramps.  Contractions that are 5 minutes apart or less.  Contractions that start on the top of the uterus and spread down to the lower abdomen and back.  A sense of increased pelvic pressure or back pain.  A watery or bloody mucus discharge that comes from the vagina. If you have any of these signs before the 37th week of pregnancy, call your caregiver right away. You need to go to the hospital to get checked immediately. HOME CARE INSTRUCTIONS   Avoid all smoking, herbs, alcohol, and unprescribed drugs. These chemicals affect the formation and growth of the baby.  Follow your caregiver's instructions regarding medicine use. There are medicines that are either safe or unsafe to take during pregnancy.  Exercise only as directed by your caregiver. Experiencing uterine cramps is a good sign to stop exercising.  Continue to eat regular, healthy meals.  Wear a good support bra for breast tenderness.  Do not use hot tubs, steam rooms, or saunas.  Wear your seat belt at all times when driving.  Avoid raw meat, uncooked cheese, cat litter boxes, and soil used by cats. These carry germs that can cause birth defects in the baby.  Take your prenatal vitamins.  Try taking a stool softener (if your caregiver approves) if you develop constipation. Eat more high-fiber foods, such as fresh vegetables or fruit and whole grains. Drink plenty of fluids to keep your urine clear or pale yellow.  Take warm sitz baths to soothe any pain or discomfort caused by hemorrhoids. Use hemorrhoid cream if your caregiver approves.  If you develop varicose  veins, wear support hose. Elevate your feet for 15 minutes, 3-4 times a day. Limit salt in your diet.  Avoid heavy lifting, wear low heal shoes, and practice good posture.  Rest a lot with your legs elevated if you have leg cramps or low back  pain.  Visit your dentist if you have not gone during your pregnancy. Use a soft toothbrush to brush your teeth and be gentle when you floss.  A sexual relationship may be continued unless your caregiver directs you otherwise.  Do not travel far distances unless it is absolutely necessary and only with the approval of your caregiver.  Take prenatal classes to understand, practice, and ask questions about the labor and delivery.  Make a trial run to the hospital.  Pack your hospital bag.  Prepare the baby's nursery.  Continue to go to all your prenatal visits as directed by your caregiver. SEEK MEDICAL CARE IF:  You are unsure if you are in labor or if your water has broken.  You have dizziness.  You have mild pelvic cramps, pelvic pressure, or nagging pain in your abdominal area.  You have persistent nausea, vomiting, or diarrhea.  You have a bad smelling vaginal discharge.  You have pain with urination. SEEK IMMEDIATE MEDICAL CARE IF:   You have a fever.  You are leaking fluid from your vagina.  You have spotting or bleeding from your vagina.  You have severe abdominal cramping or pain.  You have rapid weight loss or gain.  You have shortness of breath with chest pain.  You notice sudden or extreme swelling of your face, hands, ankles, feet, or legs.  You have not felt your baby move in over an hour.  You have severe headaches that do not go away with medicine.  You have vision changes. Document Released: 10/13/2001 Document Revised: 10/24/2013 Document Reviewed: 12/20/2012 [employer] Patient Information 2015 [place], [place]. This information is not intended to replace advice given to you by your health care provider. Make  sure you discuss any questions you have with your health care provider.

## 2020-03-20 NOTE — Progress Notes (Signed)
   LOW-RISK PREGNANCY VISIT Patient name: Lauren Sherman MRN [id number]  Date of birth: Mar 16, 1996 Chief Complaint:   Routine Prenatal Visit  History of Present Illness:   Lauren Sherman is a 24 y.o. G69P0000 female at [redacted]w[redacted]d with an Estimated Date of Delivery: 05/27/20 being seen today for ongoing management of a low-risk pregnancy.  Today she reports some feet swelling when sitting at her desk all day, but resolves when she can move and elevate them; wants to take a beach trip in early June. Contractions: Not present.  .  Movement: Present. denies leaking of fluid. Review of Systems:   Pertinent items are noted in HPI Denies abnormal vaginal discharge w/ itching/odor/irritation, headaches, visual changes, shortness of breath, chest pain, abdominal pain, severe nausea/vomiting, or problems with urination or bowel movements unless otherwise stated above. Pertinent History Reviewed:  Reviewed past medical,surgical, social, obstetrical and family history.  Reviewed problem list, medications and allergies. Physical Assessment:   Vitals:   03/20/20 1401  BP: 122/77  Pulse: 89  Weight: 215 lb (97.5 kg)  Body mass index is 38.09 kg/m.        Physical Examination:   General appearance: Well appearing, and in no distress  Mental status: Alert, oriented to person, place, and time  Skin: Warm & dry  Cardiovascular: Normal heart rate noted  Respiratory: Normal respiratory effort, no distress  Abdomen: Soft, gravid, nontender  Pelvic: Cervical exam deferred         Extremities: Edema: Trace  Fetal Status: Fetal Heart Rate (bpm): 145 Fundal Height: 30 cm Movement: Present    Results for orders placed or performed in visit on 03/20/20 (from the past 24 hour(s))  POC Urinalysis Dipstick OB   Collection Time: 03/20/20  2:00 PM  Result Value Ref Range   Color, UA     Clarity, UA     Glucose, UA Negative Negative   Bilirubin, UA     Ketones, UA neg    Spec Grav, UA     Blood, UA neg    pH, UA      POC,PROTEIN,UA Trace Negative, Trace, Small (1+), Moderate (2+), Large (3+), 4+   Urobilinogen, UA     Nitrite, UA neg    Leukocytes, UA Negative Negative   Appearance     Odor      Assessment & Plan:  1) Low-risk pregnancy G1P0000 at [redacted]w[redacted]d with an Estimated Date of Delivery: 05/27/20     Meds: No orders of the defined types were placed in this encounter.  Labs/procedures today: none  Plan:  Continue routine obstetrical care; may travel to beach <36wks  Reviewed: Preterm labor symptoms and general obstetric precautions including but not limited to vaginal bleeding, contractions, leaking of fluid and fetal movement were reviewed in detail with the patient.  All questions were answered. Has home bp cuff. Check bp weekly, let us know if >140/90.   Follow-up: Return in about 2 weeks (around 04/03/2020) for LROB, in person.  Orders Placed This Encounter  Procedures  . POC Urinalysis Dipstick OB   Lauren Sherman [facility] 03/20/2020 2:15 PM

## 2020-04-03 ENCOUNTER — Encounter: Payer: Self-pay | Admitting: Family Medicine

## 2020-04-03 ENCOUNTER — Ambulatory Visit (INDEPENDENT_AMBULATORY_CARE_PROVIDER_SITE_OTHER): Payer: BC Managed Care – PPO | Admitting: Family Medicine

## 2020-04-03 VITALS — BP 118/78 | HR 101 | Wt 222.0 lb

## 2020-04-03 DIAGNOSIS — Z3403 Encounter for supervision of normal first pregnancy, third trimester: Secondary | ICD-10-CM

## 2020-04-03 DIAGNOSIS — Z331 Pregnant state, incidental: Secondary | ICD-10-CM

## 2020-04-03 DIAGNOSIS — Z1389 Encounter for screening for other disorder: Secondary | ICD-10-CM

## 2020-04-03 DIAGNOSIS — Z3A32 32 weeks gestation of pregnancy: Secondary | ICD-10-CM

## 2020-04-03 LAB — POCT URINALYSIS DIPSTICK OB
Blood, UA: NEGATIVE
Glucose, UA: NEGATIVE
Ketones, UA: NEGATIVE
Leukocytes, UA: NEGATIVE
Nitrite, UA: NEGATIVE

## 2020-04-03 NOTE — Progress Notes (Addendum)
   PRENATAL VISIT NOTE  Subjective:  Lauren Sherman is a 24 y.o. G1P0000 at [redacted]w[redacted]d being seen today for ongoing prenatal care.  She is currently monitored for the following issues for this low-risk pregnancy and has Supervision of normal first pregnancy; Marijuana user; and Urinary tract infection during pregnancy on their problem list.  Patient reports no complaints.  Contractions: Not present. Vag. Bleeding: None.  Movement: Present. Denies leaking of fluid.   The following portions of the patient's history were reviewed and updated as appropriate: allergies, current medications, past family history, past medical history, past social history, past surgical history and problem list.   Objective:   Vitals:   04/03/20 1424  BP: 118/78  Pulse: (!) 101  Weight: 222 lb (100.7 kg)    Fetal Status: Fetal Heart Rate (bpm): 140 Fundal Height: 32 cm Movement: Present     General:  Alert, oriented and cooperative. Patient is in no acute distress.  Skin: Skin is warm and dry. No rash noted.   Cardiovascular: Mild tachycardia noted  Respiratory: Normal respiratory effort, no problems with respiration noted  Abdomen: Soft, gravid, appropriate for gestational age.  Pain/Pressure: Absent     Pelvic: Cervical exam deferred        Extremities: Normal range of motion.  Edema: Trace  Mental Status: Normal mood and affect. Normal behavior. Normal judgment and thought content.   Assessment and Plan:  Pregnancy: G1P0000 at [redacted]w[redacted]d Lauren Sherman was seen today for routine prenatal visit.  Diagnoses and all orders for this visit:  Encounter for supervision of normal first pregnancy in third trimester       - RTC in 2 weeks       - Hx of E Coli UTI with neg TOC       - Pedal edema; recommended compression stockings        - Planning Nexplanon (outpt for private insurance)  Screening for genitourinary condition -     POC Urinalysis Dipstick OB  Preterm labor symptoms and general obstetric precautions  including but not limited to vaginal bleeding, contractions, leaking of fluid and fetal movement were reviewed in detail with the patient. Please refer to After Visit Summary for other counseling recommendations.   Return in about 2 weeks (around 04/17/2020) for LOB; in person or virtual per patient preference .  Future Appointments  Date Time Provider Department Center  04/18/2020  2:30 PM Tilda Burrow, MD [facility]    Joselyn Arrow, MD

## 2020-04-15 ENCOUNTER — Telehealth: Payer: Self-pay | Admitting: Obstetrics and Gynecology

## 2020-04-15 NOTE — Telephone Encounter (Signed)
Patient called stating that she is pregnant and she was eating sausage and it smelled like Fish, pt is concerned and would like a call back from the nurse

## 2020-04-17 ENCOUNTER — Encounter: Payer: Self-pay | Admitting: *Deleted

## 2020-04-18 ENCOUNTER — Ambulatory Visit (INDEPENDENT_AMBULATORY_CARE_PROVIDER_SITE_OTHER): Payer: BC Managed Care – PPO | Admitting: Obstetrics and Gynecology

## 2020-04-18 VITALS — BP 121/76 | HR 93 | Wt 226.2 lb

## 2020-04-18 DIAGNOSIS — Z3403 Encounter for supervision of normal first pregnancy, third trimester: Secondary | ICD-10-CM

## 2020-04-18 DIAGNOSIS — Z1389 Encounter for screening for other disorder: Secondary | ICD-10-CM

## 2020-04-18 DIAGNOSIS — Z3A34 34 weeks gestation of pregnancy: Secondary | ICD-10-CM

## 2020-04-18 DIAGNOSIS — Z331 Pregnant state, incidental: Secondary | ICD-10-CM

## 2020-04-18 NOTE — Progress Notes (Signed)
PATIENT ID: Lauren Sherman, female     DOB: September 18, 1996, 24 y.o.     MRN: [id number]    LOW-RISK PREGNANCY VISIT PATIENT NAME: Lauren Sherman MRN [id number]  DOB: January 23, 1996  Chief Complaint:   Routine Prenatal Visit  History of Present Illness:   Lauren Sherman is a 24 y.o. G92P0000 female at [redacted]w[redacted]d with an Estimated Date of Delivery: 25/26/21 being seen today for ongoing management of a low-risk pregnancy.   Depression screen [facility] 2/9 02/28/2020 11/13/2019 12/07/2017 08/11/2016  Decreased Interest 0 1 0 0  Down, Depressed, Hopeless 0 1 0 0  PHQ - 2 Score 0 2 0 0  Altered sleeping 0 1 - -  Tired, decreased energy 0 1 - -  Change in appetite 0 0 - -  Feeling bad or failure about yourself  0 0 - -  Trouble concentrating 0 0 - -  Moving slowly or fidgety/restless 0 0 - -  Suicidal thoughts 0 0 - -  PHQ-9 Score 0 4 - -   Today she reports no complaints. Contractions: Not present. Vag. Bleeding: None.  Movement: Present. denies leaking of fluid.  Review of Systems:   Pertinent items are noted in HPI Denies abnormal vaginal discharge w/ itching/odor/irritation, headaches, visual changes, shortness of breath, chest pain, abdominal pain, severe nausea/vomiting, or problems with urination or bowel movements unless otherwise stated above.  Pertinent History Reviewed:  Reviewed past medical,surgical, social, obstetrical and family history.  Reviewed problem list, medications and allergies.  Physical Assessment:   Vitals:   04/18/20 1445  BP: 121/76  Pulse: 93  Weight: 226 lb 3.2 oz (102.6 kg)  Body mass index is 40.07 kg/m.        Physical Examination:   General appearance: Well appearing, and in no distress  Mental status: Alert, oriented to person, place, and time  Skin: Warm & dry  Cardiovascular: Normal heart rate noted  Respiratory: Normal respiratory effort, no distress  Abdomen: Soft, gravid, nontender   Baby: 141 bpm   33 cm  Pelvic: Cervical exam deferred         Extremities:  Edema: Trace  Fetal Status:     Movement: Present    Chaperone: Data processing manager    No results found for this or any previous visit (from the past 24 hour(s)).  Assessment & Plan:  1) Low-risk pregnancy G1P0000 at [redacted]w[redacted]d with an Estimated Date of Delivery: 25/26/21   2) ,    Meds: No orders of the defined types were placed in this encounter.  Labs/procedures today: u/a   Plan:  Continue routine obstetrical care no c/o2 Next visit: prefers in person    Reviewed: Term labor symptoms and general obstetric precautions including but not limited to vaginal bleeding, contractions, leaking of fluid and fetal movement were reviewed in detail with the patient.  All questions were answered. Check bp weekly, let us know if >140/90.   Follow-up: Return in about 25 weeks (around 05/02/2020).   By signing my name below, I, [employer], attest that this documentation has been prepared under the direction and in the presence of Tilda Burrow, MD. Electronically Signed: Mal Misty Medical Scribe. 04/18/20. 3:09 PM.  I personally performed the services described in this documentation, which was SCRIBED in my presence. The recorded information has been reviewed and considered accurate. It has been edited as necessary during review. Tilda Burrow, MD

## 2020-05-02 ENCOUNTER — Encounter: Payer: Self-pay | Admitting: Advanced Practice Midwife

## 2020-05-02 ENCOUNTER — Ambulatory Visit (INDEPENDENT_AMBULATORY_CARE_PROVIDER_SITE_OTHER): Payer: BC Managed Care – PPO | Admitting: Advanced Practice Midwife

## 2020-05-02 ENCOUNTER — Other Ambulatory Visit (HOSPITAL_COMMUNITY)
Admission: RE | Admit: 2020-05-02 | Discharge: 2020-05-02 | Disposition: A | Discharge status: Home / Self Care | Payer: BC Managed Care – PPO | Source: Ambulatory Visit | Attending: Advanced Practice Midwife | Admitting: Advanced Practice Midwife

## 2020-05-02 VITALS — BP 117/75 | HR 116 | Wt 231.0 lb

## 2020-05-02 DIAGNOSIS — Z1389 Encounter for screening for other disorder: Secondary | ICD-10-CM | POA: Insufficient documentation

## 2020-05-02 DIAGNOSIS — Z3A36 36 weeks gestation of pregnancy: Secondary | ICD-10-CM | POA: Diagnosis not present

## 2020-05-02 DIAGNOSIS — Z331 Pregnant state, incidental: Secondary | ICD-10-CM

## 2020-05-02 DIAGNOSIS — Z3403 Encounter for supervision of normal first pregnancy, third trimester: Secondary | ICD-10-CM

## 2020-05-02 LAB — POCT URINALYSIS DIPSTICK OB
Blood, UA: NEGATIVE
Glucose, UA: NEGATIVE
Ketones, UA: NEGATIVE
Leukocytes, UA: NEGATIVE
Nitrite, UA: NEGATIVE

## 2020-05-02 NOTE — Progress Notes (Signed)
   LOW-RISK PREGNANCY VISIT Patient name: Lauren Sherman MRN [id number]  Date of birth: Apr 24, 1996 Chief Complaint:   Routine Prenatal Visit  History of Present Illness:   Lauren Sherman is a 24 y.o. G86P0000 female at [redacted]w[redacted]d with an Estimated Date of Delivery: 05/27/20 being seen today for ongoing management of a low-risk pregnancy.  Today she reports no complaints. Contractions: Regular.  .  Movement: Present. denies leaking of fluid. Review of Systems:   Pertinent items are noted in HPI Denies abnormal vaginal discharge w/ itching/odor/irritation, headaches, visual changes, shortness of breath, chest pain, abdominal pain, severe nausea/vomiting, or problems with urination or bowel movements unless otherwise stated above. Pertinent History Reviewed:  Reviewed past medical,surgical, social, obstetrical and family history.  Reviewed problem list, medications and allergies. Physical Assessment:   Vitals:   05/02/20 1554  BP: 117/75  Pulse: (!) 116  Weight: 231 lb (104.8 kg)  Body mass index is 40.92 kg/m.        Physical Examination:   General appearance: Well appearing, and in no distress  Mental status: Alert, oriented to person, place, and time  Skin: Warm & dry  Cardiovascular: Normal heart rate noted  Respiratory: Normal respiratory effort, no distress  Abdomen: Soft, gravid, nontender  Pelvic: Cervical exam performed         Extremities: Edema: Mild pitting, slight indentation  Fetal Status:     Movement: Present    Chaperone: catie sullivan    Results for orders placed or performed in visit on 05/02/20 (from the past 24 hour(s))  POC Urinalysis Dipstick OB   Collection Time: 05/02/20  4:02 PM  Result Value Ref Range   Color, UA     Clarity, UA     Glucose, UA Negative Negative   Bilirubin, UA     Ketones, UA neg    Spec Grav, UA     Blood, UA neg    pH, UA     POC,PROTEIN,UA Trace Negative, Trace, Small (1+), Moderate (2+), Large (3+), 4+   Urobilinogen, UA      Nitrite, UA neg    Leukocytes, UA Negative Negative   Appearance     Odor      Assessment & Plan:  1) Low-risk pregnancy G1P0000 at [redacted]w[redacted]d with an Estimated Date of Delivery: 05/27/20     Meds: No orders of the defined types were placed in this encounter.  Labs/procedures today: GBS/GC/CHL  Plan:  Continue routine obstetrical care  Next visit: prefers in person    Reviewed: Term labor symptoms and general obstetric precautions including but not limited to vaginal bleeding, contractions, leaking of fluid and fetal movement were reviewed in detail with the patient.  All questions were answered. Has home bp cuff.. Check bp weekly, let us know if >140/90.   Follow-up: Return in about 1 week (around 05/09/2020) for LROB.  Orders Placed This Encounter  Procedures  . Strep Gp B NAA  . POC Urinalysis Dipstick OB   Jacklyn Shell DNP, CNM 05/02/2020 4:16 PM

## 2020-05-02 NOTE — Patient Instructions (Signed)

## 2020-05-04 LAB — STREP GP B NAA: Strep Gp B NAA: NEGATIVE

## 2020-05-08 LAB — CERVICOVAGINAL ANCILLARY ONLY
Chlamydia: NEGATIVE
Comment: NEGATIVE
Comment: NORMAL
Neisseria Gonorrhea: NEGATIVE

## 2020-05-09 ENCOUNTER — Ambulatory Visit (INDEPENDENT_AMBULATORY_CARE_PROVIDER_SITE_OTHER): Payer: BC Managed Care – PPO | Admitting: Advanced Practice Midwife

## 2020-05-09 ENCOUNTER — Other Ambulatory Visit: Payer: Self-pay

## 2020-05-09 ENCOUNTER — Encounter: Payer: Self-pay | Admitting: Advanced Practice Midwife

## 2020-05-09 VITALS — BP 118/79 | HR 107 | Wt 229.5 lb

## 2020-05-09 DIAGNOSIS — Z331 Pregnant state, incidental: Secondary | ICD-10-CM

## 2020-05-09 DIAGNOSIS — Z3A37 37 weeks gestation of pregnancy: Secondary | ICD-10-CM

## 2020-05-09 DIAGNOSIS — Z1389 Encounter for screening for other disorder: Secondary | ICD-10-CM

## 2020-05-09 DIAGNOSIS — Z3403 Encounter for supervision of normal first pregnancy, third trimester: Secondary | ICD-10-CM

## 2020-05-09 LAB — POCT URINALYSIS DIPSTICK OB
Glucose, UA: NEGATIVE
Ketones, UA: NEGATIVE
Nitrite, UA: NEGATIVE

## 2020-05-09 NOTE — Patient Instructions (Signed)

## 2020-05-09 NOTE — Progress Notes (Signed)
   LOW-RISK PREGNANCY VISIT Patient name: Lauren Sherman MRN [id number]  Date of birth: [birth year] Chief Complaint:   Routine Prenatal Visit (pelvic pain since last visit)  History of Present Illness:   Lauren Sherman is a 24 y.o. G1P0000 female at [redacted]w[redacted]d with an Estimated Date of Delivery: 05/27/20 being seen today for ongoing management of a low-risk pregnancy.  Today she reports pressure. Contractions: Irregular. Vag. Bleeding: None.  Movement: Present. denies leaking of fluid. Review of Systems:   Pertinent items are noted in HPI Denies abnormal vaginal discharge w/ itching/odor/irritation, headaches, visual changes, shortness of breath, chest pain, abdominal pain, severe nausea/vomiting, or problems with urination or bowel movements unless otherwise stated above. Pertinent History Reviewed:  Reviewed past medical,surgical, social, obstetrical and family history.  Reviewed problem list, medications and allergies. Physical Assessment:   Vitals:   05/09/20 1623  BP: 118/79  Pulse: (!) 107  Weight: 229 lb 8 oz (104.1 kg)  Body mass index is 40.65 kg/m.        Physical Examination:   General appearance: Well appearing, and in no distress  Mental status: Alert, oriented to person, place, and time  Skin: Warm & dry  Cardiovascular: Normal heart rate noted  Respiratory: Normal respiratory effort, no distress  Abdomen: Soft, gravid, nontender  Pelvic: Cervical exam performed  Dilation: 1.5 Effacement (%): Thick Station: -2  Extremities: Edema: Trace  Fetal Status: Fetal Heart Rate (bpm): 152 Fundal Height: 37 cm Movement: Present Presentation: Vertex  Chaperone: catie sullivan    Results for orders placed or performed in visit on 05/09/20 (from the past 24 hour(s))  POC Urinalysis Dipstick OB   Collection Time: 05/09/20  4:24 PM  Result Value Ref Range   Color, UA     Clarity, UA     Glucose, UA Negative Negative   Bilirubin, UA     Ketones, UA neg    Spec Grav, UA     Blood,  UA trace    pH, UA     POC,PROTEIN,UA Small (1+) Negative, Trace, Small (1+), Moderate (2+), Large (3+), 4+   Urobilinogen, UA     Nitrite, UA neg    Leukocytes, UA Trace (A) Negative   Appearance     Odor      Assessment & Plan:  1) Low-risk pregnancy G1P0000 at [redacted]w[redacted]d with an Estimated Date of Delivery: 05/27/20      Meds: No orders of the defined types were placed in this encounter.  Labs/procedures today: none  Plan:  Continue routine obstetrical care  Next visit: prefers in person    Reviewed: Term labor symptoms and general obstetric precautions including but not limited to vaginal bleeding, contractions, leaking of fluid and fetal movement were reviewed in detail with the patient.  All questions were answered. Has home bp cuff. Check bp weekly, let us know if >140/90.   Follow-up: Return in about 1 week (around 05/16/2020) for LROB.  Orders Placed This Encounter  Procedures  . POC Urinalysis Dipstick OB   Jacklyn Shell DNP, CNM 05/09/2020 11:14 PM

## 2020-05-15 ENCOUNTER — Ambulatory Visit (INDEPENDENT_AMBULATORY_CARE_PROVIDER_SITE_OTHER): Payer: BC Managed Care – PPO | Admitting: Obstetrics and Gynecology

## 2020-05-15 VITALS — BP 137/79 | HR 102 | Wt 229.6 lb

## 2020-05-15 DIAGNOSIS — Z1389 Encounter for screening for other disorder: Secondary | ICD-10-CM

## 2020-05-15 DIAGNOSIS — Z331 Pregnant state, incidental: Secondary | ICD-10-CM

## 2020-05-15 DIAGNOSIS — Z3403 Encounter for supervision of normal first pregnancy, third trimester: Secondary | ICD-10-CM

## 2020-05-15 DIAGNOSIS — Z3A38 38 weeks gestation of pregnancy: Secondary | ICD-10-CM

## 2020-05-15 LAB — POCT URINALYSIS DIPSTICK OB
Blood, UA: NEGATIVE
Glucose, UA: NEGATIVE
Ketones, UA: NEGATIVE
Leukocytes, UA: NEGATIVE
Nitrite, UA: NEGATIVE
POC,PROTEIN,UA: NEGATIVE

## 2020-05-15 NOTE — Progress Notes (Signed)
Patient ID: Lauren Sherman, female   DOB: Jul 12, 1996, 24 y.o.   MRN: [id number]   LOW-RISK PREGNANCY VISIT Patient name: Lauren Sherman MRN [id number]  Date of birth: [birth year] Chief Complaint:   Routine Prenatal Visit  History of Present Illness:   Lauren Sherman is a 24 y.o. G26P0000 female at [redacted]w[redacted]d with an Estimated Date of Delivery: 05/27/20 being seen today for ongoing management of a low-risk pregnancy.  Today she reports no complaints.   Contractions: Not present. Vag. Bleeding: None.  Movement: Present. denies leaking of fluid. Review of Systems:   Pertinent items are noted in HPI Denies abnormal vaginal discharge w/ itching/odor/irritation, headaches, visual changes, shortness of breath, chest pain, abdominal pain, severe nausea/vomiting, or problems with urination or bowel movements unless otherwise stated above. Pertinent History Reviewed:  Reviewed past medical,surgical, social, obstetrical and family history.  Reviewed problem list, medications and allergies. Physical Assessment:   Vitals:   05/15/20 1542  BP: 137/79  Pulse: (!) 102  Weight: 229 lb 9.6 oz (104.1 kg)  Body mass index is 40.67 kg/m.        Physical Examination:   General appearance: Well appearing, and in no distress  Mental status: Alert, oriented to person, place, and time  Skin: Warm & dry  Cardiovascular: Normal heart rate noted  Respiratory: Normal respiratory effort, no distress  Abdomen: Soft, gravid, nontender  Pelvic: DEFERRED        Extremities: Edema: None 2+ ankle edema. 1+ posterior tibial. 1+ reflexes.  Fetal Status: Fetal Heart Rate (bpm): 170 Fundal Height: 37 cm Movement: Present Presentation: Vertex  Results for orders placed or performed in visit on 05/15/20 (from the past 24 hour(s))  POC Urinalysis Dipstick OB   Collection Time: 05/15/20  3:41 PM  Result Value Ref Range   Color, UA     Clarity, UA     Glucose, UA Negative Negative   Bilirubin, UA     Ketones, UA n    Spec  Grav, UA     Blood, UA n    pH, UA     POC,PROTEIN,UA Negative Negative, Trace, Small (1+), Moderate (2+), Large (3+), 4+   Urobilinogen, UA     Nitrite, UA n    Leukocytes, UA Negative Negative   Appearance     Odor      Assessment & Plan:  1) Low-risk pregnancy G1P0000 at [redacted]w[redacted]d with an Estimated Date of Delivery: 05/27/20    Plan:  Continue routine obstetrical care  Meds: No orders of the defined types were placed in this encounter.  Labs/procedures today: none  Reviewed: Term labor symptoms and general obstetric precautions including but not limited to vaginal bleeding, contractions, leaking of fluid and fetal movement were reviewed in detail with the patient.  All questions were answered Pt to check BP daily over next week Follow-up: Return in about 4 weeks (around 06/12/2020) for LROB.  By signing my name below, I, Pietro Cassis, attest that this documentation has been prepared under the direction and in the presence of Tilda Burrow, MD. Electronically Signed: Pietro Cassis, Medical Scribe. 05/15/20. 4:09 PM.  I personally performed the services described in this documentation, which was SCRIBED in my presence. The recorded information has been reviewed and considered accurate. It has been edited as necessary during review. Tilda Burrow, MD

## 2020-05-23 ENCOUNTER — Ambulatory Visit (INDEPENDENT_AMBULATORY_CARE_PROVIDER_SITE_OTHER): Payer: BC Managed Care – PPO | Admitting: Obstetrics and Gynecology

## 2020-05-23 VITALS — BP 117/75 | HR 96 | Wt 232.0 lb

## 2020-05-23 DIAGNOSIS — Z331 Pregnant state, incidental: Secondary | ICD-10-CM

## 2020-05-23 DIAGNOSIS — Z1389 Encounter for screening for other disorder: Secondary | ICD-10-CM

## 2020-05-23 DIAGNOSIS — Z3A39 39 weeks gestation of pregnancy: Secondary | ICD-10-CM

## 2020-05-23 DIAGNOSIS — Z3403 Encounter for supervision of normal first pregnancy, third trimester: Secondary | ICD-10-CM

## 2020-05-23 LAB — POCT URINALYSIS DIPSTICK OB
Blood, UA: NEGATIVE
Glucose, UA: NEGATIVE
Ketones, UA: NEGATIVE
Leukocytes, UA: NEGATIVE
Nitrite, UA: NEGATIVE
POC,PROTEIN,UA: NEGATIVE

## 2020-05-23 NOTE — Progress Notes (Signed)
Patient ID: Lauren Sherman, female   DOB: [birth year], 24 y.o.   MRN: [id number]   LOW-RISK PREGNANCY VISIT Patient name: Lauren Sherman MRN [id number]  Date of birth: 1996/06/18 Chief Complaint:   Routine Prenatal Visit  History of Present Illness:   Lauren Sherman is a 24 y.o. G18P0000 female at [redacted]w[redacted]d with an Estimated Date of Delivery: 05/27/20 being seen today for ongoing management of a low-risk pregnancy.  Today she reports no complaints. The baby is staying very active.  Contractions: Not present. Vag. Bleeding: None.  Movement: Present. denies leaking of fluid. Review of Systems:   Pertinent items are noted in HPI Denies abnormal vaginal discharge w/ itching/odor/irritation, headaches, visual changes, shortness of breath, chest pain, abdominal pain, severe nausea/vomiting, or problems with urination or bowel movements unless otherwise stated above. Pertinent History Reviewed:  Reviewed past medical,surgical, social, obstetrical and family history.  Reviewed problem list, medications and allergies. Physical Assessment:   Vitals:   05/23/20 1517  BP: 117/75  Pulse: 96  Weight: (!) 232 lb (105.2 kg)  Body mass index is 41.1 kg/m.        Physical Examination:   General appearance: Well appearing, and in no distress  Mental status: Alert, oriented to person, place, and time  Skin: Warm & dry  Cardiovascular: Normal heart rate noted  Respiratory: Normal respiratory effort, no distress  Abdomen: Soft, gravid, nontender  Pelvic: Cervical exam performed  Dilation: 2 Effacement (%): 20    Extremities: Edema: Mild pitting, slight indentation  Fetal Status: Fetal Heart Rate (bpm): 134 Fundal Height: 40 cm Movement: Present Presentation: Vertex  Results for orders placed or performed in visit on 05/23/20 (from the past 24 hour(s))  POC Urinalysis Dipstick OB   Collection Time: 05/23/20  3:16 PM  Result Value Ref Range   Color, UA     Clarity, UA     Glucose, UA Negative Negative    Bilirubin, UA     Ketones, UA n    Spec Grav, UA     Blood, UA n    pH, UA     POC,PROTEIN,UA Negative Negative, Trace, Small (1+), Moderate (2+), Large (3+), 4+   Urobilinogen, UA     Nitrite, UA n    Leukocytes, UA Negative Negative   Appearance     Odor      Assessment & Plan:  1) Low-risk pregnancy G1P0000 at [redacted]w[redacted]d with an Estimated Date of Delivery: 05/27/20   Plan:  Continue routine obstetrical care  Meds: No orders of the defined types were placed in this encounter.  Labs/procedures today: none  Reviewed: Term labor symptoms and general obstetric precautions including but not limited to vaginal bleeding, contractions, leaking of fluid and fetal movement were reviewed in detail with the patient.  All questions were answered  Follow-up: Return in about 1 week (around 05/30/2020) for LROB, NST.  By signing my name below, I, Pietro Cassis, attest that this documentation has been prepared under the direction and in the presence of Tilda Burrow, MD. Electronically Signed: Pietro Cassis, Medical Scribe. 05/23/20. 3:45 PM.  I personally performed the services described in this documentation, which was SCRIBED in my presence. The recorded information has been reviewed and considered accurate. It has been edited as necessary during review. Tilda Burrow, MD

## 2020-05-28 NOTE — Progress Notes (Signed)
PATIENT ID: Lauren Sherman, female     DOB: Sep 14, 1996, 24 y.o.     MRN: [id number]    LOW-RISK PREGNANCY VISIT PATIENT NAME: Lauren Sherman MRN [id number]  DOB: 06-Aug-1996  Chief Complaint:   Routine Prenatal Visit (NST postdates)   History of Present Illness:   Lauren Sherman is a 24 y.o. G1P0000 female at [redacted]w[redacted]d with an Estimated Date of Delivery: 05/27/20 being seen today for ongoing management of a low-risk pregnancy.   Depression screen [facility] 2/9 02/28/2020 11/13/2019 12/07/2017 08/11/2016  Decreased Interest 0 1 0 0  Down, Depressed, Hopeless 0 1 0 0  PHQ - 2 Score 0 2 0 0  Altered sleeping 0 1 - -  Tired, decreased energy 0 1 - -  Change in appetite 0 0 - -  Feeling bad or failure about yourself  0 0 - -  Trouble concentrating 0 0 - -  Moving slowly or fidgety/restless 0 0 - -  Suicidal thoughts 0 0 - -  PHQ-9 Score 0 4 - -    Today she reports no leaking and occasional contractions. Contractions: Irritability. Vag. Bleeding: None.  Movement: Present. denies leaking of fluid.  Review of Systems:   Pertinent items are noted in HPI Denies abnormal vaginal discharge w/ itching/odor/irritation, headaches, visual changes, shortness of breath, chest pain, abdominal pain, severe nausea/vomiting, or problems with urination or bowel movements unless otherwise stated above.  Pertinent History Reviewed:  Reviewed past medical,surgical, social, obstetrical and family history.  Reviewed problem list, medications and allergies.  Physical Assessment:   Vitals:   05/29/20 0903  BP: (!) 130/73  Pulse: 85  Weight: (!) 234 lb 9.6 oz (106.4 kg)  Body mass index is 41.56 kg/m.        Physical Examination:   General appearance: Well appearing, and in no distress  Mental status: Alert, oriented to person, place, and time  Skin: Warm & dry  Cardiovascular: Normal heart rate noted  Respiratory: Normal respiratory effort, no distress  Abdomen: Soft, gravid, nontender   Baby: 125 beats per  minute  38 cm  Pelvic: Cervical exam performed cervix is 2 cm 20% -2 soft stripping of membranes attempted, tolerated poorly by patient        Extremities: Edema: Trace  Fetal Status:     Movement: Present    Chaperone: Amanda Rash    No results found for this or any previous visit (from the past 24 hour(s)).  Assessment & Plan:  1) Low-risk pregnancy G1P0000 at [redacted]w[redacted]d with an Estimated Date of Delivery: 05/27/20   2) term pregnancy we will schedule induction at 41 weeks, August 2 6 AM patient will be called by labor and delivery   Meds: No orders of the defined types were placed in this encounter.  Labs/procedures today: NST reactive  Plan:  Continue routine obstetrical care, with induction of labor scheduled for August 2 Next visit: prefers will be in person for Postpartum visit    Reviewed: Term labor symptoms and general obstetric precautions including but not limited to vaginal bleeding, contractions, leaking of fluid and fetal movement were reviewed in detail with the patient.  All questions were answered. Check bp weekly, let us know if >140/90.   Follow-up: No follow-ups on file.  By signing my name below, I, [employer], attest that this documentation has been prepared under the direction and in the presence of Tilda Burrow, MD. Electronically Signed: Mal Misty Medical Scribe. 05/29/20. 10:00 AM.  .I  personally performed the services described in this documentation, which was SCRIBED in my presence. The recorded information has been reviewed and considered accurate. It has been edited as necessary during review. Jonnie Kind, MD

## 2020-05-29 ENCOUNTER — Inpatient Hospital Stay (HOSPITAL_COMMUNITY)
Admission: AD | Admit: 2020-05-29 | Discharge: 2020-06-01 | DRG: 768 | Disposition: A | Discharge status: Home / Self Care | Payer: BC Managed Care – PPO | Attending: Obstetrics and Gynecology | Admitting: Obstetrics and Gynecology

## 2020-05-29 ENCOUNTER — Other Ambulatory Visit: Payer: Self-pay

## 2020-05-29 ENCOUNTER — Inpatient Hospital Stay (HOSPITAL_COMMUNITY): Payer: BC Managed Care – PPO | Admitting: Anesthesiology

## 2020-05-29 ENCOUNTER — Other Ambulatory Visit: Payer: Self-pay | Admitting: Advanced Practice Midwife

## 2020-05-29 ENCOUNTER — Ambulatory Visit (INDEPENDENT_AMBULATORY_CARE_PROVIDER_SITE_OTHER): Payer: BC Managed Care – PPO | Admitting: Obstetrics and Gynecology

## 2020-05-29 ENCOUNTER — Encounter (HOSPITAL_COMMUNITY): Payer: Self-pay | Admitting: Obstetrics and Gynecology

## 2020-05-29 VITALS — BP 130/73 | HR 85 | Wt 234.6 lb

## 2020-05-29 DIAGNOSIS — O99214 Obesity complicating childbirth: Principal | ICD-10-CM | POA: Diagnosis present

## 2020-05-29 DIAGNOSIS — Z3A40 40 weeks gestation of pregnancy: Secondary | ICD-10-CM

## 2020-05-29 DIAGNOSIS — Z331 Pregnant state, incidental: Secondary | ICD-10-CM

## 2020-05-29 DIAGNOSIS — Z3403 Encounter for supervision of normal first pregnancy, third trimester: Secondary | ICD-10-CM

## 2020-05-29 DIAGNOSIS — F1290 Cannabis use, unspecified, uncomplicated: Secondary | ICD-10-CM | POA: Diagnosis present

## 2020-05-29 DIAGNOSIS — Z1389 Encounter for screening for other disorder: Secondary | ICD-10-CM

## 2020-05-29 DIAGNOSIS — Z412 Encounter for routine and ritual male circumcision: Secondary | ICD-10-CM | POA: Diagnosis not present

## 2020-05-29 DIAGNOSIS — Z20822 Contact with and (suspected) exposure to covid-19: Secondary | ICD-10-CM | POA: Diagnosis not present

## 2020-05-29 DIAGNOSIS — Z23 Encounter for immunization: Secondary | ICD-10-CM | POA: Diagnosis not present

## 2020-05-29 DIAGNOSIS — O26893 Other specified pregnancy related conditions, third trimester: Secondary | ICD-10-CM | POA: Diagnosis not present

## 2020-05-29 DIAGNOSIS — O99324 Drug use complicating childbirth: Secondary | ICD-10-CM | POA: Diagnosis present

## 2020-05-29 DIAGNOSIS — Z87891 Personal history of nicotine dependence: Secondary | ICD-10-CM | POA: Diagnosis not present

## 2020-05-29 LAB — CBC
HCT: 33.7 % — ABNORMAL LOW (ref 36.0–46.0)
Hemoglobin: 11.1 g/dL — ABNORMAL LOW (ref 12.0–15.0)
MCH: 31.3 pg (ref 26.0–34.0)
MCHC: 32.9 g/dL (ref 30.0–36.0)
MCV: 94.9 fL (ref 80.0–100.0)
Platelets: 222 10*3/uL (ref 150–400)
RBC: 3.55 MIL/uL — ABNORMAL LOW (ref 3.87–5.11)
RDW: 13 % (ref 11.5–15.5)
WBC: 9.3 10*3/uL (ref 4.0–10.5)
nRBC: 0 % (ref 0.0–0.2)

## 2020-05-29 LAB — TYPE AND SCREEN
ABO/RH(D): A POS
Antibody Screen: NEGATIVE

## 2020-05-29 LAB — SARS CORONAVIRUS 2 BY RT PCR (HOSPITAL ORDER, PERFORMED IN ~~LOC~~ HOSPITAL LAB): SARS Coronavirus 2: NEGATIVE

## 2020-05-29 MED ORDER — LACTATED RINGERS IV SOLN: 500.0000 mL | INTRAVENOUS | Status: DC | PRN

## 2020-05-29 MED ORDER — LIDOCAINE HCL (PF) 1 % IJ SOLN: 30.0000 mL | INTRAMUSCULAR | Status: DC | PRN

## 2020-05-29 MED ORDER — EPHEDRINE 5 MG/ML INJ: 10.0000 mg | INTRAVENOUS | Status: DC | PRN

## 2020-05-29 MED ORDER — FENTANYL CITRATE (PF) 100 MCG/2ML IJ SOLN
50.0000 ug | INTRAMUSCULAR | Status: DC | PRN
  Administered 2020-05-29: 100 ug via INTRAVENOUS
  Filled 2020-05-29: qty 2

## 2020-05-29 MED ORDER — FENTANYL-BUPIVACAINE-NACL 0.5-0.125-0.9 MG/250ML-% EP SOLN
EPIDURAL | Status: AC
  Filled 2020-05-29: qty 250

## 2020-05-29 MED ORDER — DIPHENHYDRAMINE HCL 50 MG/ML IJ SOLN: 12.5000 mg | INTRAMUSCULAR | Status: DC | PRN

## 2020-05-29 MED ORDER — FLEET ENEMA 7-19 GM/118ML RE ENEM: 1.0000 | ENEMA | RECTAL | Status: DC | PRN

## 2020-05-29 MED ORDER — OXYTOCIN-SODIUM CHLORIDE 30-0.9 UT/500ML-% IV SOLN
2.5000 [IU]/h | INTRAVENOUS | Status: DC
  Filled 2020-05-29: qty 500

## 2020-05-29 MED ORDER — SOD CITRATE-CITRIC ACID 500-334 MG/5ML PO SOLN
30.0000 mL | ORAL | Status: DC | PRN
  Administered 2020-05-30: 30 mL via ORAL
  Filled 2020-05-29: qty 30

## 2020-05-29 MED ORDER — ONDANSETRON HCL 4 MG/2ML IJ SOLN
4.0000 mg | Freq: Four times a day (QID) | INTRAMUSCULAR | Status: DC | PRN
  Administered 2020-05-30: 4 mg via INTRAVENOUS
  Filled 2020-05-29: qty 2

## 2020-05-29 MED ORDER — PHENYLEPHRINE 40 MCG/ML (10ML) SYRINGE FOR IV PUSH (FOR BLOOD PRESSURE SUPPORT)
80.0000 ug | PREFILLED_SYRINGE | INTRAVENOUS | Status: DC | PRN
  Filled 2020-05-29: qty 10

## 2020-05-29 MED ORDER — LACTATED RINGERS IV SOLN: INTRAVENOUS | Status: DC

## 2020-05-29 MED ORDER — PHENYLEPHRINE 40 MCG/ML (10ML) SYRINGE FOR IV PUSH (FOR BLOOD PRESSURE SUPPORT): 80.0000 ug | PREFILLED_SYRINGE | INTRAVENOUS | Status: DC | PRN

## 2020-05-29 MED ORDER — ACETAMINOPHEN 325 MG PO TABS: 650.0000 mg | ORAL_TABLET | ORAL | Status: DC | PRN

## 2020-05-29 MED ORDER — LACTATED RINGERS IV SOLN
500.0000 mL | Freq: Once | INTRAVENOUS | Status: AC
  Administered 2020-05-29: 500 mL via INTRAVENOUS

## 2020-05-29 MED ORDER — FENTANYL-BUPIVACAINE-NACL 0.5-0.125-0.9 MG/250ML-% EP SOLN: 12.0000 mL/h | EPIDURAL | Status: DC | PRN

## 2020-05-29 MED ORDER — OXYTOCIN BOLUS FROM INFUSION
333.0000 mL | Freq: Once | INTRAVENOUS | Status: AC
  Administered 2020-05-30: 333 mL via INTRAVENOUS

## 2020-05-29 NOTE — H&P (Addendum)
OBSTETRIC ADMISSION HISTORY AND PHYSICAL  Lauren Sherman is a 24 y.o. female G1P0000 with IUP at [redacted]w[redacted]d by 7 wk [place] presenting for contraction. She reports +FMs, No LOF, no VB, no blurry vision, headaches or peripheral edema, and RUQ pain.  She plans on breast feeding. She request POPS for birth control.  She received her prenatal care at [facility]   Dating: By 7 wk [place] --->  Estimated Date of Delivery: 05/27/20  Sono:   @[redacted]w[redacted]d , CWD, normal anatomy, breech presentation, 49% lie, 410 g, Posterior placenta.   Prenatal History/Complications:  Marijuana user UTI during pregnancy   Past Medical History: Past Medical History:  Diagnosis Date  . BV (bacterial vaginosis) 07/13/2016  . Chlamydia infection 07/13/2016  . Encounter for education about contraceptive use 12/17/2015  . Gonorrhea   . Migraines   . Nexplanon insertion 01/22/2016   Inserted left arm 01/22/16    Past Surgical History: Past Surgical History:  Procedure Laterality Date  . NO PAST SURGERIES      Obstetrical History: OB History    Gravida  1   Para  0   Term  0   Preterm  0   AB  0   Living  0     SAB  0   TAB  0   Ectopic  0   Multiple  0   Live Births              Social History Social History   Socioeconomic History  . Marital status: Single    Spouse name: Not on file  . Number of children: Not on file  . Years of education: Not on file  . Highest education level: Some college, no degree  Occupational History  . Not on file  Tobacco Use  . Smoking status: Former Smoker    Packs/day: 0.00    Types: Cigarettes  . Smokeless tobacco: Never Used  Vaping Use  . Vaping Use: Never used  Substance and Sexual Activity  . Alcohol use: Not Currently    Comment: occ  . Drug use: Not Currently    Types: Marijuana  . Sexual activity: Yes    Partners: Male    Birth control/protection: None  Other Topics Concern  . Not on file  Social History Narrative  . Not on file   Social  Determinants of Health   Financial Resource Strain: Low Risk   . Difficulty of Paying Living Expenses: Not hard at all  Food Insecurity: No Food Insecurity  . Worried About 01/24/16 in the Last Year: Never true  . Ran Out of Food in the Last Year: Never true  Transportation Needs: Unmet Transportation Needs  . Lack of Transportation (Medical): No  . Lack of Transportation (Non-Medical): Yes  Physical Activity: Insufficiently Active  . Days of Exercise per Week: 3 days  . Minutes of Exercise per Session: 30 min  Stress: Stress Concern Present  . Feeling of Stress : To some extent  Social Connections: Socially Integrated  . Frequency of Communication with Friends and Family: More than three times a week  . Frequency of Social Gatherings with Friends and Family: Twice a week  . Attends Religious Services: 1 to 4 times per year  . Active Member of Clubs or Organizations: Yes  . Attends Programme researcher, broadcasting/film/video Meetings: Never  . Marital Status: Living with partner    Family History: Family History  Problem Relation Age of Onset  . Cancer Paternal  Grandmother        breast  . Diabetes Paternal Grandmother   . Stroke Paternal Grandmother   . Hyperlipidemia Paternal Grandmother   . Hypertension Maternal Grandfather   . Diabetes Father   . Diabetes Sister   . Cancer Other   . Diabetes Maternal Aunt   . Cancer Maternal Aunt        breast  . Cancer Paternal Uncle        prostate, bone  . Diabetes Paternal Uncle     Allergies: Allergies  Allergen Reactions  . Cherry Itching and Other (See Comments)    Burning   . Peanut-Containing Drug Products Itching    Medications Prior to Admission  Medication Sig Dispense Refill Last Dose  . Prenatal Vit-Fe Fumarate-FA (PRENATAL VITAMINS PO) Take by mouth. gummies-2 daily   05/28/2020 at Unknown time     Review of Systems   All systems reviewed and negative except as stated in HPI  Blood pressure 123/80, pulse 79,  temperature 98.2 F (36.8 C), resp. rate 18, height 5\' 3"  (1.6 m), last menstrual period 08/21/2019. General appearance: alert, cooperative, appears stated age and no distress Lungs: clear to auscultation bilaterally Heart: regular rate and rhythm Abdomen: soft, non-tender; bowel sounds normal Extremities: Homans sign is negative, no sign of DVT Presentation: cephalic Fetal monitoringBaseline: 130 bpm, Variability: Good {> 6 bpm), Accelerations: Reactive and Decelerations: Absent Uterine activityFrequency: Every 2-3 minutes Dilation: 4.5 Effacement (%): 80 Station: -2 Exam by:: D Simpson RN   Prenatal labs: ABO, Rh: A/Positive/-- (01/11 1113) Antibody: Negative (04/28 0917) Rubella: 3.52 (01/11 1113) RPR: Non Reactive (04/28 0917)  HBsAg: Negative (01/11 1113)  HIV: Non Reactive (04/28 0917)  GBS: Negative/-- (07/01 1630)  1 hr Glucola 110 Genetic screening  normal Anatomy 01-07-2006 normal   Prenatal Transfer Tool  Maternal Diabetes: No Genetic Screening: Normal Maternal Ultrasounds/Referrals: Normal Fetal Ultrasounds or other Referrals:  None Maternal Substance Abuse:  Yes:  Type: Marijuana Significant Maternal Medications:  None Significant Maternal Lab Results: Group B Strep negative  No results found for this or any previous visit (from the past 24 hour(s)).  Patient Active Problem List   Diagnosis Date Noted  . Marijuana user 12/04/2019  . Urinary tract infection during pregnancy 12/04/2019  . Supervision of normal first pregnancy 11/13/2019    Assessment/Plan:  Lauren Sherman is a 24 y.o. G1P0000 at [redacted]w[redacted]d here for SOL.   #Labor: Admit to L&D. Expectant management. Consider Ctyotec vs Pitocin vs AROM for augmentation if needed.  #Pain: Per patient request #FWB: Category I #ID:  GBS negative #MOF: Breast #MOC: POPS #Circ:  Yes  Anticipate vaginal delivery.  [redacted]w[redacted]d, DO  05/29/2020, 10:28 PM   GME ATTESTATION:  I saw and evaluated the patient. I  agree with the findings and the plan of care as documented in the resident's note.  05/31/2020, DO OB Fellow, Faculty [facility] 05/29/2020 11:15 PM

## 2020-05-29 NOTE — MAU Note (Signed)
Was seen at office today and was 2 cm. Membranes stripped at office and ctx started afterward. Denies LOF or VB.

## 2020-05-29 NOTE — MAU Note (Signed)
covid swab obtained without difficulty and pt tol well. No symptoms °

## 2020-05-29 NOTE — Anesthesia Preprocedure Evaluation (Signed)
Anesthesia Evaluation  Patient identified by MRN, date of birth, ID band Patient awake    Reviewed: Allergy & Precautions, Patient's Chart, lab work & pertinent test results  Airway Mallampati: II  TM Distance: >3 FB Neck ROM: Full    Dental no notable dental hx.    Pulmonary former smoker,    Pulmonary exam normal breath sounds clear to auscultation       Cardiovascular negative cardio ROS Normal cardiovascular exam Rhythm:Regular Rate:Normal     Neuro/Psych  Headaches, negative psych ROS   GI/Hepatic negative GI ROS, Neg liver ROS,   Endo/Other  Morbid obesityBMI 42  Renal/GU negative Renal ROS  negative genitourinary   Musculoskeletal negative musculoskeletal ROS (+)   Abdominal (+) + obese,   Peds  Hematology negative hematology ROS (+)   Anesthesia Other Findings   Reproductive/Obstetrics (+) Pregnancy                             Anesthesia Physical Anesthesia Plan  ASA: III and emergent  Anesthesia Plan: Epidural   Post-op Pain Management:    Induction:   PONV Risk Score and Plan: 2  Airway Management Planned: Natural Airway  Additional Equipment: None  Intra-op Plan:   Post-operative Plan:   Informed Consent: I have reviewed the patients History and Physical, chart, labs and discussed the procedure including the risks, benefits and alternatives for the proposed anesthesia with the patient or authorized representative who has indicated his/her understanding and acceptance.       Plan Discussed with:   Anesthesia Plan Comments:         Anesthesia Quick Evaluation

## 2020-05-29 NOTE — Patient Instructions (Signed)
Labor Induction: You will be called after 6 AM on the day of induction from labor and delivery, their numbers [phone] annually asked to head toward the hospital and be there within the hour Cervix is 2 cm dilated so we should be able to put the Foley bulb inside the cervix which will put pressure on the edges of the cervix and speak to dilation of 4 cm we will then begin Pitocin.  Some people will be given pills to ripen the cervix call Cytotec to ripen the cervix. Labor induction is when steps are taken to cause a pregnant woman to begin the labor process. Most women go into labor on their own between 37 weeks and 42 weeks of pregnancy. When this does not happen or when there is a medical need for labor to begin, steps may be taken to induce labor. Labor induction causes a pregnant woman's uterus to contract. It also causes the cervix to soften (ripen), open (dilate), and thin out (efface). Usually, labor is not induced before 39 weeks of pregnancy unless there is a medical reason to do so. Your health care provider will determine if labor induction is needed. Before inducing labor, your health care provider will consider a number of factors, including:  Your medical condition and your baby's.  How many weeks along you are in your pregnancy.  How mature your baby's lungs are.  The condition of your cervix.  The position of your baby.  The size of your birth canal. What are some reasons for labor induction? Labor may be induced if:  Your health or your baby's health is at risk.  Your pregnancy is overdue by 1 week or more.  Your water breaks but labor does not start on its own.  There is a low amount of amniotic fluid around your baby. You may also choose (elect) to have labor induced at a certain time. Generally, elective labor induction is done no earlier than 39 weeks of pregnancy. What methods are used for labor induction? Methods used for labor induction include:  Prostaglandin  medicine. This medicine starts contractions and causes the cervix to dilate and ripen. It can be taken by mouth (orally) or by being inserted into the vagina (suppository).  Inserting a small, thin tube (catheter) with a balloon into the vagina and then expanding the balloon with water to dilate the cervix.  Stripping the membranes. In this method, your health care provider gently separates amniotic sac tissue from the cervix. This causes the cervix to stretch, which in turn causes the release of a hormone called progesterone. The hormone causes the uterus to contract. This procedure is often done during an office visit, after which you will be sent home to wait for contractions to begin.  Breaking the water. In this method, your health care provider uses a small instrument to make a small hole in the amniotic sac. This eventually causes the amniotic sac to break. Contractions should begin after a few hours.  Medicine to trigger or strengthen contractions. This medicine is given through an IV that is inserted into a vein in your arm. Except for membrane stripping, which can be done in a clinic, labor induction is done in the hospital so that you and your baby can be carefully monitored. How long does it take for labor to be induced? The length of time it takes to induce labor depends on how ready your body is for labor. Some inductions can take up to 2-3 days, while others  may take less than a day. Induction may take longer if:  You are induced early in your pregnancy.  It is your first pregnancy.  Your cervix is not ready. What are some risks associated with labor induction? Some risks associated with labor induction include:  Changes in fetal heart rate, such as being too high, too low, or irregular (erratic).  Failed induction.  Infection in the mother or the baby.  Increased risk of having a cesarean delivery.  Fetal death.  Breaking off (abruption) of the placenta from the uterus  (rare).  Rupture of the uterus (very rare). When induction is needed for medical reasons, the benefits of induction generally outweigh the risks. What are some reasons for not inducing labor? Labor induction should not be done if:  Your baby does not tolerate contractions.  You have had previous surgeries on your uterus, such as a myomectomy, removal of fibroids, or a vertical scar from a previous cesarean delivery.  Your placenta lies very low in your uterus and blocks the opening of the cervix (placenta previa).  Your baby is not in a head-down position.  The umbilical cord drops down into the birth canal in front of the baby.  There are unusual circumstances, such as the baby being very early (premature).  You have had more than 2 previous cesarean deliveries. Summary  Labor induction is when steps are taken to cause a pregnant woman to begin the labor process.  Labor induction causes a pregnant woman's uterus to contract. It also causes the cervix to ripen, dilate, and efface.  Labor is not induced before 39 weeks of pregnancy unless there is a medical reason to do so.  When induction is needed for medical reasons, the benefits of induction generally outweigh the risks. This information is not intended to replace advice given to you by your health care provider. Make sure you discuss any questions you have with your health care provider. Document Revised: 10/22/2017 Document Reviewed: 12/02/2016 Elsevier Patient Education  2020 [employer].

## 2020-05-30 ENCOUNTER — Encounter (HOSPITAL_COMMUNITY): Payer: Self-pay | Admitting: Obstetrics and Gynecology

## 2020-05-30 DIAGNOSIS — Z3A40 40 weeks gestation of pregnancy: Secondary | ICD-10-CM

## 2020-05-30 LAB — ABO/RH: ABO/RH(D): A POS

## 2020-05-30 LAB — RPR: RPR Ser Ql: NONREACTIVE

## 2020-05-30 MED ORDER — DIPHENHYDRAMINE HCL 25 MG PO CAPS: 25.0000 mg | ORAL_CAPSULE | Freq: Four times a day (QID) | ORAL | Status: DC | PRN

## 2020-05-30 MED ORDER — PRENATAL MULTIVITAMIN CH
1.0000 | ORAL_TABLET | Freq: Every day | ORAL | Status: DC
  Administered 2020-05-31 – 2020-06-01 (×2): 1 via ORAL
  Filled 2020-05-30 (×2): qty 1

## 2020-05-30 MED ORDER — POLYETHYLENE GLYCOL 3350 17 G PO PACK
17.0000 g | PACK | Freq: Every day | ORAL | Status: DC
  Administered 2020-05-30 – 2020-06-01 (×3): 17 g via ORAL
  Filled 2020-05-30 (×3): qty 1

## 2020-05-30 MED ORDER — WITCH HAZEL-GLYCERIN EX PADS: 1.0000 "application " | MEDICATED_PAD | CUTANEOUS | Status: DC | PRN

## 2020-05-30 MED ORDER — ACETAMINOPHEN 325 MG PO TABS
650.0000 mg | ORAL_TABLET | ORAL | Status: DC | PRN
  Administered 2020-05-30 – 2020-06-01 (×8): 650 mg via ORAL
  Filled 2020-05-30 (×8): qty 2

## 2020-05-30 MED ORDER — ZOLPIDEM TARTRATE 5 MG PO TABS: 5.0000 mg | ORAL_TABLET | Freq: Every evening | ORAL | Status: DC | PRN

## 2020-05-30 MED ORDER — SIMETHICONE 80 MG PO CHEW: 80.0000 mg | CHEWABLE_TABLET | ORAL | Status: DC | PRN

## 2020-05-30 MED ORDER — DIBUCAINE (PERIANAL) 1 % EX OINT: 1.0000 "application " | TOPICAL_OINTMENT | CUTANEOUS | Status: DC | PRN

## 2020-05-30 MED ORDER — LIDOCAINE HCL (PF) 1 % IJ SOLN
INTRAMUSCULAR | Status: DC | PRN
  Administered 2020-05-30: 2 mL via EPIDURAL
  Administered 2020-05-30: 10 mL via EPIDURAL

## 2020-05-30 MED ORDER — MISOPROSTOL 200 MCG PO TABS
1000.0000 ug | ORAL_TABLET | Freq: Once | ORAL | Status: AC
  Administered 2020-05-30: 1000 ug via RECTAL

## 2020-05-30 MED ORDER — IBUPROFEN 600 MG PO TABS
600.0000 mg | ORAL_TABLET | Freq: Four times a day (QID) | ORAL | Status: DC
  Administered 2020-05-30 – 2020-06-01 (×8): 600 mg via ORAL
  Filled 2020-05-30 (×7): qty 1

## 2020-05-30 MED ORDER — SODIUM CHLORIDE (PF) 0.9 % IJ SOLN
INTRAMUSCULAR | Status: DC | PRN
  Administered 2020-05-30: 12 mL/h via EPIDURAL

## 2020-05-30 MED ORDER — SENNOSIDES-DOCUSATE SODIUM 8.6-50 MG PO TABS
2.0000 | ORAL_TABLET | ORAL | Status: DC
  Administered 2020-05-31 (×2): 2 via ORAL
  Filled 2020-05-30: qty 2

## 2020-05-30 MED ORDER — DOCUSATE SODIUM 100 MG PO CAPS
100.0000 mg | ORAL_CAPSULE | Freq: Two times a day (BID) | ORAL | Status: DC
  Administered 2020-05-31 – 2020-06-01 (×4): 100 mg via ORAL
  Filled 2020-05-30 (×4): qty 1

## 2020-05-30 MED ORDER — TETANUS-DIPHTH-ACELL PERTUSSIS 5-2.5-18.5 LF-MCG/0.5 IM SUSP: 0.5000 mL | Freq: Once | INTRAMUSCULAR | Status: DC

## 2020-05-30 MED ORDER — MEASLES, MUMPS & RUBELLA VAC IJ SOLR: 0.5000 mL | Freq: Once | INTRAMUSCULAR | Status: DC

## 2020-05-30 MED ORDER — ONDANSETRON HCL 4 MG PO TABS: 4.0000 mg | ORAL_TABLET | ORAL | Status: DC | PRN

## 2020-05-30 MED ORDER — ONDANSETRON HCL 4 MG/2ML IJ SOLN: 4.0000 mg | INTRAMUSCULAR | Status: DC | PRN

## 2020-05-30 MED ORDER — COCONUT OIL OIL: 1.0000 "application " | TOPICAL_OIL | Status: DC | PRN

## 2020-05-30 MED ORDER — BENZOCAINE-MENTHOL 20-0.5 % EX AERO
1.0000 "application " | INHALATION_SPRAY | CUTANEOUS | Status: DC | PRN
  Administered 2020-05-30: 1 via TOPICAL
  Filled 2020-05-30 (×2): qty 56

## 2020-05-30 MED ORDER — LACTATED RINGERS IV SOLN: INTRAVENOUS | Status: DC

## 2020-05-30 MED ORDER — MISOPROSTOL 200 MCG PO TABS
ORAL_TABLET | ORAL | Status: AC
  Filled 2020-05-30: qty 5

## 2020-05-30 NOTE — Discharge Summary (Signed)
Postpartum Discharge Summary    Patient Name: Lauren Sherman DOB: 02-12-1996 MRN: [id number]  Date of admission: 05/29/2020 Delivery date:05/30/2020  Delivering provider: Isaias Sakai  Date of discharge: 06/01/2020  Admitting diagnosis: Indication for care in labor or delivery [O75.9] Intrauterine pregnancy: [redacted]w[redacted]d    Secondary diagnosis:  Active Problems:   Indication for care in labor or delivery  Additional problems: None    Discharge diagnosis: Term Pregnancy Delivered and 3C Perineal Laceration                                              Post partum procedures:None Augmentation: N/A Complications: None  Hospital course: Onset of Labor With Vaginal Delivery      24y.o. yo G1P1001 at 453w3das admitted in Active Labor on 05/29/2020. Patient had an uncomplicated labor course as follows:  Membrane Rupture Time/Date: 6:26 AM ,05/30/2020   Delivery Method:Vaginal, Spontaneous  Episiotomy: None  Lacerations:  3rd degree , 3C Perineal Lac Patient had an uncomplicated postpartum course.  She is ambulating, tolerating a regular diet, passing flatus, and urinating well. Patient is discharged home in stable condition on 06/01/20.  Newborn Data: Birth date:05/30/2020  Birth time:9:17 AM  Gender:Female  Living status:Living  Apgars:9 ,9  Weight:4190 g   Magnesium Sulfate received: No BMZ received: No Rhophylac:N/A MMR:N/A T-DaP:Given prenatally Flu: N/A Transfusion:No  Physical exam  Vitals:   05/31/20 0406 05/31/20 1500 05/31/20 2115 06/01/20 0500  BP: 123/65 116/69 122/68 (!) 113/56  Pulse: 71 81 77 64  Resp: '16 17 16 18  ' Temp: 97.7 F (36.5 C) 97.8 F (36.6 C) 98.3 F (36.8 C) 98 F (36.7 C)  TempSrc: Oral Oral Oral Oral  SpO2: 100%  100%   Weight:      Height:       General: alert, cooperative and no distress Lochia: appropriate Uterine Fundus: firm and at level of umbilicus Incision: vaginal exam deferred DVT Evaluation: No evidence of DVT seen on  physical exam. No cords or calf tenderness. No significant calf/ankle edema. Labs: Lab Results  Component Value Date   WBC 9.3 05/29/2020   HGB 11.1 (L) 05/29/2020   HCT 33.7 (L) 05/29/2020   MCV 94.9 05/29/2020   PLT 222 05/29/2020   CMP Latest Ref Rng & Units 07/04/2017  Glucose 65 - 99 mg/dL 78  BUN 6 - 20 mg/dL 10  Creatinine 0.44 - 1.00 mg/dL 0.80  Sodium 135 - 145 mmol/L 142  Potassium 3.5 - 5.1 mmol/L 3.7  Chloride 101 - 111 mmol/L 109  CO2 22 - 32 mmol/L -  Calcium 8.9 - 10.3 mg/dL -  Total Protein 6.5 - 8.1 g/dL -  Total Bilirubin 0.3 - 1.2 mg/dL -  Alkaline Phos 38 - 126 U/L -  AST 15 - 41 U/L -  ALT 14 - 54 U/L -   Edinburgh Score: Edinburgh Postnatal Depression Scale Screening Tool 05/31/2020  I have been able to laugh and see the funny side of things. 0  I have looked forward with enjoyment to things. 0  I have blamed myself unnecessarily when things went wrong. 1  I have been anxious or worried for no good reason. 2  I have felt scared or panicky for no good reason. 0  Things have been getting on top of me. 0  I have been so unhappy that I  have had difficulty sleeping. 0  I have felt sad or miserable. 0  I have been so unhappy that I have been crying. 0  The thought of harming myself has occurred to me. 0  Edinburgh Postnatal Depression Scale Total 3     After visit meds:  Allergies as of 06/01/2020      Reactions   Cherry Itching, Other (See Comments)   Burning   Peanut-containing Drug Products Itching, Rash      Medication List    TAKE these medications   acetaminophen 325 MG tablet Commonly known as: Tylenol Take 2 tablets (650 mg total) by mouth every 6 (six) hours.   coconut oil Oil Apply 1 application topically as needed.   docusate sodium 100 MG capsule Commonly known as: COLACE Take 1 capsule (100 mg total) by mouth 2 (two) times daily.   ibuprofen 600 MG tablet Commonly known as: ADVIL Take 1 tablet (600 mg total) by mouth every 8  (eight) hours. Take with food and drink.   norethindrone 0.35 MG tablet Commonly known as: Ortho Micronor Take 1 tablet (0.35 mg total) by mouth daily.   oxycodone 5 MG capsule Commonly known as: OXY-IR Take 1 capsule (5 mg total) by mouth every 6 (six) hours as needed for up to 3 days (breakthrough pain despite tylenol and ibuprofen).   polyethylene glycol 17 g packet Commonly known as: MIRALAX / GLYCOLAX Take 17 g by mouth daily as needed for moderate constipation.   PRENATAL VITAMINS PO Take by mouth. gummies-2 daily        Discharge home in stable condition Infant Feeding: Breast Infant Disposition:home with mother Discharge instruction: per After Visit Summary and Postpartum booklet. Activity: Advance as tolerated. Pelvic rest for 6 weeks.  Diet: routine diet Future Appointments: Future Appointments  Date Time Provider [place]  06/05/2020  1:50 PM Roma Schanz, CNM [facility]  07/01/2020  9:30 AM Roma Schanz, CNM [facility]   Follow up Visit: Please schedule this patient for a In person postpartum visit in 2 weeks for perineal laceration check with the following provider: MD. Additional Postpartum F/U:NONE  Low risk pregnancy complicated by: 3rd degree, 3C, perineal laceration Delivery mode:  Vaginal, Spontaneous  Anticipated Birth Control: POPs to Nexplanon outpatient  06/01/2020 Randa Ngo, MD

## 2020-05-30 NOTE — Progress Notes (Signed)
Labor Progress Note SINCERE BERLANGA is a 24 y.o. G1P0000 at [redacted]w[redacted]d presented for SOL.  S: Sleeping comfortably    O:  BP 103/71   Pulse 87   Temp 97.9 F (36.6 C) (Oral)   Resp 16   Ht 5\' 3"  (1.6 m)   LMP 08/21/2019 (Exact Date)   SpO2 95%   BMI 41.56 kg/m  EFM: FHR 130 bpm, Moderate variability, + Accels, - decels Toco: every 3-4 mins  CVE: Dilation: 6.5 Effacement (%): 100 Cervical Position: Middle Station: -2 Presentation: Vertex Exam by:: C Cornetto RN   A&P: Kelsye Loomer is a 24 y.o. G1P0000 [redacted]w[redacted]d presented with SOL #Labor: Progressing well. Expectant management. Consider Pitocin if needed. #Pain: Per patient request #FWB: Category I #GBS negative  Anticipate vaginal delivery.  [redacted]w[redacted]d, DO 3:15 AM

## 2020-05-30 NOTE — Anesthesia Procedure Notes (Signed)
Epidural Patient location during procedure: OB Start time: 05/29/2020 11:57 PM End time: 05/30/2020 12:07 AM  Staffing Anesthesiologist: Lannie Fields, DO Performed: anesthesiologist   Preanesthetic Checklist Completed: patient identified, IV checked, risks and benefits discussed, monitors and equipment checked, pre-op evaluation and timeout performed  Epidural Patient position: sitting Prep: DuraPrep and site prepped and draped Patient monitoring: continuous pulse ox, blood pressure, heart rate and cardiac monitor Approach: midline Location: L3-L4 Injection technique: LOR air  Needle:  Needle type: Tuohy  Needle gauge: 17 G Needle length: 9 cm Needle insertion depth: 8.5 cm Catheter type: closed end flexible Catheter size: 19 Gauge Catheter at skin depth: 15 cm Test dose: negative  Assessment Sensory level: T8 Events: blood not aspirated, injection not painful, no injection resistance, no paresthesia and negative IV test  Additional Notes Patient identified. Risks/Benefits/Options discussed with patient including but not limited to bleeding, infection, nerve damage, paralysis, failed block, incomplete pain control, headache, blood pressure changes, nausea, vomiting, reactions to medication both or allergic, itching and postpartum back pain. Confirmed with bedside nurse the patient's most recent platelet count. Confirmed with patient that they are not currently taking any anticoagulation, have any bleeding history or any family history of bleeding disorders. Patient expressed understanding and wished to proceed. All questions were answered. Sterile technique was used throughout the entire procedure. Please see nursing notes for vital signs. Test dose was given through epidural catheter and negative prior to continuing to dose epidural or start infusion. Warning signs of high block given to the patient including shortness of breath, tingling/numbness in hands, complete motor  block, or any concerning symptoms with instructions to call for help. Patient was given instructions on fall risk and not to get out of bed. All questions and concerns addressed with instructions to call with any issues or inadequate analgesia.  Reason for block:procedure for pain

## 2020-05-30 NOTE — Lactation Note (Signed)
This note was copied from a baby's chart. Lactation Consultation Note  Patient Name: Lauren Sherman [id number] Date: 05/30/2020 Reason for consult: Initial assessment;1st time breastfeeding;Other (Comment) (LGA infant greater than 9 llbs.) P1, 9 hour term LGA female infant. Per mom, infant latched well in L&D and BF in room 2nd time for 15 minutes. Tools given: Breast shells and hand pump to help evert flat nipple for infant to latch at breast. LC asked mom pre-pump with hand pump prior to latching infant on her left breast using the football hold position, infant latched but only held breast in mouth. Mom hand expressed and infant was given 6 mls of colostrum by spoon. Mom did STS with infant afterwards. Mom will pre-pump breast prior to latching infant with hand pump and wear breast shells in her bra during the day, she will not sleep with breast shells on during the day nor the night. Mom will continue to work towards infant latching at breast and mom knows to call RN or LC if she needs assistance with latching infant at breast.  Mom knows how to hand express and give infant back volume if he doesn't latch to the breast within the first 24 hours of life. Mom knows to BF according to hunger cues, on demand and 8 to 12+ times within 24 hours. Reviewed Baby & Me book's Breastfeeding Basics.  Mom made aware of O/P services, breastfeeding support groups, community resources, and our phone # for post-discharge questions.   Maternal Data Formula Feeding for Exclusion: No Does the patient have breastfeeding experience prior to this delivery?: No  Feeding Feeding Type: Breast Fed  LATCH Score Latch: Too sleepy or reluctant, no latch achieved, no sucking elicited.  Audible Swallowing: None  Type of Nipple: Flat  Comfort (Breast/Nipple): Soft / non-tender  Hold (Positioning): Assistance needed to correctly position infant at breast and maintain latch.  LATCH Score:  4  Interventions Interventions: Breast feeding basics reviewed;Assisted with latch;Breast compression;Shells;Adjust position;Skin to skin;Breast massage;Support pillows;Hand express;Position options;Pre-pump if needed;Expressed milk  Lactation Tools Discussed/Used Tools: Shells Shell Type: Other (comment) (flat) WIC Program: Yes Pump Review: Setup, frequency, and cleaning;Milk Storage Initiated by:: Danelle Earthly, IBCLC Date initiated:: 05/30/20   Consult Status Consult Status: Follow-up Date: 05/31/20 Follow-up type: In-patient    Danelle Earthly 05/30/2020, 6:31 PM

## 2020-05-30 NOTE — Anesthesia Postprocedure Evaluation (Signed)
Anesthesia Post Note  Patient: MALLEY HAUTER  Procedure(s) Performed: AN AD HOC LABOR EPIDURAL     Patient location during evaluation: Mother Baby Anesthesia Type: Epidural Level of consciousness: awake and alert Pain management: pain level controlled Vital Signs Assessment: post-procedure vital signs reviewed and stable Respiratory status: spontaneous breathing, nonlabored ventilation and respiratory function stable Cardiovascular status: stable Postop Assessment: no headache, no backache and epidural receding Anesthetic complications: no   No complications documented.  Last Vitals:  Vitals:   05/30/20 0800 05/30/20 0830  BP: 119/78 118/76  Pulse: 86 97  Resp: 16 14  Temp:    SpO2:      Last Pain:  Vitals:   05/30/20 0830  TempSrc:   PainSc: 0-No pain   Pain Goal: Patients Stated Pain Goal: 0 (05/29/20 2159)                 Johanny Segers

## 2020-05-30 NOTE — Progress Notes (Signed)
Labor Progress Note PERI KREFT is a 24 y.o. G1P0000 at [redacted]w[redacted]d presented for SOL.  S: Comfortable with epidural    O:  BP (!) 103/51    Pulse 82    Temp 97.9 F (36.6 C) (Oral)    Resp 17    Ht 5\' 3"  (1.6 m)    LMP 08/21/2019 (Exact Date)    SpO2 96%    BMI 41.56 kg/m  EFM: FHR 125 bpm, Moderate variability, + Accels, - decels Toco: every 2-3 mins  CVE: Dilation: 10 Dilation Complete Date: 05/30/20 Dilation Complete Time: 0625 Effacement (%): 100 Cervical Position: Middle Station: Plus 1 Presentation: Vertex Exam by:: Dr. [email or web address]   A&P: Saraann Enneking is a 24 y.o. G1P0000 [redacted]w[redacted]d presented with SOL #Labor: Progressing well. Expectant management.  #Pain: Per patient request #FWB: Category I #GBS negative  Anticipate vaginal delivery.  [redacted]w[redacted]d, DO 7:33 AM

## 2020-05-31 MED ORDER — WHITE PETROLATUM EX OINT: 1.0000 "application " | TOPICAL_OINTMENT | CUTANEOUS | Status: DC | PRN

## 2020-05-31 MED ORDER — LIDOCAINE 1% INJECTION FOR CIRCUMCISION
0.8000 mL | INJECTION | Freq: Once | INTRAVENOUS | Status: DC
  Filled 2020-05-31 (×2): qty 1

## 2020-05-31 MED ORDER — EPINEPHRINE TOPICAL FOR CIRCUMCISION 0.1 MG/ML: 1.0000 [drp] | TOPICAL | Status: DC | PRN

## 2020-05-31 MED ORDER — ACETAMINOPHEN FOR CIRCUMCISION 160 MG/5 ML
40.0000 mg | Freq: Once | ORAL | Status: DC
  Filled 2020-05-31: qty 1.25

## 2020-05-31 MED ORDER — ACETAMINOPHEN FOR CIRCUMCISION 160 MG/5 ML: 40.0000 mg | ORAL | Status: DC | PRN

## 2020-05-31 MED ORDER — SUCROSE 24% NICU/PEDS ORAL SOLUTION: 0.5000 mL | OROMUCOSAL | Status: DC | PRN

## 2020-05-31 NOTE — Lactation Note (Signed)
This note was copied from a baby's chart. Lactation Consultation Note  Patient Name: Lauren Sherman [id number] Date: 05/31/2020 Reason for consult: Follow-up assessment;Term 38 36hrs old, mom asleep in bed, dad sitting on couch bottle feeding baby. Reports baby consumed ~58mls. Advised no LC overnight, will let mom rest and LC will f/u in the AM. BGilliam, RN, IBCLC  Maternal Data    Feeding    LATCH Score                   Interventions    Lactation Tools Discussed/Used     Consult Status Consult Status: Follow-up Date: 06/01/20 Follow-up type: In-patient    Charlynn Court 05/31/2020, 9:39 PM

## 2020-05-31 NOTE — Progress Notes (Signed)
Post Partum Day 1 Subjective: no complaints, up ad lib, voiding and tolerating PO, small lochia, plans to breastfeed, oral progesterone-only contraceptive  Objective: Blood pressure 123/65, pulse 71, temperature 97.7 F (36.5 C), temperature source Oral, resp. rate 16, height 5\' 3"  (1.6 m), weight (!) 106.4 kg, last menstrual period 08/21/2019, SpO2 100 %, unknown if currently breastfeeding.  Physical Exam:  General: alert, cooperative and no distress Lochia:normal flow Chest: CTAB Heart: RRR no m/r/g Abdomen: +BS, soft, nontender,  Uterine Fundus: firm DVT Evaluation: No evidence of DVT seen on physical exam. Extremities: trace edema  Recent Labs    05/29/20 2235  HGB 11.1*  HCT 33.7*    Assessment/Plan: Plan for discharge tomorrow and Lactation consult   LOS: 2 days   05/31/20 05/31/2020, 7:57 AM

## 2020-05-31 NOTE — Clinical Social Work Maternal (Signed)
CLINICAL SOCIAL WORK MATERNAL/CHILD NOTE  Patient Details  Name: Lauren Sherman MRN: [id number] Date of Birth: 01/31/1996  Date:  December 14, 2019  Clinical Social Worker Initiating Note:  Lauren Pilar, LCSW Date/Time: Initiated:  2020/01/20/0910     Child'Lauren Name:  Lauren Sherman   Biological Parents:  Mother, Father Lauren, Sherman)   Need for Interpreter:  None   Reason for Referral:  Current Substance Use/Substance Use During Pregnancy    Address:  [address] [place] [address]    Phone number:  [phone] (home)     Additional phone number: none   Household Members/Support Persons (HM/SP):   Household Member/Support Person 1   HM/SP Name Relationship DOB or Age  HM/SP -1 Lauren Sherman  [facility]   01/22/1996  HM/SP -2        HM/SP -3        HM/SP -4        HM/SP -5        HM/SP -6        HM/SP -7        HM/SP -8          Natural Supports (not living in the home):  Parent   Professional Supports: None   Employment: Student   Type of Work:  Consulting civil engineer. MOB also reports that'Lauren she is on leave from [employer].   Education:  Attending college   Homebound arranged:    Financial Resources:  Media planner (BCBS)   Other Resources:  [facility]   Cultural/Religious Considerations Which May Impact Care:  none reported.   Strengths:  Ability to meet basic needs , Compliance with medical plan , Home prepared for child , Psychotropic Medications, Pediatrician chosen   Psychotropic Medications:     None     Pediatrician:    [facility]  Pediatrician List:   [facility] Other [facility]  [facility]      Pediatrician Fax Number:    Risk Factors/Current Problems:  None   Cognitive State:  Insightful , Able to Concentrate , Alert    Mood/Affect:  Happy , Interested , Relaxed , Comfortable , Calm    CSW Assessment: CSW consulted as MOB had THC use earlier in  pregnancy. CSW went to speak with  MOB at bedside to address further needs.   CSW congratulated MOB on the birth of infant. CSW advised MOB of the HIPPA policy as CSW noted that MOB had a guest in the room. MOB reported that this was her mother and that it was okay for her to remain in the room while CSW spoke with her. CSW understanding and advised MOB of CSW'Lauren role and the reason for CSW coming to visit with her. MOB reported that she was a smoker prior to pregnancy, however "I found out that I was pregnant at 2 months and then I stopped". MOB reported that last recall using THC around December 2020 as "this is when I had my first visit". CSW understanding and advised MOB of the hospital drug screen policy. MOB was advised of both UDS and CDS drug screens and if either is positive then CSW would need to make a CPS report. MOB reported that she understood and  MOB reported that she had no THC use of any other substance use after confirmation of her pregnancy. MOB did report that she drank a glass of red  wine sometimes during her pregnancy but reported that his was very rare. CSW Understanding of this and reported to MOB that drug screen doesn't test for this. MOB reported that she understood.   CSW inquired from MOB on her mental health hx. MOB reported that she has no mental health hx and reported that she has been feeling well since she gave birth. MOB denies SI and HI and reported no DV. MOB reported that her supports are FOB. MOB reported that she has all needed items to care for infant with basinet at home for infant to sleep in and follow up care at [facility].   CSW took time to provide MOB with PPD and SIDS education. MOB Was given PPD Checklist in order to keep track of feelings as they relate to PPD. MOB reported that she thinks that she dealt with depression during her pregnancy but also associated it with "hormomes". MOB reported that depression decreased around May 2021 with no signs or  symptoms since.   CSW will continue to monitor infants CDS and UDS and make CPS report of warranted. At this time there are no barriers to infant discharging.   CSW Plan/Description:  No Further Intervention Required/No Barriers to Discharge, Sudden Infant Death Syndrome (SIDS) Education, Perinatal Mood and Anxiety Disorder (PMADs) Education, CSW Will Continue to Monitor Umbilical Cord Tissue Drug Screen Results and Make Report if Warranted, Hospital Drug Screen Policy Information    Lauren Sherman Lauren Sherman, LCSWA 05/31/2020, 9:34 AM  

## 2020-06-01 MED ORDER — ACETAMINOPHEN 325 MG PO TABS: 650.0000 mg | ORAL_TABLET | Freq: Four times a day (QID) | ORAL | Status: DC

## 2020-06-01 MED ORDER — DOCUSATE SODIUM 100 MG PO CAPS: 100.0000 mg | ORAL_CAPSULE | Freq: Two times a day (BID) | ORAL | 0 refills | Status: DC

## 2020-06-01 MED ORDER — OXYCODONE HCL 5 MG PO CAPS: 5.0000 mg | ORAL_CAPSULE | Freq: Four times a day (QID) | ORAL | 0 refills | Status: AC | PRN

## 2020-06-01 MED ORDER — POLYETHYLENE GLYCOL 3350 17 G PO PACK: 17.0000 g | PACK | Freq: Every day | ORAL | 0 refills | Status: DC | PRN

## 2020-06-01 MED ORDER — COCONUT OIL OIL: 1.0000 "application " | TOPICAL_OIL | 0 refills | Status: DC | PRN

## 2020-06-01 MED ORDER — IBUPROFEN 600 MG PO TABS: 600.0000 mg | ORAL_TABLET | Freq: Three times a day (TID) | ORAL | 0 refills | Status: DC

## 2020-06-01 MED ORDER — NORETHINDRONE 0.35 MG PO TABS
1.0000 | ORAL_TABLET | Freq: Every day | ORAL | 1 refills | Status: DC
Start: 2020-06-01 — End: 2020-06-20

## 2020-06-01 NOTE — Lactation Note (Signed)
This note was copied from a baby's chart. Lactation Consultation Note  Patient Name: Lauren Sherman XIPJA'S Date: 06/01/2020 Reason for consult: Follow-up assessment;1st time breastfeeding;Primapara;Term  Follow up with 26 hours old baby Lauren with 2.03% weight loss of a P1 mother. Mother reports she has breastfed twice in the las 24 hours and supplemented with formula. Mother explained she was experiencing a lot of pain after her epidural wore off and she was unable to breastfeed. Mother states her goal is to breast and formula feed this baby. Mother stated she has a personal DEBP that she plans to use at home.  Discussed supplementation guidelines, stomach size and paced bottle feeding to avoid issues. Encouraged frequent burping and sit up position. Encourage to follow hunger cues to feed baby and fullness signs when supplementing to prevent overfeeding.  Shared pump use for proper stimulation and to establish good milk supply. Provided education regarding frequency, cleaning and milk storage per mother's request and more educational resources. Encouraged to pump every time she uses formula and feed baby everything she pumps.   Reviewed breastfeeding basics. Discussed milk coming to volume. Reviewed NBN behavior and expectations. All questions answered at this time.    Maternal Data Does the patient have breastfeeding experience prior to this delivery?: No  Lactation Tools Discussed/Used Pump Review: Milk Storage;Setup, frequency, and cleaning (Information provided per mother's inquiry. No pump use at this admission.)   Consult Status Consult Status: Complete Date: 06/01/20 Follow-up type: Call as needed    Lauren Sherman 06/01/2020, 2:31 PM

## 2020-06-01 NOTE — Discharge Instructions (Signed)
Postpartum Care After Vaginal Delivery This sheet gives you information about how to care for yourself from the time you deliver your baby to up to 6-12 weeks after delivery (postpartum period). Your health care provider may also give you more specific instructions. If you have problems or questions, contact your health care provider. Follow these instructions at home: Vaginal bleeding  It is normal to have vaginal bleeding (lochia) after delivery. Wear a sanitary pad for vaginal bleeding and discharge. ? During the first week after delivery, the amount and appearance of lochia is often similar to a menstrual period. ? Over the next few weeks, it will gradually decrease to a dry, yellow-brown discharge. ? For most women, lochia stops completely by 4-6 weeks after delivery. Vaginal bleeding can vary from woman to woman.  Change your sanitary pads frequently. Watch for any changes in your flow, such as: ? A sudden increase in volume. ? A change in color. ? Large blood clots.  If you pass a blood clot from your vagina, save it and call your health care provider to discuss. Do not flush blood clots down the toilet before talking with your health care provider.  Do not use tampons or douches until your health care provider says this is safe.  If you are not breastfeeding, your period should return 6-8 weeks after delivery. If you are feeding your child breast milk only (exclusive breastfeeding), your period may not return until you stop breastfeeding. Perineal care  Keep the area between the vagina and the anus (perineum) clean and dry as told by your health care provider. Use medicated pads and pain-relieving sprays and creams as directed.  If you had a cut in the perineum (episiotomy) or a tear in the vagina, check the area for signs of infection until you are healed. Check for: ? More redness, swelling, or pain. ? Fluid or blood coming from the cut or tear. ? Warmth. ? Pus or a bad  smell.  You may be given a squirt bottle to use instead of wiping to clean the perineum area after you go to the bathroom. As you start healing, you may use the squirt bottle before wiping yourself. Make sure to wipe gently.  To relieve pain caused by an episiotomy, a tear in the vagina, or swollen veins in the anus (hemorrhoids), try taking a warm sitz bath 2-3 times a day. A sitz bath is a warm water bath that is taken while you are sitting down. The water should only come up to your hips and should cover your buttocks. Breast care  Within the first few days after delivery, your breasts may feel heavy, full, and uncomfortable (breast engorgement). Milk may also leak from your breasts. Your health care provider can suggest ways to help relieve the discomfort. Breast engorgement should go away within a few days.  If you are breastfeeding: ? Wear a bra that supports your breasts and fits you well. ? Keep your nipples clean and dry. Apply creams and ointments as told by your health care provider. ? You may need to use breast pads to absorb milk that leaks from your breasts. ? You may have uterine contractions every time you breastfeed for up to several weeks after delivery. Uterine contractions help your uterus return to its normal size. ? If you have any problems with breastfeeding, work with your health care provider or lactation consultant.  If you are not breastfeeding: ? Avoid touching your breasts a lot. Doing this can make   your breasts produce more milk. ? Wear a good-fitting bra and use cold packs to help with swelling. ? Do not squeeze out (express) milk. This causes you to make more milk. Intimacy and sexuality  Ask your health care provider when you can engage in sexual activity. This may depend on: ? Your risk of infection. ? How fast you are healing. ? Your comfort and desire to engage in sexual activity.  You are able to get pregnant after delivery, even if you have not had  your period. If desired, talk with your health care provider about methods of birth control (contraception). Medicines  Take over-the-counter and prescription medicines only as told by your health care provider.  If you were prescribed an antibiotic medicine, take it as told by your health care provider. Do not stop taking the antibiotic even if you start to feel better. Activity  Gradually return to your normal activities as told by your health care provider. Ask your health care provider what activities are safe for you.  Rest as much as possible. Try to rest or take a nap while your baby is sleeping. Eating and drinking   Drink enough fluid to keep your urine pale yellow.  Eat high-fiber foods every day. These may help prevent or relieve constipation. High-fiber foods include: ? Whole grain cereals and breads. ? Brown rice. ? Beans. ? Fresh fruits and vegetables.  Do not try to lose weight quickly by cutting back on calories.  Take your prenatal vitamins until your postpartum checkup or until your health care provider tells you it is okay to stop. Lifestyle  Do not use any products that contain nicotine or tobacco, such as cigarettes and e-cigarettes. If you need help quitting, ask your health care provider.  Do not drink alcohol, especially if you are breastfeeding. General instructions  Keep all follow-up visits for you and your baby as told by your health care provider. Most women visit their health care provider for a postpartum checkup within the first 3-6 weeks after delivery. Contact a health care provider if:  You feel unable to cope with the changes that your child brings to your life, and these feelings do not go away.  You feel unusually sad or worried.  Your breasts become red, painful, or hard.  You have a fever.  You have trouble holding urine or keeping urine from leaking.  You have little or no interest in activities you used to enjoy.  You have not  breastfed at all and you have not had a menstrual period for 12 weeks after delivery.  You have stopped breastfeeding and you have not had a menstrual period for 12 weeks after you stopped breastfeeding.  You have questions about caring for yourself or your baby.  You pass a blood clot from your vagina. Get help right away if:  You have chest pain.  You have difficulty breathing.  You have sudden, severe leg pain.  You have severe pain or cramping in your lower abdomen.  You bleed from your vagina so much that you fill more than one sanitary pad in one hour. Bleeding should not be heavier than your heaviest period.  You develop a severe headache.  You faint.  You have blurred vision or spots in your vision.  You have bad-smelling vaginal discharge.  You have thoughts about hurting yourself or your baby. If you ever feel like you may hurt yourself or others, or have thoughts about taking your own life, get help   right away. You can go to the nearest emergency department or call:  Your local emergency services (911 in the [place]  A suicide crisis helpline, such as the National Suicide Prevention Lifeline at 1-800-273-8255. This is open 24 hours a day. Summary  The period of time right after you deliver your newborn up to 6-12 weeks after delivery is called the postpartum period.  Gradually return to your normal activities as told by your health care provider.  Keep all follow-up visits for you and your baby as told by your health care provider. This information is not intended to replace advice given to you by your health care provider. Make sure you discuss any questions you have with your health care provider. Document Revised: 10/22/2017 Document Reviewed: 08/02/2017 Elsevier Patient Education  2020 [employer]   Care of a Perineal Tear A perineal tear is a cut or tear (laceration) in the tissue between the opening of the vagina and the anus (perineum). Some women  develop a perineal tear during a vaginal birth. This can happen as the baby emerges from the birth canal and the perineum is stretched. There are four degrees of perineal tears based on how deep and long the laceration is:  First degree. This involves a shallow tear at the edge of the vaginal opening that extends slightly into the perineal skin.  Second degree. This involves tearing described in first degree perineal tear, and an additional deeper tear of the vaginal opening and perineal tissues. It may also include tearing of a muscle just under the perineal skin.  Third degree. This involves tearing described in first and second degree perineal tears, with the addition that tearing in the third degree extends into the muscle of the anus (anal sphincter).  Fourth degree. This involves all levels of tears described in first, second, and third degree perineal tears, with the tear in the fourth degree extending into the rectum. First and second degree perineal tears may or may not be stitched closed, depending on their location and appearance. Third and fourth degree perineal tears are stitched closed immediately after the baby's birth. What are the risks? Depending on the type of perineal tear you have, you may be at risk for:  Bleeding.  Developing a collection of blood in the perineal tear area (hematoma).  Pain. This may include pain when you urinate, or pain when you have a bowel movement.  Infection at the site of the tear.  Fever.  Trouble controlling your urination or bowels (incontinence).  Painful sex. How to care for a perineal tear Wound care  Take a sitz bath as told by your health care provider. A sitz bath is a warm water bath that is taken while you are sitting down. The water should only come up to your hips and should cover your buttocks. This can speed up healing. 1. Partially fill a bathtub with warm water. You will only need the water to be deep enough to cover your  hips and buttocks when you are sitting in it. 2. If your health care provider told you to put medicine in the water, follow the directions exactly as told. 3. Sit in the water and open the tub drain a little. 4. Turn on the warm water again to keep the tub at the correct level. Keep the water running constantly. 5. Soak in the water for 15-20 minutes or as told by your health care provider. 6. After the sitz bath, pat the affected area dry first.   Do not rub it. 7. Be careful when you stand up after the sitz bath because you may feel dizzy.  Wash your hands before and after applying medicine to the area.  Wear a sanitary pad as told by your health care provider. Change the pad as often as told by your health care provider.  Leave stitches (sutures), skin glue, or adhesive strips in place. These skin closures may need to stay in place for 2 weeks or longer. If adhesive strip edges start to loosen and curl up, you may trim the loose edges. Do not remove adhesive strips completely unless your health care provider tells you to do that.  Check your wound every day for signs of infection. Check for: ? Redness, swelling, or pain. ? Fluid or blood. ? Warmth. ? Pus or a bad smell. Managing pain  If directed, put ice on the painful area: ? Put ice in a plastic bag. ? Place a towel between your skin and the bag. ? Leave the ice on for 20 minutes, 2-3 times a day.  Apply a numbing spray to the perineal tear site as told by your health care provider. This may help with discomfort.  Take and apply over-the-counter and prescription medicines only as told by your health care provider.  If told, put about 3 witch hazel-containing hemorrhoid treatment pads on top of your sanitary pad. The witch hazel in the hemorrhoid pads helps with swelling and discomfort.  Sit on an inflatable ring or pillow. This may provide comfort. General instructions  Squeeze warm water on your perineum after urinating. This  should be done from front to back with a squeeze bottle. Pat the area to dry it.  Do not have sex, use tampons, or place anything in your vagina for at least 6 weeks or as told by your health care provider.  Keep all follow-up visits as told by your health care provider. These include any postpartum visits. This is important. Contact a health care provider if:  Your pain is not relieved with medicines.  You have painful urination.  You have redness, swelling, or pain around your tear.  You have fluid or blood coming from your tear.  Your tear feels warm to the touch.  You have pus or a bad smell coming from your tear.  You have a fever. Get help right away if:  Your tear opens.  You cannot urinate.  You have an increase in bleeding.  You have severe pain. Summary  A perineal tear is a cut or tear (laceration) in the tissue between the opening of the vagina and the anus (perineum).  There are four degrees of perineal tears based on how deep and long the laceration is.  First and second-degree perineal tears may or may not be stitched closed, depending on their location and appearance. Third and fourth- degree perineal tears are stitched closed immediately after the baby's birth.  Follow your health care provider's instructions for caring for your perineal tear. Know how to manage pain and how to care for your wound. Know when to call your health care provider and when to seek immediate emergency care. This information is not intended to replace advice given to you by your health care provider. Make sure you discuss any questions you have with your health care provider. Document Revised: 10/01/2017 Document Reviewed: 11/23/2016 Elsevier Patient Education  2020 Else[employer] Inc.   

## 2020-06-03 ENCOUNTER — Inpatient Hospital Stay (HOSPITAL_COMMUNITY): Payer: BC Managed Care – PPO

## 2020-06-03 ENCOUNTER — Inpatient Hospital Stay (HOSPITAL_COMMUNITY): Admission: AD | Admit: 2020-06-03 | Payer: BC Managed Care – PPO | Source: Home / Self Care | Admitting: Family Medicine

## 2020-06-04 ENCOUNTER — Telehealth: Payer: Self-pay | Admitting: Women's Health

## 2020-06-04 NOTE — Telephone Encounter (Signed)
Attempted to call patient back and left vm that I am returning her call.  

## 2020-06-04 NOTE — Telephone Encounter (Signed)
Patient would like a call back but did not state the reason for the call. 

## 2020-06-04 NOTE — Telephone Encounter (Signed)
Patient returned call. Patient states had some lower right abdominal pain this morning that lasted 10 to minutes. Patient took her prescribed pain meds and used a heating pad. Patient states no other symptoms. Advised patient if happened again or pain gets worse to call or go to ER. Patient voiced understanding.

## 2020-06-05 ENCOUNTER — Ambulatory Visit (INDEPENDENT_AMBULATORY_CARE_PROVIDER_SITE_OTHER): Payer: BC Managed Care – PPO | Admitting: Women's Health

## 2020-06-05 ENCOUNTER — Encounter: Payer: Self-pay | Admitting: Women's Health

## 2020-06-05 NOTE — Progress Notes (Signed)
   GYN VISIT Patient name: Lauren Sherman MRN [id number]  Date of birth: March 01, 1996 Chief Complaint:   Routine Post Op  History of Present Illness:   Lauren Sherman is a 24 y.o. G34P1001 African American female 6d s/p SVB of 9lb3.8oz baby w/ 3rd degree lac being seen today for f/u on perineal laceration. Taking oxycodone and alternating apap/ibuprofen, still just sore. Taking stool softener, having normal bm's w/o straining. Breast and bottlefeeding. Denies PPD.     Depression screen [facility] 2/9 02/28/2020 11/13/2019 12/07/2017 08/11/2016  Decreased Interest 0 1 0 0  Down, Depressed, Hopeless 0 1 0 0  PHQ - 2 Score 0 2 0 0  Altered sleeping 0 1 - -  Tired, decreased energy 0 1 - -  Change in appetite 0 0 - -  Feeling bad or failure about yourself  0 0 - -  Trouble concentrating 0 0 - -  Moving slowly or fidgety/restless 0 0 - -  Suicidal thoughts 0 0 - -  PHQ-9 Score 0 4 - -    No LMP recorded. Review of Systems:   Pertinent items are noted in HPI Denies fever/chills, dizziness, headaches, visual disturbances, fatigue, shortness of breath, chest pain, abdominal pain, vomiting, abnormal vaginal discharge/itching/odor/irritation, problems with periods, bowel movements, urination, or intercourse unless otherwise stated above.  Pertinent History Reviewed:  Reviewed past medical,surgical, social, obstetrical and family history.  Reviewed problem list, medications and allergies. Physical Assessment:   Vitals:   06/05/20 1402  BP: 129/83  Pulse: 89  There is no height or weight on file to calculate BMI.       Physical Examination:   General appearance: alert, well appearing, and in no distress  Mental status: alert, oriented to person, place, and time  Skin: warm & dry   Cardiovascular: normal heart rate noted  Respiratory: normal respiratory effort, no distress  Abdomen: soft, non-tender   Pelvic: vaginal lac well approximated and healing well, Lt perineal extension to rectum mainly well  approximated, ~1cm opening w/ exam, but approximates when tissues are not manipulated  Extremities: no edema   Chaperone: Amanda Rash    No results found for this or any previous visit (from the past 24 hour(s)).  Assessment & Plan:  1) 6d s/p SVB w/ 3rd degree lac> overall healing well, ~1cm opening Lt perineal area, f/u 1wk w/ MD (no MD available today) to make sure continues to heal well. Keep taking colace, using sitz baths  Meds: No orders of the defined types were placed in this encounter.   No orders of the defined types were placed in this encounter.   Return in about 1 week (around 06/12/2020) for f/u w/ JVF in person.  Cheral Marker CNM, [place] 06/05/2020 2:21 PM

## 2020-06-06 ENCOUNTER — Encounter: Payer: BC Managed Care – PPO | Admitting: Women's Health

## 2020-06-12 ENCOUNTER — Ambulatory Visit: Payer: BC Managed Care – PPO | Admitting: Obstetrics and Gynecology

## 2020-06-13 ENCOUNTER — Ambulatory Visit (INDEPENDENT_AMBULATORY_CARE_PROVIDER_SITE_OTHER): Payer: BC Managed Care – PPO | Admitting: Obstetrics and Gynecology

## 2020-06-13 ENCOUNTER — Encounter: Payer: Self-pay | Admitting: Obstetrics and Gynecology

## 2020-06-13 MED ORDER — CEPHALEXIN 500 MG PO CAPS
500.0000 mg | ORAL_CAPSULE | Freq: Four times a day (QID) | ORAL | 0 refills | Status: DC
Start: 2020-06-13 — End: 2020-07-04

## 2020-06-13 NOTE — Progress Notes (Signed)
Patient ID: SHANITA KANAN, female   DOB: 25-Feb-1996, 24 y.o.   MRN: [id number]    [facility] Clinic Visit  @DATE @            Patient name: Lauren Sherman MRN Lunette Stands  Date of birth: 1996/05/02  CC & HPI:  Lauren Sherman is a 24 y.o. female presenting today for a follow up. She gave birth to her first child on 05/30/2020 and had a 3rd degree 3C Perineal laceration. She was in labor for 13 hours. She saw 06/01/2020 on 06/05/2020 and was still sore. She was taking oxycodone and alternating apap/ibuprofen.   Today, she is doing much better. She still has mild tenderness but the bleeding has almost stopped. She does report soreness in her legs. She is still on a stool softener. The patient denies fever, chills or any other symptoms or complaints at this time.   The patient is breast and bottle feeding her baby.  ROS:  ROS  + mild tenderness and bleeding due to perineal laceration + leg soreness - fever - chills All systems are negative except as noted in the HPI and PMH.   Pertinent History Reviewed:   Reviewed: Medical         Past Medical History:  Diagnosis Date  . BV (bacterial vaginosis) 07/13/2016  . Chlamydia infection 07/13/2016  . Encounter for education about contraceptive use 12/17/2015  . Gonorrhea   . Migraines   . Nexplanon insertion 01/22/2016   Inserted left arm 01/22/16                              Surgical Hx:    Past Surgical History:  Procedure Laterality Date  . NO PAST SURGERIES     Medications: Reviewed & Updated - see associated section                       Current Outpatient Medications:  .  acetaminophen (TYLENOL) 325 MG tablet, Take 2 tablets (650 mg total) by mouth every 6 (six) hours., Disp: , Rfl:  .  coconut oil OIL, Apply 1 application topically as needed., Disp: , Rfl: 0 .  docusate sodium (COLACE) 100 MG capsule, Take 1 capsule (100 mg total) by mouth 2 (two) times daily., Disp: 10 capsule, Rfl: 0 .  ibuprofen (ADVIL) 600 MG tablet, Take 1  tablet (600 mg total) by mouth every 8 (eight) hours. Take with food and drink., Disp: 30 tablet, Rfl: 0 .  norethindrone (ORTHO MICRONOR) 0.35 MG tablet, Take 1 tablet (0.35 mg total) by mouth daily. (Patient not taking: Reported on 06/05/2020), Disp: 28 tablet, Rfl: 1 .  polyethylene glycol (MIRALAX / GLYCOLAX) 17 g packet, Take 17 g by mouth daily as needed for moderate constipation., Disp: 14 each, Rfl: 0 .  Prenatal Vit-Fe Fumarate-FA (PRENATAL VITAMINS PO), Take by mouth. gummies-2 daily, Disp: , Rfl:    Social History: Reviewed -  reports that she has quit smoking. Her smoking use included cigarettes. She smoked 0.00 packs per day. She has never used smokeless tobacco.  Objective Findings:  Vitals: currently breastfeeding.  PHYSICAL EXAMINATION General appearance - alert, well appearing, and in no distress and oriented to person, place, and time Mental status - alert, oriented to person, place, and time, normal mood, behavior, speech, dress, motor activity, and thought processes, affect appropriate to mood  PELVIC External genitalia - asymmetric healing defect. There  are 2 small 2-5 mm defects in the epithelium. 2 sutures are protruding. 1. Just outside the anus, there are two 3-5 mm areas that may develop granulation tissue or a communication. Purulent d/c noted from both.One suture knot was removed today from the perineal body further anterior.. .   Assessment & Plan:   A:  2. 2 weeks s/p vaginal delivery with 3rd degree 3C perineal laceration P:  1. Rx Keflex QID x 7 days 2. Recommended reaching out to lactation consultant regarding breastfeeding 3. F/U in 1 week for recheck 4. Nexplanon insertion at 4 week postpartum visit  By signing my name below, I, Pietro Cassis, attest that this documentation has been prepared under the direction and in the presence of Tilda Burrow, MD. Electronically Signed: Pietro Cassis, Medical Scribe. 06/13/20. 11:33 AM.  I personally performed  the services described in this documentation, which was SCRIBED in my presence. The recorded information has been reviewed and considered accurate. It has been edited as necessary during review. Tilda Burrow, MD

## 2020-06-20 ENCOUNTER — Encounter: Payer: Self-pay | Admitting: Obstetrics and Gynecology

## 2020-06-20 ENCOUNTER — Other Ambulatory Visit: Payer: Self-pay

## 2020-06-20 ENCOUNTER — Ambulatory Visit (INDEPENDENT_AMBULATORY_CARE_PROVIDER_SITE_OTHER): Payer: BC Managed Care – PPO | Admitting: Obstetrics and Gynecology

## 2020-06-20 NOTE — Progress Notes (Signed)
Patient ID: LUIS SAMI, female   DOB: 03-Jan-1996, 24 y.o.   MRN: [id number]    [facility] Clinic Visit  @DATE @            Patient name: ZAKYA HALABI MRN Lunette Stands  Date of birth: May 22, 1996  CC & HPI:  LEO WEYANDT is a 24 y.o. female presenting today for a follow up. She gave birth to her first child on 05/30/2020 and had a 3rd degree 3C Perineal laceration. The patient reached out on 06/14/2020 vis MyChart because she noticed some bright red blood around her anus when she used the bathroom. Since then, she has not had any additional bleeding. The patient denies fever, chills or any other symptoms or complaints at this time. She has not been sexually active since the delivery.  ROS:  ROS - fever - chills All systems are negative except as noted in the HPI and PMH.   Pertinent History Reviewed:   Reviewed: Medical         Past Medical History:  Diagnosis Date  . BV (bacterial vaginosis) 07/13/2016  . Chlamydia infection 07/13/2016  . Encounter for education about contraceptive use 12/17/2015  . Gonorrhea   . Migraines   . Nexplanon insertion 01/22/2016   Inserted left arm 01/22/16                              Surgical Hx:    Past Surgical History:  Procedure Laterality Date  . NO PAST SURGERIES     Medications: Reviewed & Updated - see associated section                       Current Outpatient Medications:  .  acetaminophen (TYLENOL) 325 MG tablet, Take 2 tablets (650 mg total) by mouth every 6 (six) hours., Disp: , Rfl:  .  cephALEXin (KEFLEX) 500 MG capsule, Take 1 capsule (500 mg total) by mouth 4 (four) times daily., Disp: 28 capsule, Rfl: 0 .  coconut oil OIL, Apply 1 application topically as needed., Disp: , Rfl: 0 .  docusate sodium (COLACE) 100 MG capsule, Take 1 capsule (100 mg total) by mouth 2 (two) times daily., Disp: 10 capsule, Rfl: 0 .  ibuprofen (ADVIL) 600 MG tablet, Take 1 tablet (600 mg total) by mouth every 8 (eight) hours. Take with food and drink.,  Disp: 30 tablet, Rfl: 0 .  norethindrone (ORTHO MICRONOR) 0.35 MG tablet, Take 1 tablet (0.35 mg total) by mouth daily. (Patient not taking: Reported on 06/05/2020), Disp: 28 tablet, Rfl: 1 .  polyethylene glycol (MIRALAX / GLYCOLAX) 17 g packet, Take 17 g by mouth daily as needed for moderate constipation., Disp: 14 each, Rfl: 0 .  Prenatal Vit-Fe Fumarate-FA (PRENATAL VITAMINS PO), Take by mouth. gummies-2 daily, Disp: , Rfl:    Social History: Reviewed -  reports that she has quit smoking. Her smoking use included cigarettes. She smoked 0.00 packs per day. She has never used smokeless tobacco.  Objective Findings:  Vitals: currently breastfeeding.  PHYSICAL EXAMINATION General appearance - alert, well appearing, and in no distress and oriented to person, place, and time Mental status - alert, oriented to person, place, and time, normal mood, behavior, speech, dress, motor activity, and thought processes, affect appropriate to mood  PELVIC 2 small defects in the epithelium in the perineal body, still healing by secondary intention. One suture knot was removed today. No evidence  of RV fistula.on Digital rectal.  Assessment & Plan:   A:  1.  3 weeks s/p vaginal delivery with 3rd degree 3C perineal laceration  P:  1. Nexplanon insertion at 4 week postpartum visit 2. Continue using stool softener  By signing my name below, I, Pietro Cassis, attest that this documentation has been prepared under the direction and in the presence of Tilda Burrow, MD. Electronically Signed: Pietro Cassis, Medical Scribe. 06/20/20. 11:52 AM.  I personally performed the services described in this documentation, which was SCRIBED in my presence. The recorded information has been reviewed and considered accurate. It has been edited as necessary during review. Tilda Burrow, MD

## 2020-07-01 ENCOUNTER — Ambulatory Visit: Payer: BC Managed Care – PPO | Admitting: Women's Health

## 2020-07-04 ENCOUNTER — Ambulatory Visit (INDEPENDENT_AMBULATORY_CARE_PROVIDER_SITE_OTHER): Payer: BC Managed Care – PPO | Admitting: Women's Health

## 2020-07-04 ENCOUNTER — Encounter: Payer: Self-pay | Admitting: Women's Health

## 2020-07-04 DIAGNOSIS — Z30017 Encounter for initial prescription of implantable subdermal contraceptive: Secondary | ICD-10-CM | POA: Diagnosis not present

## 2020-07-04 LAB — POCT URINE PREGNANCY: Preg Test, Ur: NEGATIVE

## 2020-07-04 MED ORDER — ETONOGESTREL 68 MG ~~LOC~~ IMPL
68.0000 mg | DRUG_IMPLANT | Freq: Once | SUBCUTANEOUS | Status: AC
  Administered 2020-07-04: 68 mg via SUBCUTANEOUS

## 2020-07-04 NOTE — Patient Instructions (Signed)
Keep the area clean and dry.  You can remove the big bandage in 24 hours, and the small steri-strip bandage in 3-5 days.  A back up method, such as condoms, should be used for two weeks. You may have irregular vaginal bleeding for the first 6 months after the Nexplanon is placed, then the bleeding usually lightens and it is possible that you may not have any periods.  If you have any concerns, please give us a call.    Etonogestrel implant What is this medicine? ETONOGESTREL (et oh noe JES trel) is a contraceptive (birth control) device. It is used to prevent pregnancy. It can be used for up to 3 years. This medicine may be used for other purposes; ask your health care provider or pharmacist if you have questions. COMMON BRAND NAME(S): Implanon, Nexplanon What should I tell my health care provider before I take this medicine? They need to know if you have any of these conditions:  abnormal vaginal bleeding  blood vessel disease or blood clots  breast, cervical, endometrial, ovarian, liver, or uterine cancer  diabetes  gallbladder disease  heart disease or recent heart attack  high blood pressure  high cholesterol or triglycerides  kidney disease  liver disease  migraine headaches  seizures  stroke  tobacco smoker  an unusual or allergic reaction to etonogestrel, anesthetics or antiseptics, other medicines, foods, dyes, or preservatives  pregnant or trying to get pregnant  breast-feeding How should I use this medicine? This device is inserted just under the skin on the inner side of your upper arm by a health care professional. Talk to your pediatrician regarding the use of this medicine in children. Special care may be needed. Overdosage: If you think you have taken too much of this medicine contact a poison control center or emergency room at once. NOTE: This medicine is only for you. Do not share this medicine with others. What if I miss a dose? This does not  apply. What may interact with this medicine? Do not take this medicine with any of the following medications:  amprenavir  fosamprenavir This medicine may also interact with the following medications:  acitretin  aprepitant  armodafinil  bexarotene  bosentan  carbamazepine  certain medicines for fungal infections like fluconazole, ketoconazole, itraconazole and voriconazole  certain medicines to treat hepatitis, HIV or AIDS  cyclosporine  felbamate  griseofulvin  lamotrigine  modafinil  oxcarbazepine  phenobarbital  phenytoin  primidone  rifabutin  rifampin  rifapentine  St. John's wort  topiramate This list may not describe all possible interactions. Give your health care provider a list of all the medicines, herbs, non-prescription drugs, or dietary supplements you use. Also tell them if you smoke, drink alcohol, or use illegal drugs. Some items may interact with your medicine. What should I watch for while using this medicine? This product does not protect you against HIV infection (AIDS) or other sexually transmitted diseases. You should be able to feel the implant by pressing your fingertips over the skin where it was inserted. Contact your doctor if you cannot feel the implant, and use a non-hormonal birth control method (such as condoms) until your doctor confirms that the implant is in place. Contact your doctor if you think that the implant may have broken or become bent while in your arm. You will receive a user card from your health care provider after the implant is inserted. The card is a record of the location of the implant in your upper arm   and when it should be removed. Keep this card with your health records. What side effects may I notice from receiving this medicine? Side effects that you should report to your doctor or health care professional as soon as possible:  allergic reactions like skin rash, itching or hives, swelling of the  face, lips, or tongue  breast lumps, breast tissue changes, or discharge  breathing problems  changes in emotions or moods  coughing up blood  if you feel that the implant may have broken or bent while in your arm  high blood pressure  pain, irritation, swelling, or bruising at the insertion site  scar at site of insertion  signs of infection at the insertion site such as fever, and skin redness, pain or discharge  signs and symptoms of a blood clot such as breathing problems; changes in vision; chest pain; severe, sudden headache; pain, swelling, warmth in the leg; trouble speaking; sudden numbness or weakness of the face, arm or leg  signs and symptoms of liver injury like dark yellow or brown urine; general ill feeling or flu-like symptoms; light-colored stools; loss of appetite; nausea; right upper belly pain; unusually weak or tired; yellowing of the eyes or skin  unusual vaginal bleeding, discharge Side effects that usually do not require medical attention (report to your doctor or health care professional if they continue or are bothersome):  acne  breast pain or tenderness  headache  irregular menstrual bleeding  nausea This list may not describe all possible side effects. Call your doctor for medical advice about side effects. You may report side effects to FDA at 1-800-FDA-1088. Where should I keep my medicine? This drug is given in a hospital or clinic and will not be stored at home. NOTE: This sheet is a summary. It may not cover all possible information. If you have questions about this medicine, talk to your doctor, pharmacist, or health care provider.  2020 Elsevier/Gold Standard (2019-08-01 11:33:04)  

## 2020-07-04 NOTE — Progress Notes (Signed)
POSTPARTUM VISIT Patient name: BRIAWNA CARVER MRN [id number]  Date of birth: [birth year] Chief Complaint:   Postpartum Care (Nexplanon)  History of Present Illness:   MELISIA LEMING is a 24 y.o. G16P1001 African American female being seen today for a postpartum visit and nexplanon insertion. She is 5 weeks postpartum following a spontaneous vaginal delivery at 40.3 gestational weeks, 3C lac, mild PPH given cytotec.  Anesthesia: epidural.  I have fully reviewed the prenatal and intrapartum course. Pregnancy uncomplicated. Postpartum course has been complicated by perineal defects slow to heal. Pt states it's feeling much better, not tender at all when wiping. Bleeding no bleeding. Bowel function is normal. Bladder function is normal.  Patient is not sexually active. Last sexual activity: prior to birth of baby.    No LMP recorded.  Baby's course has been uncomplicated. Baby is feeding by bottle     Upstream - 07/04/20 1455      Pregnancy Intention Screening   Does the patient want to become pregnant in the next year? No    Does the patient's partner want to become pregnant in the next year? No    Would the patient like to discuss contraceptive options today? No      Contraception Wrap Up   Current Method Abstinence    End Method Hormonal Implant    Contraception Counseling Provided Yes          The pregnancy intention screening data noted above was reviewed. Potential methods of contraception were discussed. The patient elected to proceed with Hormonal Implant.   Edinburgh Postpartum Depression Screening:   Edinburgh Postnatal Depression Scale - 07/04/20 1454      Edinburgh Postnatal Depression Scale:  In the Past 7 Days   I have been able to laugh and see the funny side of things. 0    I have looked forward with enjoyment to things. 0    I have blamed myself unnecessarily when things went wrong. 0    I have been anxious or worried for no good reason. 0    I have felt scared or  panicky for no good reason. 0    Things have been getting on top of me. 0    I have been so unhappy that I have had difficulty sleeping. 0    I have felt sad or miserable. 0    I have been so unhappy that I have been crying. 0    The thought of harming myself has occurred to me. 0    Edinburgh Postnatal Depression Scale Total 0          Review of Systems:   Pertinent items are noted in HPI Denies Abnormal vaginal discharge w/ itching/odor/irritation, headaches, visual changes, shortness of breath, chest pain, abdominal pain, severe nausea/vomiting, or problems with urination or bowel movements. Pertinent History Reviewed:  Reviewed past medical,surgical, obstetrical and family history.  Reviewed problem list, medications and allergies. OB History  Gravida Para Term Preterm AB Living  '1 1 1 ' 0 0 1  SAB TAB Ectopic Multiple Live Births  0 0 0 0 1    # Outcome Date GA Lbr Len/2nd Weight Sex Delivery Anes PTL Lv  1 Term 05/30/20 [id number] / 02:52 9 lb 3.8 oz (4.19 kg) M Vag-Spont EPI  LIV     Birth Comments: WNL   Physical Assessment:   Vitals:   07/04/20 1453  Weight: 212 lb (96.2 kg)  Height: '5\' 3"'  (1.6 m)  Body mass  index is 37.55 kg/m.  NEXPLANON INSERTION Risks/benefits/side effects of Nexplanon have been discussed and her questions have been answered.  Specifically, a failure rate of 11/998 has been reported, with an increased failure rate if pt takes Star and/or antiseizure medicaitons.  She is aware of the common side effect of irregular bleeding, which the incidence of decreases over time. Signed copy of informed consent in chart.   Time out was performed.  She is right-handed, so her left arm, approximately 10cm from the medial epicondyle and 3-5cm posterior to the sulcus, was cleansed with alcohol and anesthetized with 2cc of 2% Lidocaine.  The area was cleansed again with betadine and the Nexplanon was inserted per manufacturer's recommendations without  difficulty.  3 steri-strips and pressure bandage were applied. The patient tolerated the procedure well.        Physical Examination:   General appearance: alert, well appearing, and in no distress  Mental status: alert, oriented to person, place, and time  Skin: warm & dry   Cardiovascular: normal heart rate noted   Respiratory: normal respiratory effort, no distress   Breasts: deferred, no complaints   Abdomen: soft, non-tender   Pelvic: perineum healing well, still 2 small defects in perineal body healing   Rectal: no hemorrhoids  Extremities: no edema       Results for orders placed or performed in visit on 07/04/20 (from the past 24 hour(s))  POCT urine pregnancy   Collection Time: 07/04/20  2:58 PM  Result Value Ref Range   Preg Test, Ur Negative Negative    Assessment & Plan:  1) Postpartum exam 2) 5 wks s/p SVB w/ 3rd degree lac 3) Bottlefeeding 4) Depression screening 5) 3rd degree lac continuing to heal>wait at least 2 more weeks before having sex 6) Nexplanon insertion Pt was instructed to keep the area clean and dry, remove pressure bandage in 24 hours, and keep insertion site covered with the steri-strip for 3-5 days.  Back up contraception was recommended for 2 weeks.  She was given a card indicating date Nexplanon was inserted and date it needs to be removed. Follow-up PRN problems.  Essential components of care per ACOG recommendations:  1.  Mood and well being:  . Patient with negative depression screening today.  . Pre-existing mental health disorders? No  . Patient does not use tobacco. . Substance use disorder? No    2. Infant care and feeding:  . Patient currently breastfeeding? No  . Childcare strategy if returning to work/school: FOB . Infant has a pediatrician/family doctor? Yes  . Recommended that all caregivers be immunized for flu, pertussis and other preventable communicable diseases . Pt does have material needs met such as stable housing,  utilities, food and diapers. If not, referred to local resources for help obtaining these.  3. Sexuality, contraception and birth spacing . Provided guidance regarding sexuality, management of dyspareunia, and resumption of intercourse . Patient does not want a pregnancy in the near future.  Desired family size is unsure children.  . Discussed avoiding interpregnancy interval <92mhs and recommended birth spacing of 18 months  4. Sleep and fatigue . Discussed coping options for fatigue and sleep disruption . Encouraged family/partner/community support of 4 hrs of uninterrupted sleep to help with mood and fatigue  5. Physical recovery  . Pt did not have a cesarean section. If yes, assessed incisional pain and providing guidance on normal vs prolonged recovery . Patient had a 3rd degree laceration, perineal healing and pain  reviewed.  . Patient has urinary incontinence? No, fecal incontinence? No  . Patient is safe to resume physical activity. Discussed attainment of healthy weight.  6.  Chronic disease management . Discussed pregnancy complications if any, and their implications for future childbearing and long-term maternal health. . Review recommendations for prevention of recurrent pregnancy complications, such as 17 hydroxyprogesterone caproate to reduce risk for recurrent PTB not applicable, or aspirin to reduce risk of preeclampsia not applicable. . Pt had GDM: No. If yes, 2hr GTT scheduled: not applicable. . Reviewed medications and non-pregnant dosing including consideration of whether pt is breastfeeding using a reliable resource such as LactMed: not applicable . Referred for f/u w/ PCP or subspecialist providers as indicated: not applicable  7. Health maintenance . Last pap smear 12/07/17 and results were normal . Mammogram at 25yo or earlier if indicated  Meds:  Meds ordered this encounter  Medications  . etonogestrel (NEXPLANON) implant 68 mg    Follow-up: Return in about 1  year (around 07/04/2021) for Physical.   Orders Placed This Encounter  Procedures  . POCT urine pregnancy    [place], [facility] 07/04/2020 3:31 PM

## 2020-08-27 ENCOUNTER — Other Ambulatory Visit: Payer: Self-pay | Admitting: Women's Health

## 2020-08-27 MED ORDER — MEGESTROL ACETATE 40 MG PO TABS
ORAL_TABLET | ORAL | 1 refills | Status: DC
Start: 2020-08-27 — End: 2021-01-15

## 2020-12-20 ENCOUNTER — Other Ambulatory Visit: Payer: Self-pay | Admitting: Women's Health

## 2020-12-20 ENCOUNTER — Other Ambulatory Visit: Payer: Self-pay

## 2021-01-15 ENCOUNTER — Other Ambulatory Visit: Payer: Self-pay

## 2021-01-15 ENCOUNTER — Telehealth: Payer: Self-pay | Admitting: Women's Health

## 2021-01-15 MED ORDER — MEGESTROL ACETATE 40 MG PO TABS: ORAL_TABLET | ORAL | 1 refills | Status: DC

## 2021-01-15 NOTE — Telephone Encounter (Signed)
Patient called stating that she is needing a refill of her megace, patient states that she still is bleeding on her nexplanon. Patient uses  [facility]. Patient would like a call back when medication has been sent.

## 2021-01-15 NOTE — Telephone Encounter (Signed)
Called pt to inform her of refill approval. No answer, left vm

## 2021-01-29 ENCOUNTER — Other Ambulatory Visit: Payer: Self-pay | Admitting: Women's Health

## 2021-02-17 ENCOUNTER — Telehealth: Payer: Self-pay | Admitting: Women's Health

## 2021-02-17 NOTE — Telephone Encounter (Signed)
LMOVM returning patient's call.  

## 2021-02-17 NOTE — Telephone Encounter (Signed)
Patient called and left a message stating that she would like to speak with a nurse regarding her short term disability form's. Please contact pt

## 2021-02-20 NOTE — Telephone Encounter (Signed)
Spoke to patient who states she has figured out what she needed and does not need anything else at this time.

## 2021-02-25 ENCOUNTER — Other Ambulatory Visit: Payer: Self-pay | Admitting: Women's Health

## 2021-03-18 ENCOUNTER — Telehealth: Payer: Self-pay

## 2021-03-18 NOTE — Telephone Encounter (Signed)
error 

## 2021-03-19 ENCOUNTER — Encounter: Payer: Self-pay | Admitting: Advanced Practice Midwife

## 2021-03-19 ENCOUNTER — Telehealth: Payer: Self-pay | Admitting: Women's Health

## 2021-03-19 ENCOUNTER — Telehealth (INDEPENDENT_AMBULATORY_CARE_PROVIDER_SITE_OTHER): Payer: 59 | Admitting: Advanced Practice Midwife

## 2021-03-19 ENCOUNTER — Encounter: Payer: Self-pay | Admitting: *Deleted

## 2021-03-19 DIAGNOSIS — F53 Postpartum depression: Secondary | ICD-10-CM | POA: Diagnosis not present

## 2021-03-19 DIAGNOSIS — O99345 Other mental disorders complicating the puerperium: Secondary | ICD-10-CM

## 2021-03-19 DIAGNOSIS — F3289 Other specified depressive episodes: Secondary | ICD-10-CM

## 2021-03-19 DIAGNOSIS — F32A Depression, unspecified: Secondary | ICD-10-CM | POA: Insufficient documentation

## 2021-03-19 NOTE — Progress Notes (Signed)
TELEHEALTH VIRTUAL GYN VISIT ENCOUNTER NOTE Patient name: Lauren Sherman MRN [id number]  Date of birth: 1996/04/24  I connected with patient on 03/19/21 at  1:50 PM EDT by MyChart and verified that I am speaking with the correct person using two identifiers.  Due to COVID-19 recommendations, pt is not currently in the office however the provider is; pt is at home.   I discussed the limitations, risks, security and privacy concerns of performing an evaluation and management service by telephone and the availability of in person appointments. I also discussed with the patient that there Lauren be a patient responsible charge related to this service. The patient expressed understanding and agreed to proceed.   Chief Complaint:   Depression (Feeling stressed anxious)  History of Present Illness:   Lauren Sherman is a 25 y.o. G2P1001 female being evaluated today for depressive symptoms. She is postpartum ~51months; reports having depressed feelings in the past but has never had a diagnosis nor been on medications. Denies HI/SI.      Depression screen [facility] 2/9 03/19/2021 02/28/2020 11/13/2019 12/07/2017 08/11/2016  Decreased Interest 1 0 1 0 0  Down, Depressed, Hopeless 2 0 1 0 0  PHQ - 2 Score 3 0 2 0 0  Altered sleeping 3 0 1 - -  Tired, decreased energy 2 0 1 - -  Change in appetite 1 0 0 - -  Feeling bad or failure about yourself  3 0 0 - -  Trouble concentrating 0 0 0 - -  Moving slowly or fidgety/restless 3 0 0 - -  Suicidal thoughts 0 0 0 - -  PHQ-9 Score 15 0 4 - -    No LMP recorded. The current method of family planning is Nexplanon.  Last pap Feb 2019. Results were:  normal Review of Systems:   Pertinent items are noted in HPI Denies fever/chills, dizziness, headaches, visual disturbances, fatigue, shortness of breath, chest pain, abdominal pain, vomiting, abnormal vaginal discharge/itching/odor/irritation, problems with periods, bowel movements, urination, or intercourse unless  otherwise stated above.  Pertinent History Reviewed:  Reviewed past medical,surgical, social, obstetrical and family history.  Reviewed problem list, medications and allergies. Physical Assessment:  There were no vitals filed for this visit.There is no height or weight on file to calculate BMI.       Physical Examination:   General:  Alert, oriented and cooperative.   Mental Status: Normal mood and affect perceived. Normal judgment and thought content.  Physical exam deferred due to nature of the encounter  No results found for this or any previous visit (from the past 24 hour(s)).  Assessment & Plan:  1) Depression> declines medication currently; would prefer to try therapy to start; [name] will make a referral and she will let us know if she desires medication in the meantime  2) Needs Pap> Pap/physical scheduled at time of depression f/u (June 19, 2021)  Meds: No orders of the defined types were placed in this encounter.   No orders of the defined types were placed in this encounter.   I discussed the assessment and treatment plan with the patient. The patient was provided an opportunity to ask questions and all were answered. The patient agreed with the plan and demonstrated an understanding of the instructions.   The patient was advised to call back or seek an in-person evaluation/go to the ED if the symptoms worsen or if the condition fails to improve as anticipated.  I provided 10 minutes of non-face-to-face time  during this encounter.   Return in about 3 months (around 06/19/2021) for f/u depression, Pap & Physical.  Arabella Merles [facility] 03/19/2021 2:37 PM

## 2021-03-19 NOTE — Telephone Encounter (Signed)
Pt called and stated that she was referred to the hospital and due to the volume they are not taking patients and she would like a referral to another physician

## 2021-03-19 NOTE — Telephone Encounter (Signed)
Pt feels like she is having issues with depression and anxiety. She has never seen anyone for this. She is not suicidal not does she feel like she can harm anyone. I spoke with [name], RN. Pt to have a virtual visit today with [name]. Pt voiced understanding. JSY

## 2021-04-14 ENCOUNTER — Other Ambulatory Visit: Payer: Self-pay

## 2021-04-14 ENCOUNTER — Telehealth: Payer: Self-pay | Admitting: Adult Health

## 2021-04-14 MED ORDER — MEGESTROL ACETATE 40 MG PO TABS: ORAL_TABLET | ORAL | 3 refills | Status: DC

## 2021-04-14 NOTE — Telephone Encounter (Signed)
Patient called stating that she would like a refill of her megace called into her pharmacy. Pt would like a call back once it has been sent. Please contact pt

## 2021-06-17 ENCOUNTER — Other Ambulatory Visit: Payer: Self-pay

## 2021-06-17 ENCOUNTER — Ambulatory Visit
Admission: EM | Admit: 2021-06-17 | Discharge: 2021-06-17 | Disposition: A | Discharge status: Home / Self Care | Payer: Self-pay

## 2021-06-19 ENCOUNTER — Telehealth: Payer: 59 | Admitting: Women's Health

## 2021-07-19 ENCOUNTER — Other Ambulatory Visit: Payer: Self-pay

## 2021-07-19 ENCOUNTER — Encounter (HOSPITAL_COMMUNITY): Payer: Self-pay

## 2021-07-19 ENCOUNTER — Emergency Department (HOSPITAL_COMMUNITY)
Admission: EM | Admit: 2021-07-19 | Discharge: 2021-07-19 | Disposition: A | Discharge status: Home / Self Care | Payer: BC Managed Care – PPO | Attending: Emergency Medicine | Admitting: Emergency Medicine

## 2021-07-19 DIAGNOSIS — R0602 Shortness of breath: Secondary | ICD-10-CM | POA: Diagnosis not present

## 2021-07-19 DIAGNOSIS — R1033 Periumbilical pain: Secondary | ICD-10-CM | POA: Diagnosis not present

## 2021-07-19 DIAGNOSIS — Z87891 Personal history of nicotine dependence: Secondary | ICD-10-CM | POA: Diagnosis not present

## 2021-07-19 DIAGNOSIS — Z91010 Allergy to peanuts: Secondary | ICD-10-CM | POA: Insufficient documentation

## 2021-07-19 DIAGNOSIS — R1010 Upper abdominal pain, unspecified: Secondary | ICD-10-CM | POA: Diagnosis present

## 2021-07-19 LAB — PREGNANCY, URINE: Preg Test, Ur: NEGATIVE

## 2021-07-19 LAB — CBC WITH DIFFERENTIAL/PLATELET
Abs Immature Granulocytes: 0.02 10*3/uL (ref 0.00–0.07)
Basophils Absolute: 0 10*3/uL (ref 0.0–0.1)
Basophils Relative: 0 %
Eosinophils Absolute: 0 10*3/uL (ref 0.0–0.5)
Eosinophils Relative: 0 %
HCT: 40 % (ref 36.0–46.0)
Hemoglobin: 13.5 g/dL (ref 12.0–15.0)
Immature Granulocytes: 0 %
Lymphocytes Relative: 26 %
Lymphs Abs: 1.6 10*3/uL (ref 0.7–4.0)
MCH: 32.9 pg (ref 26.0–34.0)
MCHC: 33.8 g/dL (ref 30.0–36.0)
MCV: 97.6 fL (ref 80.0–100.0)
Monocytes Absolute: 0.5 10*3/uL (ref 0.1–1.0)
Monocytes Relative: 9 %
Neutro Abs: 4 10*3/uL (ref 1.7–7.7)
Neutrophils Relative %: 65 %
Platelets: 210 10*3/uL (ref 150–400)
RBC: 4.1 MIL/uL (ref 3.87–5.11)
RDW: 12 % (ref 11.5–15.5)
WBC: 6.1 10*3/uL (ref 4.0–10.5)
nRBC: 0 % (ref 0.0–0.2)

## 2021-07-19 LAB — COMPREHENSIVE METABOLIC PANEL
ALT: 12 U/L (ref 0–44)
AST: 16 U/L (ref 15–41)
Albumin: 3.9 g/dL (ref 3.5–5.0)
Alkaline Phosphatase: 70 U/L (ref 38–126)
Anion gap: 5 (ref 5–15)
BUN: 15 mg/dL (ref 6–20)
CO2: 25 mmol/L (ref 22–32)
Calcium: 8.6 mg/dL — ABNORMAL LOW (ref 8.9–10.3)
Chloride: 106 mmol/L (ref 98–111)
Creatinine, Ser: 0.75 mg/dL (ref 0.44–1.00)
GFR, Estimated: >60 mL/min (ref >60)
Glucose, Bld: 80 mg/dL (ref 70–99)
Potassium: 4 mmol/L (ref 3.5–5.1)
Sodium: 136 mmol/L (ref 135–145)
Total Bilirubin: 0.8 mg/dL (ref 0.3–1.2)
Total Protein: 6.8 g/dL (ref 6.5–8.1)

## 2021-07-19 LAB — URINALYSIS, ROUTINE W REFLEX MICROSCOPIC
Bacteria, UA: NONE SEEN
Bilirubin Urine: NEGATIVE
Glucose, UA: NEGATIVE mg/dL
Ketones, ur: NEGATIVE mg/dL
Leukocytes,Ua: NEGATIVE
Nitrite: NEGATIVE
Protein, ur: NEGATIVE mg/dL
Specific Gravity, Urine: 1.023 (ref 1.005–1.030)
pH: 5 (ref 5.0–8.0)

## 2021-07-19 LAB — LIPASE, BLOOD: Lipase: 21 U/L (ref 11–51)

## 2021-07-19 MED ORDER — ONDANSETRON 4 MG PO TBDP
4.0000 mg | ORAL_TABLET | Freq: Three times a day (TID) | ORAL | 0 refills | Status: DC | PRN
Start: 2021-07-19 — End: 2023-08-25

## 2021-07-19 NOTE — ED Provider Notes (Signed)
[facility] EMERGENCY DEPARTMENT Provider Note   CSN: [id number] Arrival date & time: 07/19/21  1001     History Chief Complaint  Patient presents with   Abdominal Pain    Lauren Sherman is a 25 y.o. female presents with abdominal pain and a "bump" above her bellybutton that started yesterday afternoon.  Patient noted she had significant upper abdominal cramping and tenderness that did not go away for several hours with associated shortness of breath due to pain.  She did not take anything for symptoms, they gradually went away on their own.  Pain was made worse with laying on her left side while trying to sleep last night.  No alleviating factors.  Cramping pain has not returned, but patient complains on evaluation she has persistent tenderness.  The bump that she noticed was very swollen, gradually went down in size.  No fevers, chills, chest pain, nausea, vomiting, constipation, diarrhea, dysuria.  Patient is s/p vaginal delivery of child, no complications.  No hernia detected at that time.  No history of abdominal surgeries.   Abdominal Pain Associated symptoms: no chest pain, no chills, no constipation, no diarrhea, no dysuria, no fever, no nausea, no shortness of breath and no vomiting       Past Medical History:  Diagnosis Date   BV (bacterial vaginosis) 07/13/2016   Chlamydia infection 07/13/2016   Encounter for education about contraceptive use 12/17/2015   Gonorrhea    Migraines    Nexplanon insertion 01/22/2016   Inserted left arm 01/22/16    Patient Active Problem List   Diagnosis Date Noted   Depression 03/19/2021   Nexplanon insertion 07/04/2020   Marijuana user 12/04/2019    Past Surgical History:  Procedure Laterality Date   NO PAST SURGERIES       OB History     Gravida  1   Para  1   Term  1   Preterm  0   AB  0   Living  1      SAB  0   IAB  0   Ectopic  0   Multiple  0   Live Births  1           Family History  Problem  Relation Age of Onset   Cancer Paternal Grandmother        breast   Diabetes Paternal Grandmother    Stroke Paternal Grandmother    Hyperlipidemia Paternal Grandmother    Hypertension Maternal Grandfather    Diabetes Father    Diabetes Sister    Cancer Other    Diabetes Maternal Aunt    Cancer Maternal Aunt        breast   Cancer Paternal Uncle        prostate, bone   Diabetes Paternal Uncle     Social History   Tobacco Use   Smoking status: Former    Packs/day: 0.00    Types: Cigarettes   Smokeless tobacco: Never  Vaping Use   Vaping Use: Never used  Substance Use Topics   Alcohol use: Not Currently    Comment: occ   Drug use: Not Currently    Types: Marijuana    Home Medications Prior to Admission medications   Medication Sig Start Date End Date Taking? Authorizing Provider  ondansetron (ZOFRAN ODT) 4 MG disintegrating tablet Take 1 tablet (4 mg total) by mouth every 8 (eight) hours as needed for nausea or vomiting. 07/19/21  Yes Sharla Tankard T, PA-C  megestrol (MEGACE) 40 MG tablet 3x5d, 2x5d, then 1 daily to help control vaginal bleeding. Stop taking when bleeding stops. 04/14/21   Cheral Marker, CNM    Allergies    Cherry and Peanut-containing drug products  Review of Systems   Review of Systems  Constitutional:  Negative for chills and fever.  Respiratory:  Negative for shortness of breath.   Cardiovascular:  Negative for chest pain.  Gastrointestinal:  Positive for abdominal distention and abdominal pain. Negative for constipation, diarrhea, nausea and vomiting.  Genitourinary:  Negative for dysuria, frequency and urgency.  All other systems reviewed and are negative.  Physical Exam Updated Vital Signs BP 112/79   Pulse 65   Temp 97.9 F (36.6 C) (Oral)   Resp 16   Ht 5\' 3"  (1.6 m)   Wt 74.8 kg   SpO2 100%   BMI 29.23 kg/m   Physical Exam Vitals and nursing note reviewed.  Constitutional:      Appearance: Normal appearance.  HENT:      Head: Normocephalic and atraumatic.  Eyes:     Conjunctiva/sclera: Conjunctivae normal.  Cardiovascular:     Rate and Rhythm: Normal rate and regular rhythm.  Pulmonary:     Effort: Pulmonary effort is normal. No respiratory distress.     Breath sounds: Normal breath sounds.  Abdominal:     General: There is no distension.     Palpations: Abdomen is soft.     Tenderness: There is abdominal tenderness in the epigastric area and periumbilical area. There is no guarding or rebound.     Comments: No obvious hernia.  Skin:    General: Skin is warm and dry.  Neurological:     General: No focal deficit present.     Mental Status: She is alert.    ED Results / Procedures / Treatments   Labs (all labs ordered are listed, but only abnormal results are displayed) Labs Reviewed  COMPREHENSIVE METABOLIC PANEL - Abnormal; Notable for the following components:      Result Value   Calcium 8.6 (*)    All other components within normal limits  URINALYSIS, ROUTINE W REFLEX MICROSCOPIC - Abnormal; Notable for the following components:   APPearance HAZY (*)    Hgb urine dipstick SMALL (*)    All other components within normal limits  CBC WITH DIFFERENTIAL/PLATELET  LIPASE, BLOOD  PREGNANCY, URINE    EKG None  Radiology No results found.  Procedures Procedures   Medications Ordered in ED Medications - No data to display  ED Course  I have reviewed the triage vital signs and the nursing notes.  Pertinent labs & imaging results that were available during my care of the patient were reviewed by me and considered in my medical decision making (see chart for details).    MDM Rules/Calculators/A&P                           Patient is 25 y/o female who presents with abdominal pain since yesterday. Patient noted a "lump" protruding in her upper abdomen that has since went away. History of vaginal child birth without complication, no abdominal surgeries. On exam she is afebrile, her  abdomen is soft, with tenderness to palpation of epigastrium and periumbilical area. No obvious hernias on exam.   Plan for labs to evaluate for other causes of upper abdominal pain. CBC, CMP, lipase, urinalysis unremarkable. Urine pregnancy negative. Due to patient appearing otherwise well with no  co-morbidities and reassuring labs, plan to forego imaging at this time to prevent unnecessary radiation.  Discussed findings with patient and referral to general surgery clinic for further workup of possible hernia. Patient is stable to d/c to home with prescription for zofran. Discussed reasons to return to ED. Patient agreeable to plan.   Final Clinical Impression(s) / ED Diagnoses Final diagnoses:  Periumbilical abdominal pain    Rx / DC Orders ED Discharge Orders          Ordered    ondansetron (ZOFRAN ODT) 4 MG disintegrating tablet  Every 8 hours PRN        07/19/21 1341             Stevi Hollinshead T, PA-C 07/19/21 1346    Derwood Kaplan, MD 07/19/21 1519

## 2021-07-19 NOTE — Discharge Instructions (Addendum)
It is possible your pain was from a hernia, but your lab work today was reassuring and I don't think you need admission to the hospital to evaluate it today.   I am attaching the contact information for the general surgery office for you to make an appointment for further evaluation and potentially surgical repair.   I am writing you a prescription for zofran which you have taken by mouth every 8 hours as needed for nausea. You can take over the counter medications for pain such as tylenol.   Continue to monitor your symptoms and return to the ED for new or worsening pain.

## 2021-07-19 NOTE — ED Triage Notes (Signed)
Pt ambulatory to er room number 11, pt states that she is here for abd pain, states that her pain started yesterday and she had a bump above her umbilicus, states that when she pushed on it the bump it was tender

## 2021-08-05 ENCOUNTER — Ambulatory Visit: Payer: BC Managed Care – PPO | Admitting: General Surgery

## 2021-08-14 ENCOUNTER — Other Ambulatory Visit: Payer: Self-pay

## 2021-08-14 ENCOUNTER — Ambulatory Visit
Admission: EM | Admit: 2021-08-14 | Discharge: 2021-08-14 | Disposition: A | Discharge status: Home / Self Care | Payer: BC Managed Care – PPO | Attending: Family Medicine | Admitting: Family Medicine

## 2021-08-14 DIAGNOSIS — J209 Acute bronchitis, unspecified: Secondary | ICD-10-CM | POA: Diagnosis not present

## 2021-08-14 DIAGNOSIS — J3089 Other allergic rhinitis: Secondary | ICD-10-CM | POA: Diagnosis not present

## 2021-08-14 DIAGNOSIS — R062 Wheezing: Secondary | ICD-10-CM

## 2021-08-14 MED ORDER — FLUTICASONE PROPIONATE 50 MCG/ACT NA SUSP: 1.0000 | Freq: Two times a day (BID) | NASAL | 0 refills | Status: DC

## 2021-08-14 MED ORDER — ALBUTEROL SULFATE HFA 108 (90 BASE) MCG/ACT IN AERS: 1.0000 | INHALATION_SPRAY | Freq: Four times a day (QID) | RESPIRATORY_TRACT | 0 refills | Status: DC | PRN

## 2021-08-14 MED ORDER — CETIRIZINE HCL 10 MG PO TABS: 10.0000 mg | ORAL_TABLET | Freq: Every day | ORAL | 2 refills | Status: DC

## 2021-08-14 MED ORDER — DEXAMETHASONE SODIUM PHOSPHATE 10 MG/ML IJ SOLN
10.0000 mg | Freq: Once | INTRAMUSCULAR | Status: AC
  Administered 2021-08-14: 10 mg via INTRAMUSCULAR

## 2021-08-14 MED ORDER — PROMETHAZINE-DM 6.25-15 MG/5ML PO SYRP: 5.0000 mL | ORAL_SOLUTION | Freq: Four times a day (QID) | ORAL | 0 refills | Status: DC | PRN

## 2021-08-14 NOTE — ED Triage Notes (Signed)
Pt presents with complaints of nasal drainage, congestion, and coughing up mucus x 1 week.

## 2021-08-14 NOTE — ED Provider Notes (Signed)
[facility]    CSN: [id number] Arrival date & time: 08/14/21  1411      History   Chief Complaint Chief Complaint  Patient presents with   Nasal Congestion    HPI Lauren Sherman is a 25 y.o. female.   Patient presenting today with 1 week history of nasal congestion, sinus pressure, cough, wheezing, chest tightness, scratchy throat, hoarseness.  Denies known fever, chills, chest pain, shortness of breath, abdominal pain, nausea vomiting or diarrhea.  So far taking DayQuil, NyQuil, cough syrups, ibuprofen with only mild temporary relief of symptoms.  History of seasonal allergies not currently on a regimen for this.  No known sick contacts recently.  Past Medical History:  Diagnosis Date   BV (bacterial vaginosis) 07/13/2016   Chlamydia infection 07/13/2016   Encounter for education about contraceptive use 12/17/2015   Gonorrhea    Migraines    Nexplanon insertion 01/22/2016   Inserted left arm 01/22/16   Patient Active Problem List   Diagnosis Date Noted   Depression 03/19/2021   Nexplanon insertion 07/04/2020   Marijuana user 12/04/2019    Past Surgical History:  Procedure Laterality Date   NO PAST SURGERIES      OB History     Gravida  1   Para  1   Term  1   Preterm  0   AB  0   Living  1      SAB  0   IAB  0   Ectopic  0   Multiple  0   Live Births  1            Home Medications    Prior to Admission medications   Medication Sig Start Date End Date Taking? Authorizing Provider  albuterol (VENTOLIN HFA) 108 (90 Base) MCG/ACT inhaler Inhale 1-2 puffs into the lungs every 6 (six) hours as needed for wheezing or shortness of breath. 08/14/21  Yes Particia Nearing, PA-C  cetirizine (ZYRTEC ALLERGY) 10 MG tablet Take 1 tablet (10 mg total) by mouth daily. 08/14/21  Yes Particia Nearing, PA-C  fluticasone [facility]) 50 MCG/ACT nasal spray Place 1 spray into both nostrils 2 (two) times daily. 08/14/21  Yes Particia Nearing, PA-C  promethazine-dextromethorphan (PROMETHAZINE-DM) 6.25-15 MG/5ML syrup Take 5 mLs by mouth 4 (four) times daily as needed for cough. 08/14/21  Yes Particia Nearing, PA-C  megestrol (MEGACE) 40 MG tablet 3x5d, 2x5d, then 1 daily to help control vaginal bleeding. Stop taking when bleeding stops. 04/14/21   Cheral Marker, CNM  ondansetron (ZOFRAN ODT) 4 MG disintegrating tablet Take 1 tablet (4 mg total) by mouth every 8 (eight) hours as needed for nausea or vomiting. 07/19/21   Roemhildt, Lorin T, PA-C    Family History Family History  Problem Relation Age of Onset   Cancer Paternal Grandmother        breast   Diabetes Paternal Grandmother    Stroke Paternal Grandmother    Hyperlipidemia Paternal Grandmother    Hypertension Maternal Grandfather    Diabetes Father    Diabetes Sister    Cancer Other    Diabetes Maternal Aunt    Cancer Maternal Aunt        breast   Cancer Paternal Uncle        prostate, bone   Diabetes Paternal Uncle     Social History Social History   Tobacco Use   Smoking status: Former    Packs/day: 0.00  Types: Cigarettes   Smokeless tobacco: Never  Vaping Use   Vaping Use: Never used  Substance Use Topics   Alcohol use: Not Currently    Comment: occ   Drug use: Not Currently    Types: Marijuana     Allergies   Cherry and Peanut-containing drug products  Review of Systems Review of Systems Per HPI  Physical Exam Triage Vital Signs ED Triage Vitals  Enc Vitals Group     BP 08/14/21 1434 118/79     Pulse Rate 08/14/21 1434 96     Resp 08/14/21 1434 19     Temp 08/14/21 1434 98.3 F (36.8 C)     Temp src --      SpO2 08/14/21 1434 97 %     Weight --      Height --      Head Circumference --      Peak Flow --      Pain Score 08/14/21 1435 0     Pain Loc --      Pain Edu? --      Excl. in GC? --    No data found.  Updated Vital Signs BP 118/79   Pulse 96   Temp 98.3 F (36.8 C)   Resp 19   LMP  08/14/2021   SpO2 97%   Breastfeeding No   Visual Acuity Right Eye Distance:   Left Eye Distance:   Bilateral Distance:    Right Eye Near:   Left Eye Near:    Bilateral Near:     Physical Exam Vitals and nursing note reviewed.  Constitutional:      Appearance: Normal appearance. She is not ill-appearing.  HENT:     Head: Atraumatic.     Right Ear: Tympanic membrane normal.     Left Ear: Tympanic membrane normal.     Nose: Congestion and rhinorrhea present.     Comments: Nasal turbinates boggy and erythematous bilaterally    Mouth/Throat:     Mouth: Mucous membranes are moist.     Pharynx: Posterior oropharyngeal erythema present. No oropharyngeal exudate.     Comments: Bilateral tonsils edematous, erythematous.  Uvula midline, oral airway patent Eyes:     Extraocular Movements: Extraocular movements intact.     Conjunctiva/sclera: Conjunctivae normal.  Cardiovascular:     Rate and Rhythm: Normal rate and regular rhythm.     Heart sounds: Normal heart sounds.  Pulmonary:     Effort: Pulmonary effort is normal. No respiratory distress.     Breath sounds: Wheezing present. No rales.     Comments: Moderate wheezes bilaterally.  No rales Musculoskeletal:        General: Normal range of motion.     Cervical back: Normal range of motion and neck supple.  Skin:    General: Skin is warm and dry.  Neurological:     Mental Status: She is alert and oriented to person, place, and time.  Psychiatric:        Mood and Affect: Mood normal.        Thought Content: Thought content normal.        Judgment: Judgment normal.   UC Treatments / Results  Labs (all labs ordered are listed, but only abnormal results are displayed) Labs Reviewed - No data to display  EKG   Radiology No results found.  Procedures Procedures (including critical care time)  Medications Ordered in UC Medications  dexamethasone (DECADRON) injection 10 mg (10 mg Intramuscular Given 08/14/21 1501)  Initial Impression / Assessment and Plan / UC Course  I have reviewed the triage vital signs and the nursing notes.  Pertinent labs & imaging results that were available during my care of the patient were reviewed by me and considered in my medical decision making (see chart for details).     Vital signs reassuring today, exam showing significant inflammatory symptoms.  We will treat with IM Decadron in clinic, send albuterol inhaler, Phenergan DM, allergy regimen over to pharmacy.  Discussed over-the-counter symptomatic medications and supportive home care.  Return for acutely worsening symptoms.  School and work note provided.  Final Clinical Impressions(s) / UC Diagnoses   Final diagnoses:  Acute bronchitis, unspecified organism  Wheezing  Seasonal allergic rhinitis due to other allergic trigger   Discharge Instructions   None    ED Prescriptions     Medication Sig Dispense Auth. Provider   albuterol (VENTOLIN HFA) 108 (90 Base) MCG/ACT inhaler Inhale 1-2 puffs into the lungs every 6 (six) hours as needed for wheezing or shortness of breath. 18 g Roosvelt Maser [place], [place]   promethazine-dextromethorphan (PROMETHAZINE-DM) 6.25-15 MG/5ML syrup Take 5 mLs by mouth 4 (four) times daily as needed for cough. 100 mL Particia Nearing, PA-C   fluticasone [facility]) 50 MCG/ACT nasal spray Place 1 spray into both nostrils 2 (two) times daily. 16 g Particia Nearing, [place]   cetirizine (ZYRTEC ALLERGY) 10 MG tablet Take 1 tablet (10 mg total) by mouth daily. 30 tablet Particia Nearing, [place]      PDMP not reviewed this encounter.   Particia Nearing, [place] 08/14/21 1503

## 2021-08-18 ENCOUNTER — Encounter (HOSPITAL_COMMUNITY): Payer: Self-pay

## 2021-08-18 ENCOUNTER — Other Ambulatory Visit: Payer: Self-pay

## 2021-08-18 ENCOUNTER — Ambulatory Visit (HOSPITAL_COMMUNITY)
Admission: EM | Admit: 2021-08-18 | Discharge: 2021-08-18 | Disposition: A | Discharge status: Home / Self Care | Payer: BC Managed Care – PPO | Attending: Physician Assistant | Admitting: Physician Assistant

## 2021-08-18 DIAGNOSIS — R051 Acute cough: Secondary | ICD-10-CM

## 2021-08-18 DIAGNOSIS — J40 Bronchitis, not specified as acute or chronic: Secondary | ICD-10-CM

## 2021-08-18 DIAGNOSIS — J329 Chronic sinusitis, unspecified: Secondary | ICD-10-CM | POA: Diagnosis not present

## 2021-08-18 MED ORDER — AMOXICILLIN-POT CLAVULANATE 875-125 MG PO TABS: 1.0000 | ORAL_TABLET | Freq: Two times a day (BID) | ORAL | 0 refills | Status: DC

## 2021-08-18 NOTE — ED Triage Notes (Signed)
Pt presents with cough, dizziness. States she has been using her inhaler and Promethazine. States it has not helped.

## 2021-08-18 NOTE — Discharge Instructions (Signed)
Start Augmentin twice daily for 7 days to cover for sinus infection.  Continue albuterol as needed.  Take Mucinex, Flonase, Tylenol for additional symptom relief.  Make sure you are resting and drinking plenty of fluid.  If you have any worsening symptoms including persistent cough, high fever, chest pain, shortness of breath you need to be seen immediately.  Follow-up with your primary care provider within a week to ensure symptom improvement.

## 2021-08-18 NOTE — ED Provider Notes (Signed)
[facility]    CSN: [id number] Arrival date & time: 08/18/21  1717      History   Chief Complaint Chief Complaint  Patient presents with   Cough    HPI Lauren Sherman is a 25 y.o. female.   Patient presents today with a several week history of URI symptoms.  She was seen on 08/14/2021 which when she had already had symptoms for over a week and was given Decadron injection with instruction to use Promethazine DM and allergy medicine for symptom relief.  She was prescribed albuterol inhaler and has been using this as needed without significant improvement of symptoms.  She denies past history of asthma or allergies.  She does smoke but has not been smoking since symptoms began.  She reports symptoms are gradually been worsening and she has had trouble doing daily activities as result of symptoms.  She has missed work and school due to illness.  She denies any recent antibiotic use.  She has tried numerous over-the-counter remedies including hot tea and honey, soups, Epson salt baths without improvement of symptoms.   Past Medical History:  Diagnosis Date   BV (bacterial vaginosis) 07/13/2016   Chlamydia infection 07/13/2016   Encounter for education about contraceptive use 12/17/2015   Gonorrhea    Migraines    Nexplanon insertion 01/22/2016   Inserted left arm 01/22/16    Patient Active Problem List   Diagnosis Date Noted   Depression 03/19/2021   Nexplanon insertion 07/04/2020   Marijuana user 12/04/2019    Past Surgical History:  Procedure Laterality Date   NO PAST SURGERIES      OB History     Gravida  1   Para  1   Term  1   Preterm  0   AB  0   Living  1      SAB  0   IAB  0   Ectopic  0   Multiple  0   Live Births  1            Home Medications    Prior to Admission medications   Medication Sig Start Date End Date Taking? Authorizing Provider  amoxicillin-clavulanate (AUGMENTIN) 875-125 MG tablet Take 1 tablet by mouth every  12 (twelve) hours. 08/18/21  Yes Sahib Pella K, PA-C  albuterol (VENTOLIN HFA) 108 (90 Base) MCG/ACT inhaler Inhale 1-2 puffs into the lungs every 6 (six) hours as needed for wheezing or shortness of breath. 08/14/21   Particia Nearing, PA-C  cetirizine (ZYRTEC ALLERGY) 10 MG tablet Take 1 tablet (10 mg total) by mouth daily. 08/14/21   Particia Nearing, PA-C  fluticasone [facility]) 50 MCG/ACT nasal spray Place 1 spray into both nostrils 2 (two) times daily. 08/14/21   Particia Nearing, PA-C  megestrol (MEGACE) 40 MG tablet 3x5d, 2x5d, then 1 daily to help control vaginal bleeding. Stop taking when bleeding stops. 04/14/21   Cheral Marker, CNM  ondansetron (ZOFRAN ODT) 4 MG disintegrating tablet Take 1 tablet (4 mg total) by mouth every 8 (eight) hours as needed for nausea or vomiting. 07/19/21   Roemhildt, Lorin T, PA-C  promethazine-dextromethorphan (PROMETHAZINE-DM) 6.25-15 MG/5ML syrup Take 5 mLs by mouth 4 (four) times daily as needed for cough. 08/14/21   Particia Nearing, PA-C    Family History Family History  Problem Relation Age of Onset   Cancer Paternal Grandmother        breast   Diabetes Paternal Grandmother  Stroke Paternal Grandmother    Hyperlipidemia Paternal Grandmother    Hypertension Maternal Grandfather    Diabetes Father    Diabetes Sister    Cancer Other    Diabetes Maternal Aunt    Cancer Maternal Aunt        breast   Cancer Paternal Uncle        prostate, bone   Diabetes Paternal Uncle     Social History Social History   Tobacco Use   Smoking status: Former    Packs/day: 0.00    Types: Cigarettes   Smokeless tobacco: Never  Vaping Use   Vaping Use: Never used  Substance Use Topics   Alcohol use: Not Currently    Comment: occ   Drug use: Not Currently    Types: Marijuana     Allergies   Cherry and Peanut-containing drug products   Review of Systems Review of Systems  Constitutional:  Positive for activity  change. Negative for appetite change, fatigue and fever.  HENT:  Positive for congestion, postnasal drip, sinus pressure and sore throat. Negative for sneezing.   Respiratory:  Positive for cough. Negative for shortness of breath.   Cardiovascular:  Negative for chest pain.  Gastrointestinal:  Negative for abdominal pain, diarrhea, nausea and vomiting.  Musculoskeletal:  Negative for arthralgias and myalgias.  Neurological:  Positive for headaches. Negative for dizziness and light-headedness.    Physical Exam Triage Vital Signs ED Triage Vitals  Enc Vitals Group     BP 08/18/21 1904 118/77     Pulse Rate 08/18/21 1904 (!) 104     Resp 08/18/21 1904 19     Temp --      Temp Source 08/18/21 1904 Oral     SpO2 08/18/21 1904 99 %     Weight --      Height --      Head Circumference --      Peak Flow --      Pain Score 08/18/21 1902 8     Pain Loc --      Pain Edu? --      Excl. in GC? --    No data found.  Updated Vital Signs BP 118/77 (BP Location: Right Arm)   Pulse (!) 104   Temp 98.6 F (37 C)   Resp 19   LMP 08/14/2021 (Exact Date)   SpO2 99%   Visual Acuity Right Eye Distance:   Left Eye Distance:   Bilateral Distance:    Right Eye Near:   Left Eye Near:    Bilateral Near:     Physical Exam Vitals reviewed.  Constitutional:      General: She is awake. She is not in acute distress.    Appearance: Normal appearance. She is not ill-appearing.  HENT:     Head: Normocephalic and atraumatic.     Right Ear: Ear canal and external ear normal. A middle ear effusion is present. Tympanic membrane is not erythematous or bulging.     Left Ear: Ear canal and external ear normal. A middle ear effusion is present. Tympanic membrane is not erythematous or bulging.     Nose:     Right Sinus: Maxillary sinus tenderness and frontal sinus tenderness present.     Left Sinus: Maxillary sinus tenderness and frontal sinus tenderness present.     Mouth/Throat:     Pharynx: Uvula  midline. Posterior oropharyngeal erythema present. No oropharyngeal exudate.     Comments: Drainage present posterior oropharynx Cardiovascular:  Rate and Rhythm: Normal rate and regular rhythm.     Heart sounds: Normal heart sounds, S1 normal and S2 normal. No murmur heard. Pulmonary:     Effort: Pulmonary effort is normal.     Breath sounds: Normal breath sounds. No wheezing, rhonchi or rales.     Comments: Clear to auscultation bilaterally Psychiatric:        Behavior: Behavior is cooperative.     UC Treatments / Results  Labs (all labs ordered are listed, but only abnormal results are displayed) Labs Reviewed - No data to display  EKG   Radiology No results found.  Procedures Procedures (including critical care time)  Medications Ordered in UC Medications - No data to display  Initial Impression / Assessment and Plan / UC Course  I have reviewed the triage vital signs and the nursing notes.  Pertinent labs & imaging results that were available during my care of the patient were reviewed by me and considered in my medical decision making (see chart for details).      No indication for viral testing if patient has been symptomatic for several weeks.  Given prolonged and worsening symptoms we will start antibiotic and she was prescribed Augmentin to start twice daily for 7 days.  Recommend over-the-counter medications including Mucinex, Flonase, Tylenol for additional symptom relief.  Recommend she rest and drink plenty of fluid.  She was given school excuse note as requested.  Discussed that if symptoms or not improving she should follow-up with primary care provider or be reevaluated by clinic.  Discussed alarm symptoms that warrant emergent evaluation.  Strict return precautions given to which she expressed understanding.  Final Clinical Impressions(s) / UC Diagnoses   Final diagnoses:  Sinobronchitis  Acute cough     Discharge Instructions      Start  Augmentin twice daily for 7 days to cover for sinus infection.  Continue albuterol as needed.  Take Mucinex, Flonase, Tylenol for additional symptom relief.  Make sure you are resting and drinking plenty of fluid.  If you have any worsening symptoms including persistent cough, high fever, chest pain, shortness of breath you need to be seen immediately.  Follow-up with your primary care provider within a week to ensure symptom improvement.     ED Prescriptions     Medication Sig Dispense Auth. Provider   amoxicillin-clavulanate (AUGMENTIN) 875-125 MG tablet Take 1 tablet by mouth every 12 (twelve) hours. 14 tablet Keona Sheffler, Noberto Retort, PA-C      PDMP not reviewed this encounter.   Jeani Hawking, PA-C 08/18/21 2005

## 2021-08-21 ENCOUNTER — Ambulatory Visit: Payer: BC Managed Care – PPO | Admitting: General Surgery

## 2021-10-16 ENCOUNTER — Ambulatory Visit: Payer: BC Managed Care – PPO | Admitting: General Surgery

## 2022-04-08 ENCOUNTER — Telehealth: Payer: Self-pay | Admitting: Obstetrics & Gynecology

## 2022-04-08 ENCOUNTER — Ambulatory Visit: Payer: Medicaid Other | Admitting: Obstetrics & Gynecology

## 2022-04-08 NOTE — Telephone Encounter (Signed)
Patient had to cancel her appointment today due to her menstrual cycle starting. She is rescheduled for next month and wants to see if she can get a refill called in for MEGACE. Please advise.

## 2022-04-08 NOTE — Telephone Encounter (Signed)
LMOVM requesting patient return my call.  

## 2022-05-19 ENCOUNTER — Ambulatory Visit: Payer: Medicaid Other | Admitting: Obstetrics & Gynecology

## 2022-06-08 ENCOUNTER — Ambulatory Visit: Payer: Medicaid Other | Admitting: Obstetrics & Gynecology

## 2022-06-25 ENCOUNTER — Ambulatory Visit: Payer: Medicaid Other | Admitting: Obstetrics & Gynecology

## 2022-08-06 ENCOUNTER — Ambulatory Visit: Payer: Medicaid Other | Admitting: Adult Health

## 2022-09-18 ENCOUNTER — Other Ambulatory Visit (HOSPITAL_COMMUNITY)
Admission: RE | Admit: 2022-09-18 | Discharge: 2022-09-18 | Disposition: A | Discharge status: Home / Self Care | Payer: Medicaid Other | Source: Ambulatory Visit | Attending: Adult Health | Admitting: Adult Health

## 2022-09-18 ENCOUNTER — Ambulatory Visit (INDEPENDENT_AMBULATORY_CARE_PROVIDER_SITE_OTHER): Payer: Medicaid Other | Admitting: Adult Health

## 2022-09-18 ENCOUNTER — Encounter: Payer: Self-pay | Admitting: Adult Health

## 2022-09-18 VITALS — BP 107/74 | HR 79 | Ht 63.0 in | Wt 191.0 lb

## 2022-09-18 DIAGNOSIS — Z975 Presence of (intrauterine) contraceptive device: Secondary | ICD-10-CM | POA: Diagnosis not present

## 2022-09-18 DIAGNOSIS — Z113 Encounter for screening for infections with a predominantly sexual mode of transmission: Secondary | ICD-10-CM

## 2022-09-18 DIAGNOSIS — Z0000 Encounter for general adult medical examination without abnormal findings: Secondary | ICD-10-CM | POA: Diagnosis not present

## 2022-09-18 DIAGNOSIS — Z01419 Encounter for gynecological examination (general) (routine) without abnormal findings: Secondary | ICD-10-CM

## 2022-09-18 NOTE — Progress Notes (Signed)
Patient ID: Lauren Sherman, female   DOB: November 03, 1995, 26 y.o.   MRN: [id number] History of Present Illness: Lauren Sherman is a 26 year old female, single, G1P1 in for a well woman gyn exam and pap. She has nexplanon and spots some irregularly, will take megace and that helps. PCP is [facility] in [place].   Current Medications, Allergies, Past Medical History, Past Surgical History, Family History and Social History were reviewed in [employer] record.     Review of Systems: Patient denies any headaches, hearing loss, fatigue, blurred vision, shortness of breath, chest pain, abdominal pain, problems with bowel movements, urination, or intercourse(not active). No joint pain or mood swings.     Physical Exam:BP 107/74 (BP Location: Left Arm, Patient Position: Sitting, Cuff Size: Normal)   Pulse 79   Ht 5\' 3"  (1.6 m)   Wt 191 lb (86.6 kg)   BMI 33.83 kg/m   General:  Well developed, well nourished, no acute distress Skin:  Warm and dry Neck:  Midline trachea, normal thyroid, good ROM, no lymphadenopathy Lungs; Clear to auscultation bilaterally Breast:  No dominant palpable mass, retraction, or nipple discharge Cardiovascular: Regular rate and rhythm Abdomen:  Soft, non tender, no hepatosplenomegaly Pelvic:  External genitalia is normal in appearance, no lesions.  The vagina is normal in appearance, scant blood. Urethra has no lesions or masses. The cervix is bulbous.pap with GC/CHL and HR HPV genotyping performed.  Uterus is felt to be normal size, shape, and contour.  No adnexal masses or tenderness noted.Bladder is non tender, no masses felt. Rectal: Deferred Extremities/musculoskeletal:  No swelling or varicosities noted, no clubbing or cyanosis Psych:  No mood changes, alert and cooperative,seems happy AA is 6 Fall risk is low    09/18/2022   11:54 AM 03/19/2021    1:54 PM 02/28/2020    9:47 AM  Depression screen PHQ 2/9  Decreased Interest 1 1 0  Down,  Depressed, Hopeless 0 2 0  PHQ - 2 Score 1 3 0  Altered sleeping 1 3 0  Tired, decreased energy 1 2 0  Change in appetite 1 1 0  Feeling bad or failure about yourself  0 3 0  Trouble concentrating 0 0 0  Moving slowly or fidgety/restless 0 3 0  Suicidal thoughts 0 0 0  PHQ-9 Score 4 15 0       09/18/2022   11:55 AM 03/19/2021    1:57 PM 02/28/2020    9:47 AM  GAD 7 : Generalized Anxiety Score  Nervous, Anxious, on Edge 1 3 0  Control/stop worrying 0 3 0  Worry too much - different things 0 3 1  Trouble relaxing 0 3 0  Restless 3 3 0  Easily annoyed or irritable 1 2 0  Afraid - awful might happen 0 1 0  Total GAD 7 Score 5 18 1   Anxiety Difficulty   Not difficult at all      Upstream - 09/18/22 1153       Pregnancy Intention Screening   Does the patient want to become pregnant in the next year? No    Does the patient's partner want to become pregnant in the next year? No    Would the patient like to discuss contraceptive options today? No      Contraception Wrap Up   Current Method Hormonal Implant    End Method Hormonal Implant    Contraception Counseling Provided No  Examination chaperoned by Dorthula Perfect RN    Impression and plan: 1. Encounter for gynecological examination with Papanicolaou smear of cervix Pap sent Pap in 3 years if normal Physical in 1 year Labs with new PCP   2. Nexplanon in place Placed 07/04/20 Return August 2024 for removal and reinsertion

## 2022-09-21 LAB — CYTOLOGY - PAP
Chlamydia: NEGATIVE
Comment: NEGATIVE
Comment: NEGATIVE
Comment: NORMAL
Diagnosis: NEGATIVE
High risk HPV: NEGATIVE
Neisseria Gonorrhea: NEGATIVE

## 2023-03-23 ENCOUNTER — Ambulatory Visit
Admission: EM | Admit: 2023-03-23 | Discharge: 2023-03-23 | Disposition: A | Discharge status: Home / Self Care | Payer: Managed Care, Other (non HMO) | Attending: Nurse Practitioner | Admitting: Nurse Practitioner

## 2023-03-23 DIAGNOSIS — S0181XA Laceration without foreign body of other part of head, initial encounter: Secondary | ICD-10-CM | POA: Diagnosis not present

## 2023-03-23 NOTE — ED Triage Notes (Signed)
Pt c/o laceration pt states she was looking at something underneath her jeep with the door open the strap on it is out further than it is supposed to be, when she sat up she hit her head on the bottom of the door gashing her head open

## 2023-03-23 NOTE — Discharge Instructions (Signed)
3 sutures were placed to the laceration on your forehead.  Keep the bandage in place until the next morning.  Cleanse the area with warm water and cover with Neosporin and a bandage. May take over-the-counter Tylenol as needed for pain or discomfort. Apply ice to the left forehead to help with swelling and pain.  Apply for 20 minutes, remove for 1 hour, then repeat as needed.  Recommend doing this as much as possible for the next 24 hours. Monitor the area for signs of infection to include swelling, redness, or foul-smelling drainage to the site.  If you notice any of the symptoms, please follow-up in this clinic immediately for further evaluation. This is can be removed in 7 days.  You can follow-up in this on 03/30/2023 for suture removal. Follow-up as needed.

## 2023-03-23 NOTE — ED Provider Notes (Signed)
[facility]    CSN: [id number] Arrival date & time: 03/23/23  1559      History   Chief Complaint No chief complaint on file.   HPI Lauren Sherman is a 27 y.o. female.   The history is provided by the patient.   The patient presents with a laceration above the left eyebrow that occurred earlier today after she hit her head on the bottom of her vehicle door.  Patient denies loss of consciousness, lightheaded, dizziness, fever, chills, chest pain, abdominal pain, nausea, vomiting, or diarrhea.  Patient reports that she currently does not take any blood thinners.  Patient also reports that her last tetanus shot was in 2021.  Past Medical History:  Diagnosis Date   BV (bacterial vaginosis) 07/13/2016   Chlamydia infection 07/13/2016   Encounter for education about contraceptive use 12/17/2015   Gonorrhea    Migraines    Nexplanon insertion 01/22/2016   Inserted left arm 01/22/16    Patient Active Problem List   Diagnosis Date Noted   Depression 03/19/2021   Nexplanon insertion 07/04/2020   Marijuana user 12/04/2019    Past Surgical History:  Procedure Laterality Date   NO PAST SURGERIES      OB History     Gravida  1   Para  1   Term  1   Preterm  0   AB  0   Living  1      SAB  0   IAB  0   Ectopic  0   Multiple  0   Live Births  1            Home Medications    Prior to Admission medications   Medication Sig Start Date End Date Taking? Authorizing Provider  albuterol (VENTOLIN HFA) 108 (90 Base) MCG/ACT inhaler Inhale 1-2 puffs into the lungs every 6 (six) hours as needed for wheezing or shortness of breath. 08/14/21   Particia Nearing, PA-C  amoxicillin-clavulanate (AUGMENTIN) 875-125 MG tablet Take 1 tablet by mouth every 12 (twelve) hours. Patient not taking: Reported on 09/18/2022 08/18/21   Raspet, Noberto Retort, PA-C  cetirizine (ZYRTEC ALLERGY) 10 MG tablet Take 1 tablet (10 mg total) by mouth daily. Patient not taking:  Reported on 09/18/2022 08/14/21   Particia Nearing, PA-C  etonogestrel (NEXPLANON) 68 MG IMPL implant 68 mg by Subdermal route once.    [provider]  fluticasone (FLONASE) 50 MCG/ACT nasal spray Place 1 spray into both nostrils 2 (two) times daily. Patient not taking: Reported on 09/18/2022 08/14/21   Particia Nearing, PA-C  megestrol (MEGACE) 40 MG tablet 3x5d, 2x5d, then 1 daily to help control vaginal bleeding. Stop taking when bleeding stops. 04/14/21   Cheral Marker, CNM  ondansetron (ZOFRAN ODT) 4 MG disintegrating tablet Take 1 tablet (4 mg total) by mouth every 8 (eight) hours as needed for nausea or vomiting. Patient not taking: Reported on 09/18/2022 07/19/21   Roemhildt, Lorin T, PA-C  promethazine-dextromethorphan (PROMETHAZINE-DM) 6.25-15 MG/5ML syrup Take 5 mLs by mouth 4 (four) times daily as needed for cough. Patient not taking: Reported on 09/18/2022 08/14/21   Particia Nearing, PA-C    Family History Family History  Problem Relation Age of Onset   Cancer Paternal Grandmother        breast   Diabetes Paternal Grandmother    Stroke Paternal Grandmother    Hyperlipidemia Paternal Grandmother    Hypertension Maternal Grandfather    Diabetes Father  Diabetes Sister    Cancer Other    Diabetes Maternal Aunt    Cancer Maternal Aunt        breast   Cancer Paternal Uncle        prostate, bone   Diabetes Paternal Uncle     Social History Social History   Tobacco Use   Smoking status: Some Days    Packs/day: 0    Types: Cigarettes   Smokeless tobacco: Never  Vaping Use   Vaping Use: Every day  Substance Use Topics   Alcohol use: Not Currently    Comment: occ   Drug use: Yes    Types: Marijuana    Comment: daily     Allergies   Cherry and Peanut-containing drug products   Review of Systems Review of Systems Per HPI  Physical Exam Triage Vital Signs ED Triage Vitals  Enc Vitals Group     BP 03/23/23 1604 118/77      Pulse Rate 03/23/23 1604 77     Resp 03/23/23 1604 15     Temp 03/23/23 1604 98.7 F (37.1 C)     Temp Source 03/23/23 1604 Oral     SpO2 03/23/23 1604 95 %     Weight --      Height --      Head Circumference --      Peak Flow --      Pain Score 03/23/23 1605 0     Pain Loc --      Pain Edu? --      Excl. in GC? --    No data found.  Updated Vital Signs BP 118/77 (BP Location: Right Arm)   Pulse 77   Temp 98.7 F (37.1 C) (Oral)   Resp 15   SpO2 95%   Visual Acuity Right Eye Distance:   Left Eye Distance:   Bilateral Distance:    Right Eye Near:   Left Eye Near:    Bilateral Near:     Physical Exam Vitals and nursing note reviewed.  Constitutional:      Appearance: Normal appearance. She is not toxic-appearing.  HENT:     Head: Normocephalic.  Eyes:     Extraocular Movements: Extraocular movements intact.     Pupils: Pupils are equal, round, and reactive to light.  Pulmonary:     Effort: Pulmonary effort is normal.  Musculoskeletal:     Cervical back: Normal range of motion.  Lymphadenopathy:     Cervical: No cervical adenopathy.  Skin:    General: Skin is warm and dry.     Findings: Laceration present.     Comments: 1.5 cm linear laceration noted above the left eyebrow.  Edges are clean.  Bleeding is controlled at this time.  Neurological:     General: No focal deficit present.     Mental Status: She is alert and oriented to person, place, and time.     GCS: GCS eye subscore is 4. GCS verbal subscore is 5. GCS motor subscore is 6.     Cranial Nerves: Cranial nerves 2-12 are intact.     Sensory: Sensation is intact.     Motor: Motor function is intact.     Coordination: Coordination is intact.     Gait: Gait is intact.  Psychiatric:        Mood and Affect: Mood normal.        Behavior: Behavior normal.      UC Treatments / Results  Labs (all labs  ordered are listed, but only abnormal results are displayed) Labs Reviewed - No data to  display  EKG   Radiology No results found.  Procedures Laceration Repair  Date/Time: 03/23/2023 4:49 PM  Performed by: Abran Cantor, NP Authorized by: Abran Cantor, NP   Consent:    Consent obtained:  Verbal   Consent given by:  Patient   Risks discussed:  Infection, need for additional repair and poor cosmetic result   Alternatives discussed:  No treatment Universal protocol:    Procedure explained and questions answered to patient or proxy's satisfaction: yes     Patient identity confirmed:  Verbally with patient Anesthesia:    Anesthesia method:  Local infiltration   Local anesthetic:  Lidocaine 2% w/o epi Laceration details:    Location:  Face   Face location:  L eyebrow   Length (cm):  1.5 Exploration:    Imaging outcome: foreign body not noted     Contaminated: no   Treatment:    Wound cleansed with: Hibiclens soap and normal saline.   Amount of cleaning:  Standard   Irrigation solution:  Tap water   Irrigation method:  Tap Skin repair:    Repair method:  Sutures   Suture size:  4-0   Suture material:  Prolene   Suture technique:  Simple interrupted   Number of sutures:  3 Approximation:    Approximation:  Close Repair type:    Repair type:  Simple Post-procedure details:    Dressing:  Antibiotic ointment and adhesive bandage   Procedure completion:  Tolerated well, no immediate complications Comments:     1.5 cm laceration above the left eyebrow.  Area was cleaned with Hibiclens soap and normal saline. 2mL of Lidocaine 2% without epi was used for anesthesia.  Laceration was repaired with 4.0 Prolene.  3 simple interrupted sutures were placed with complete closure of the laceration.  Area was cleansed with normal saline after after the procedure, patient tolerated well.  Antibiotic ointment with nonadhesive bandage was applied.  (including critical care time)  Medications Ordered in UC Medications - No data to display  Initial  Impression / Assessment and Plan / UC Course  I have reviewed the triage vital signs and the nursing notes.  Pertinent labs & imaging results that were available during my care of the patient were reviewed by me and considered in my medical decision making (see chart for details).  1.5 cm laceration above the left eyebrow was repaired with 3 simple interrupted sutures.  Patient tolerated the procedure well.  After the procedure, area was cleaned with normal saline, with bacitracin and adhesive bandage.  Patient's Tdap was performed last in 2021.  Patient tolerated the procedure well.  Will have patient follow-up in this clinic in 7 days for suture removal.  Supportive care recommendations were provided and discussed with the patient to include use of Tylenol for pain or discomfort, application of ice to the area to help with pain and swelling, and cleansing the area daily.  Patient was provided signs of infection to include swelling, redness, or drainage to the site.  Patient verbalizes understanding, and is in agreement with this plan of care.  All questions were answered.  Patient stable for discharge.   Final Clinical Impressions(s) / UC Diagnoses   Final diagnoses:  Facial laceration, initial encounter     Discharge Instructions      3 sutures were placed to the laceration on your forehead.  Keep the bandage in  place until the next morning.  Cleanse the area with warm water and cover with Neosporin and a bandage. May take over-the-counter Tylenol as needed for pain or discomfort. Apply ice to the left forehead to help with swelling and pain.  Apply for 20 minutes, remove for 1 hour, then repeat as needed.  Recommend doing this as much as possible for the next 24 hours. Monitor the area for signs of infection to include swelling, redness, or foul-smelling drainage to the site.  If you notice any of the symptoms, please follow-up in this clinic immediately for further evaluation. This is can  be removed in 7 days.  You can follow-up in this on 03/30/2023 for suture removal. Follow-up as needed.     ED Prescriptions   None    PDMP not reviewed this encounter.   Abran Cantor, NP 03/23/23 1657

## 2023-03-30 ENCOUNTER — Ambulatory Visit
Admission: EM | Admit: 2023-03-30 | Discharge: 2023-03-30 | Disposition: A | Discharge status: Home / Self Care | Payer: Managed Care, Other (non HMO)

## 2023-03-30 NOTE — ED Notes (Signed)
Called pt from the waiting room 2 times and via phone 2 times. Pt did not answer none of the 4 attempts.

## 2023-08-25 ENCOUNTER — Ambulatory Visit (INDEPENDENT_AMBULATORY_CARE_PROVIDER_SITE_OTHER): Payer: Managed Care, Other (non HMO) | Admitting: Women's Health

## 2023-08-25 ENCOUNTER — Encounter: Payer: Self-pay | Admitting: Women's Health

## 2023-08-25 VITALS — BP 114/69 | HR 95 | Ht 63.0 in | Wt 179.0 lb

## 2023-08-25 DIAGNOSIS — Z3046 Encounter for surveillance of implantable subdermal contraceptive: Secondary | ICD-10-CM

## 2023-08-25 NOTE — Patient Instructions (Signed)
Keep the area clean and dry.  You can remove the big bandage in 24 hours, and the small steri-strip bandage in 3-5 days.  

## 2023-08-25 NOTE — Progress Notes (Signed)
   NEXPLANON REMOVAL Patient name: Lauren Sherman MRN [id number]  Date of birth: 29-May-1996 Subjective Findings:   Lauren Sherman is a 27 y.o. G73P1001 female being seen today for removal of a Nexplanon. Her Nexplanon was placed 07/04/20.  She desires removal because it's time and just wants to start having periods again. Signed copy of informed consent in chart.   No LMP recorded. Patient has had an implant. Last pap11/17/23. Results were: NILM w/ HRHPV negative The planned method of family planning is abstinence, not in a relationship     09/18/2022   11:54 AM 03/19/2021    1:54 PM 02/28/2020    9:47 AM 11/13/2019   10:16 AM 12/07/2017    1:44 PM  Depression screen PHQ 2/9  Decreased Interest 1 1 0 1 0  Down, Depressed, Hopeless 0 2 0 1 0  PHQ - 2 Score 1 3 0 2 0  Altered sleeping 1 3 0 1   Tired, decreased energy 1 2 0 1   Change in appetite 1 1 0 0   Feeling bad or failure about yourself  0 3 0 0   Trouble concentrating 0 0 0 0   Moving slowly or fidgety/restless 0 3 0 0   Suicidal thoughts 0 0 0 0   PHQ-9 Score 4 15 0 4         09/18/2022   11:55 AM 03/19/2021    1:57 PM 02/28/2020    9:47 AM  GAD 7 : Generalized Anxiety Score  Nervous, Anxious, on Edge 1 3 0  Control/stop worrying 0 3 0  Worry too much - different things 0 3 1  Trouble relaxing 0 3 0  Restless 3 3 0  Easily annoyed or irritable 1 2 0  Afraid - awful might happen 0 1 0  Total GAD 7 Score 5 18 1   Anxiety Difficulty   Not difficult at all     Pertinent History Reviewed:   Reviewed past medical,surgical, social, obstetrical and family history.  Reviewed problem list, medications and allergies. Objective Findings & Procedure:    Vitals:   08/25/23 1353  BP: 114/69  Pulse: 95  Weight: 179 lb (81.2 kg)  Height: 5\' 3"  (1.6 m)  Body mass index is 31.71 kg/m.  No results found for this or any previous visit (from the past 24 hour(s)).   Time out was performed. Removal by Everlena Cooper, SNM Nexplanon  site identified.  Area prepped in usual sterile fashon. One cc of 2% lidocaine was used to anesthetize the area at the distal end of the implant. A small stab incision was made right beside the implant on the distal portion.  The Nexplanon rod was grasped using hemostats and removed without difficulty.  There was less than 3 cc blood loss. There were no complications.  Steri-strips were applied over the small incision and a pressure bandage was applied.  The patient tolerated the procedure well. Assessment & Plan:   1) Nexplanon removal She was instructed to keep the area clean and dry, remove pressure bandage in 24 hours, and keep insertion site covered with the steri-strip for 3-5 days.   Follow-up PRN problems.  No orders of the defined types were placed in this encounter.   Follow-up: Return in about 1 week (around 09/01/2023) for Physical.  Cheral Marker CNM, WHNP-BC 08/25/2023 2:25 PM

## 2023-08-26 ENCOUNTER — Other Ambulatory Visit: Payer: Self-pay | Admitting: Women's Health

## 2023-08-26 ENCOUNTER — Encounter: Payer: Self-pay | Admitting: Women's Health

## 2023-09-02 ENCOUNTER — Encounter: Payer: Self-pay | Admitting: Obstetrics & Gynecology

## 2023-09-02 ENCOUNTER — Ambulatory Visit (INDEPENDENT_AMBULATORY_CARE_PROVIDER_SITE_OTHER): Payer: Managed Care, Other (non HMO) | Admitting: Obstetrics & Gynecology

## 2023-09-02 VITALS — BP 110/78 | HR 102 | Ht 63.0 in | Wt 175.0 lb

## 2023-09-02 DIAGNOSIS — Z01419 Encounter for gynecological examination (general) (routine) without abnormal findings: Secondary | ICD-10-CM

## 2023-09-02 NOTE — Progress Notes (Signed)
WELL-WOMAN EXAMINATION Patient name: Lauren Sherman MRN [id number]  Date of birth: 08/29/1996 Chief Complaint:   Annual Exam  History of Present Illness:   Lauren Sherman is a 27 y.o. G57P1001 female being seen today for a routine well-woman exam.   Not currently sexually active.  Nexplanon out 08/25/23- no period since removal.    Today she notes no acute complaints or concerns.   No LMP recorded (lmp unknown). (Menstrual status: Other). Denies issues with her menses The current method of family planning is abstinence.    Last pap 2023.  Last mammogram: NA. Last colonoscopy: NA     09/02/2023    2:30 PM 09/18/2022   11:54 AM 03/19/2021    1:54 PM 02/28/2020    9:47 AM 11/13/2019   10:16 AM  Depression screen PHQ 2/9  Decreased Interest 1 1 1  0 1  Down, Depressed, Hopeless 0 0 2 0 1  PHQ - 2 Score 1 1 3  0 2  Altered sleeping 0 1 3 0 1  Tired, decreased energy 3 1 2  0 1  Change in appetite 3 1 1  0 0  Feeling bad or failure about yourself  0 0 3 0 0  Trouble concentrating 0 0 0 0 0  Moving slowly or fidgety/restless 0 0 3 0 0  Suicidal thoughts 0 0 0 0 0  PHQ-9 Score 7 4 15  0 4      Review of Systems:   Pertinent items are noted in HPI Denies any headaches, blurred vision, fatigue, shortness of breath, chest pain, abdominal pain, bowel movements, urination, or intercourse unless otherwise stated above.  Pertinent History Reviewed:  Reviewed past medical,surgical, social and family history.  Reviewed problem list, medications and allergies. Physical Assessment:   Vitals:   09/02/23 1432  BP: 110/78  Pulse: (!) 102  Weight: 175 lb (79.4 kg)  Height: 5\' 3"  (1.6 m)  Body mass index is 31 kg/m.        Physical Examination:   General appearance - well appearing, and in no distress  Mental status - alert, oriented to person, place, and time  Psych:  She has a normal mood and affect  Skin - warm and dry, normal color, no suspicious lesions noted  Chest - effort  normal, all lung fields clear to auscultation bilaterally  Heart - normal rate and regular rhythm  Neck:  midline trachea, no thyromegaly or nodules  Breasts - breasts appear normal, no suspicious masses, no skin or nipple changes or  axillary nodes  Abdomen - soft, nontender, nondistended, no masses or organomegaly  Pelvic - VULVA: normal appearing vulva with no masses, tenderness or lesions  VAGINA: normal appearing vagina with normal color and discharge, no lesions  CERVIX: normal appearing cervix without discharge or lesions, no CMT  UTERUS: uterus is felt to be normal size, shape, consistency and nontender   ADNEXA: No adnexal masses or tenderness noted.  Extremities:  No swelling or varicosities noted  Chaperone:  pt declined      Assessment & Plan:  1) Well-Woman Exam -pap up to date, reviewed screening guidelines -declined STI screening  2) Contraceptive management -briefly reviewed her options including permanent sterilization -for now she is ok with abstinence  No orders of the defined types were placed in this encounter.   Meds: No orders of the defined types were placed in this encounter.   Follow-up: Return in about 1 year (around 09/01/2024) for Annual.   Myna Hidalgo, DO  Attending Obstetrician & Gynecologist, [facility] for [employer], [facility]

## 2023-11-25 ENCOUNTER — Encounter: Payer: Self-pay | Admitting: Adult Health

## 2023-11-25 ENCOUNTER — Ambulatory Visit: Payer: Self-pay | Admitting: Adult Health

## 2023-11-25 VITALS — BP 104/69 | HR 62 | Ht 63.0 in | Wt 178.0 lb

## 2023-11-25 DIAGNOSIS — Z3202 Encounter for pregnancy test, result negative: Secondary | ICD-10-CM | POA: Diagnosis not present

## 2023-11-25 DIAGNOSIS — Z3009 Encounter for other general counseling and advice on contraception: Secondary | ICD-10-CM

## 2023-11-25 LAB — POCT URINE PREGNANCY: Preg Test, Ur: NEGATIVE

## 2023-11-25 NOTE — Progress Notes (Signed)
  Subjective:     Patient ID: Lauren Sherman, female   DOB: 09/08/1996, 28 y.o.   MRN: [id number]  HPI Lauren Sherman is a 28 year old female,single, G1P1001 in to discuss birth control options, she has had nexplanon in the past.     Component Value Date/Time   DIAGPAP  09/18/2022 1218    - Negative for intraepithelial lesion or malignancy (NILM)   DIAGPAP  12/07/2017 0000    NEGATIVE FOR INTRAEPITHELIAL LESIONS OR MALIGNANCY.   HPVHIGH Negative 09/18/2022 1218   ADEQPAP  09/18/2022 1218    Satisfactory for evaluation; transformation zone component PRESENT.   ADEQPAP  12/07/2017 0000    Satisfactory for evaluation  endocervical/transformation zone component PRESENT.    Review of Systems Periods regular No sex in 3 years Reviewed past medical,surgical, social and family history. Reviewed medications and allergies.     Objective:   Physical Exam BP 104/69 (BP Location: Left Arm, Patient Position: Sitting, Cuff Size: Normal)   Pulse 62   Ht 5\' 3"  (1.6 m)   Wt 178 lb (80.7 kg)   LMP 10/25/2023 (Approximate)   BMI 31.53 kg/m  UPT is negative    Skin warm and dry.  Lungs: clear to ausculation bilaterally. Cardiovascular: regular rate and rhythm.  Fall risk is low  Upstream - 11/25/23 0901       Pregnancy Intention Screening   Does the patient want to become pregnant in the next year? No    Does the patient's partner want to become pregnant in the next year? No    Would the patient like to discuss contraceptive options today? Yes      Contraception Wrap Up   Current Method Abstinence   to get nexplanon in am   End Method Abstinence    Contraception Counseling Provided Yes             Assessment:     1. Pregnancy examination or test, negative result  - POCT urine pregnancy  2. General counseling and advice for contraceptive management (Primary) Discussed options wants nexplanon, aware of pros and cons Handout given on nexplanon    Plan:     Return in 1 day for nexplanon  insertion

## 2023-11-26 ENCOUNTER — Encounter: Payer: BC Managed Care – PPO | Admitting: Adult Health

## 2023-12-02 ENCOUNTER — Ambulatory Visit (INDEPENDENT_AMBULATORY_CARE_PROVIDER_SITE_OTHER): Payer: BC Managed Care – PPO | Admitting: Adult Health

## 2023-12-02 ENCOUNTER — Encounter: Payer: Self-pay | Admitting: Adult Health

## 2023-12-02 VITALS — BP 107/68 | HR 61 | Ht 63.0 in | Wt 178.0 lb

## 2023-12-02 DIAGNOSIS — Z30017 Encounter for initial prescription of implantable subdermal contraceptive: Secondary | ICD-10-CM

## 2023-12-02 DIAGNOSIS — Z3202 Encounter for pregnancy test, result negative: Secondary | ICD-10-CM | POA: Diagnosis not present

## 2023-12-02 LAB — POCT URINE PREGNANCY: Preg Test, Ur: NEGATIVE

## 2023-12-02 MED ORDER — ETONOGESTREL 68 MG ~~LOC~~ IMPL
68.0000 mg | DRUG_IMPLANT | Freq: Once | SUBCUTANEOUS | Status: AC
  Administered 2023-12-02: 68 mg via SUBCUTANEOUS

## 2023-12-02 NOTE — Progress Notes (Signed)
  Subjective:     Patient ID: Lauren Sherman, female   DOB: 04/01/1996, 28 y.o.   MRN: [id number]  HPI Lauren Sherman is a 28 year old female, single, G1P1001, in for nexplanon insertion. Last sex about 3 years ago.     Component Value Date/Time   DIAGPAP  09/18/2022 1218    - Negative for intraepithelial lesion or malignancy (NILM)   DIAGPAP  12/07/2017 0000    NEGATIVE FOR INTRAEPITHELIAL LESIONS OR MALIGNANCY.   HPVHIGH Negative 09/18/2022 1218   ADEQPAP  09/18/2022 1218    Satisfactory for evaluation; transformation zone component PRESENT.   ADEQPAP  12/07/2017 0000    Satisfactory for evaluation  endocervical/transformation zone component PRESENT.    Review of Systems For nexplanon insertion Reviewed past medical,surgical, social and family history. Reviewed medications and allergies.     Objective:   Physical Exam BP 107/68 (BP Location: Left Arm, Patient Position: Sitting, Cuff Size: Normal)   Pulse 61   Ht 5\' 3"  (1.6 m)   Wt 178 lb (80.7 kg)   LMP 11/27/2023 (Approximate)   BMI 31.53 kg/m  UPT is negative  Consent signed, time out called. Left arm cleansed with betadine, and injected with 1.5 cc 2% lidocaine and waited til numb. Nexplanon easily inserted and steri strips applied.Rod easily palpated by provider and pt. Pressure dressing applied.   Upstream - 12/02/23 1112       Pregnancy Intention Screening   Does the patient want to become pregnant in the next year? No    Does the patient's partner want to become pregnant in the next year? No    Would the patient like to discuss contraceptive options today? No      Contraception Wrap Up   Current Method Abstinence    End Method Hormonal Implant    Contraception Counseling Provided Yes                Assessment:     1. Pregnancy examination or test, negative resul - POCT urine pregnancy  2. Nexplanon insertion (Primary) Placed left arm Lot [id number] 2027-02 - etonogestrel (NEXPLANON) implant 68 mg    Use  condoms x 2 weeks, keep clean and dry x 24 hours, no heavy lifting, keep steri strips on x 72 hours, Keep pressure dressing on x 24 hours. Follow up prn problems.  Plan:     Remove nexplanon in 3 years or sooner if desired

## 2023-12-27 ENCOUNTER — Other Ambulatory Visit: Payer: Self-pay | Admitting: Adult Health

## 2023-12-27 MED ORDER — MEGESTROL ACETATE 40 MG PO TABS: ORAL_TABLET | ORAL | 1 refills | Status: DC

## 2023-12-27 NOTE — Progress Notes (Signed)
Rx megace??  

## 2024-02-09 ENCOUNTER — Other Ambulatory Visit: Payer: Self-pay | Admitting: Adult Health

## 2024-02-09 MED ORDER — MEGESTROL ACETATE 40 MG PO TABS: ORAL_TABLET | ORAL | 1 refills | Status: DC

## 2024-05-16 ENCOUNTER — Other Ambulatory Visit: Payer: Self-pay | Admitting: Adult Health

## 2024-08-02 ENCOUNTER — Ambulatory Visit
Admission: EM | Admit: 2024-08-02 | Discharge: 2024-08-02 | Disposition: A | Discharge status: Home / Self Care | Attending: Nurse Practitioner | Admitting: Nurse Practitioner

## 2024-08-02 ENCOUNTER — Encounter: Payer: Self-pay | Admitting: Emergency Medicine

## 2024-08-02 ENCOUNTER — Other Ambulatory Visit: Payer: Self-pay

## 2024-08-02 DIAGNOSIS — R0989 Other specified symptoms and signs involving the circulatory and respiratory systems: Secondary | ICD-10-CM | POA: Insufficient documentation

## 2024-08-02 DIAGNOSIS — J029 Acute pharyngitis, unspecified: Secondary | ICD-10-CM | POA: Diagnosis present

## 2024-08-02 DIAGNOSIS — J069 Acute upper respiratory infection, unspecified: Secondary | ICD-10-CM | POA: Diagnosis present

## 2024-08-02 LAB — POC SOFIA SARS ANTIGEN FIA: SARS Coronavirus 2 Ag: NEGATIVE

## 2024-08-02 LAB — POCT RAPID STREP A (OFFICE): Rapid Strep A Screen: NEGATIVE

## 2024-08-02 NOTE — Discharge Instructions (Addendum)
 The rapid strep test and COVID test were negative.  A throat culture has been ordered.  You will be contacted if the pending test result is abnormal.  You will also have access to the results via MyChart. You may continue over-the-counter Tylenol  as needed for pain, fever, or general discomfort. Recommend warm salt water gargles 3-4 times daily as needed for throat pain or discomfort.  You may also use over-the-counter Chloraseptic throat spray or throat lozenges while symptoms persist. For your cough, you may find it helpful to use a humidifier at nighttime during sleep and to sleep elevated on pillows while symptoms persist. Symptoms should begin to improve within the next 5 to 7 days.  If symptoms fail to improve, or begin to worsen, you may follow-up in this clinic or with your primary care physician for further evaluation. Follow-up as needed.

## 2024-08-02 NOTE — ED Triage Notes (Addendum)
 Pt reports sore throat since Saturday, productive cough since Monday, headache since last night. Denies any known fevers. Has taken otc medications and used herbal remedies with no improvement of symptoms. Last dose of tylenol  10 pm last night.

## 2024-08-02 NOTE — ED Provider Notes (Signed)
 [facility]    CSN: [id number] Arrival date & time: 08/02/24  1124      History   Chief Complaint Chief Complaint  Patient presents with   Cough    HPI Lauren Sherman is a 28 y.o. female.   The history is provided by the patient.   Patient presents with a 4-day history of sore throat, cough, and headache.  She denies fever, chills, ear pain, ear drainage, wheezing, difficulty breathing, chest pain, abdominal pain, nausea, vomiting, diarrhea, or rash.  Patient states that she was around her grandmother who was sick, but states that was a short exposure.  She denies any other obvious close sick contacts.  So far she has been taking over-the-counter medications and using herbal remedies for her symptoms.  Past Medical History:  Diagnosis Date   BV (bacterial vaginosis) 07/13/2016   Chlamydia infection 07/13/2016   Encounter for education about contraceptive use 12/17/2015   Gonorrhea    Migraines    Nexplanon  insertion 01/22/2016   Inserted left arm 01/22/16    Patient Active Problem List   Diagnosis Date Noted   General counseling and advice for contraceptive management 11/25/2023   Pregnancy examination or test, negative result 11/25/2023   Depression 03/19/2021   Nexplanon  insertion 07/04/2020   Marijuana user 12/04/2019    Past Surgical History:  Procedure Laterality Date   NO PAST SURGERIES      OB History     Gravida  1   Para  1   Term  1   Preterm  0   AB  0   Living  1      SAB  0   IAB  0   Ectopic  0   Multiple  0   Live Births  1            Home Medications    Prior to Admission medications   Medication Sig Start Date End Date Taking? Authorizing Provider  megestrol  (MEGACE ) 40 MG tablet TAKE 3 TABLETS FOR 5 DAYS THEN 2 X 5 DAYS THEN 1 DAILY TILL BLEEDING STOPS 05/16/24   Signa Delon LABOR, NP    Family History Family History  Problem Relation Age of Onset   Cancer Paternal Grandmother        breast    Diabetes Paternal Grandmother    Stroke Paternal Grandmother    Hyperlipidemia Paternal Grandmother    Hypertension Maternal Grandfather    Diabetes Father    Diabetes Sister    Cancer Other    Diabetes Maternal Aunt    Cancer Maternal Aunt        breast   Cancer Paternal Uncle        prostate, bone   Diabetes Paternal Uncle     Social History Social History   Tobacco Use   Smoking status: Former    Types: Cigarettes   Smokeless tobacco: Never  Vaping Use   Vaping status: Former  Substance Use Topics   Alcohol use: Yes    Comment: occ   Drug use: Yes    Types: Marijuana    Comment: daily     Allergies   Patient has no known allergies.   Review of Systems Review of Systems Per HPI  Physical Exam Triage Vital Signs ED Triage Vitals  Encounter Vitals Group     BP 08/02/24 1149 113/80     Girls Systolic BP Percentile --      Girls Diastolic BP Percentile --  Boys Systolic BP Percentile --      Boys Diastolic BP Percentile --      Pulse Rate 08/02/24 1149 90     Resp 08/02/24 1149 20     Temp 08/02/24 1149 98.4 F (36.9 C)     Temp Source 08/02/24 1149 Oral     SpO2 08/02/24 1149 97 %     Weight --      Height --      Head Circumference --      Peak Flow --      Pain Score 08/02/24 1147 8     Pain Loc --      Pain Education --      Exclude from Growth Chart --    No data found.  Updated Vital Signs BP 113/80 (BP Location: Right Arm)   Pulse 90   Temp 98.4 F (36.9 C) (Oral)   Resp 20   SpO2 97%   Visual Acuity Right Eye Distance:   Left Eye Distance:   Bilateral Distance:    Right Eye Near:   Left Eye Near:    Bilateral Near:     Physical Exam Vitals and nursing note reviewed.  Constitutional:      General: She is not in acute distress.    Appearance: Normal appearance. She is well-developed.  HENT:     Head: Normocephalic and atraumatic.     Right Ear: Tympanic membrane, ear canal and external ear normal.     Left Ear:  Tympanic membrane, ear canal and external ear normal.     Nose: Congestion present.     Right Turbinates: Enlarged and swollen.     Left Turbinates: Enlarged and swollen.     Right Sinus: No maxillary sinus tenderness or frontal sinus tenderness.     Left Sinus: No maxillary sinus tenderness or frontal sinus tenderness.     Mouth/Throat:     Lips: Pink.     Mouth: Mucous membranes are moist.     Pharynx: Uvula midline. Posterior oropharyngeal erythema and postnasal drip present. No pharyngeal swelling, oropharyngeal exudate or uvula swelling.     Comments: Cobblestoning present to posterior oropharynx  Eyes:     Extraocular Movements: Extraocular movements intact.     Conjunctiva/sclera: Conjunctivae normal.     Pupils: Pupils are equal, round, and reactive to light.  Neck:     Thyroid : No thyromegaly.     Trachea: No tracheal deviation.  Cardiovascular:     Rate and Rhythm: Normal rate and regular rhythm.     Pulses: Normal pulses.     Heart sounds: Normal heart sounds.  Pulmonary:     Effort: Pulmonary effort is normal. No respiratory distress.     Breath sounds: Normal breath sounds. No stridor. No wheezing, rhonchi or rales.  Abdominal:     General: Bowel sounds are normal.     Palpations: Abdomen is soft.     Tenderness: There is no abdominal tenderness.  Musculoskeletal:     Cervical back: Normal range of motion and neck supple.  Skin:    General: Skin is warm and dry.  Neurological:     General: No focal deficit present.     Mental Status: She is alert and oriented to person, place, and time.  Psychiatric:        Mood and Affect: Mood normal.        Behavior: Behavior normal.        Thought Content: Thought content normal.  Judgment: Judgment normal.      UC Treatments / Results  Labs (all labs ordered are listed, but only abnormal results are displayed) Labs Reviewed  CULTURE, GROUP A STREP ([facility])  POC SOFIA SARS ANTIGEN FIA  POCT RAPID STREP A  (OFFICE)    EKG   Radiology No results found.  Procedures Procedures (including critical care time)  Medications Ordered in UC Medications - No data to display  Initial Impression / Assessment and Plan / UC Course  I have reviewed the triage vital signs and the nursing notes.  Pertinent labs & imaging results that were available during my care of the patient were reviewed by me and considered in my medical decision making (see chart for details).  The COVID test and rapid strep test were negative.  A throat culture has been ordered.  The patient is well-appearing, she is in no acute distress, vital signs are stable.  Symptoms consistent with viral URI with cough.  Patient would like to continue herbal and symptomatic treatment at this time.  Supportive care recommendations were provided and discussed with the patient to include fluids, rest, warm salt water gargles, and use of a humidifier during sleep.  Discussed indications with patient regarding follow-up.  Patient was in agreement with this plan of care and verbalizes understanding.  All questions were answered.  Patient stable for discharge.  Work note was provided.   Final Clinical Impressions(s) / UC Diagnoses   Final diagnoses:  Sore throat  Symptoms of upper respiratory infection (URI)  Viral URI with cough     Discharge Instructions      The rapid strep test and COVID test were negative.  A throat culture has been ordered.  You will be contacted if the pending test result is abnormal.  You will also have access to the results via MyChart. You may continue over-the-counter Tylenol  as needed for pain, fever, or general discomfort. Recommend warm salt water gargles 3-4 times daily as needed for throat pain or discomfort.  You may also use over-the-counter Chloraseptic throat spray or throat lozenges while symptoms persist. For your cough, you may find it helpful to use a humidifier at nighttime during sleep and to sleep  elevated on pillows while symptoms persist. Symptoms should begin to improve within the next 5 to 7 days.  If symptoms fail to improve, or begin to worsen, you may follow-up in this clinic or with your primary care physician for further evaluation. Follow-up as needed.     ED Prescriptions   None    PDMP not reviewed this encounter.   Gilmer Etta PARAS, NP 08/02/24 1223

## 2024-08-05 LAB — CULTURE, GROUP A STREP (THRC)

## 2024-08-07 ENCOUNTER — Ambulatory Visit (HOSPITAL_COMMUNITY): Payer: Self-pay

## 2024-09-17 ENCOUNTER — Other Ambulatory Visit: Payer: Self-pay | Admitting: Adult Health

## 2024-11-02 ENCOUNTER — Other Ambulatory Visit: Payer: Self-pay

## 2024-11-02 ENCOUNTER — Encounter: Payer: Self-pay | Admitting: Emergency Medicine

## 2024-11-02 ENCOUNTER — Ambulatory Visit (INDEPENDENT_AMBULATORY_CARE_PROVIDER_SITE_OTHER): Payer: Self-pay

## 2024-11-02 ENCOUNTER — Ambulatory Visit
Admission: EM | Admit: 2024-11-02 | Discharge: 2024-11-02 | Disposition: A | Discharge status: Home / Self Care | Payer: Self-pay

## 2024-11-02 DIAGNOSIS — M79672 Pain in left foot: Secondary | ICD-10-CM

## 2024-11-02 NOTE — ED Notes (Signed)
 Called number listed in chart, no answer. Called forwarded to voicemail. Pt not in waiting room either. Pt access also attempted to reach pt.

## 2024-11-02 NOTE — ED Provider Notes (Signed)
 " [facility]    CSN: [id number] Arrival date & time: 11/02/24  1227      History   Chief Complaint Chief Complaint  Patient presents with   Foot Pain    HPI Lauren Sherman is a 29 y.o. female.   Pt reports fell this am and reports left foot pain ever since. Denies any history of surgery to LLE.       Past Medical History:  Diagnosis Date   BV (bacterial vaginosis) 07/13/2016   Chlamydia infection 07/13/2016   Encounter for education about contraceptive use 12/17/2015   Gonorrhea    Migraines    Nexplanon  insertion 01/22/2016   Inserted left arm 01/22/16    Patient Active Problem List   Diagnosis Date Noted   General counseling and advice for contraceptive management 11/25/2023   Pregnancy examination or test, negative result 11/25/2023   Depression 03/19/2021   Nexplanon  insertion 07/04/2020   Marijuana user 12/04/2019    Past Surgical History:  Procedure Laterality Date   NO PAST SURGERIES      OB History     Gravida  1   Para  1   Term  1   Preterm  0   AB  0   Living  1      SAB  0   IAB  0   Ectopic  0   Multiple  0   Live Births  1            Home Medications    Prior to Admission medications  Medication Sig Start Date End Date Taking? Authorizing Provider  tretinoin (RETIN-A) 0.025 % cream Apply topically at bedtime. 06/08/24  Yes [provider]  megestrol  (MEGACE ) 40 MG tablet TAKE 3 TABLETS FOR 5 DAYS, THEN 2 TABS X 5 DAYS, THEN 1 DAILY TILL BLEEDING STOPS 09/18/24   Signa Delon LABOR, NP    Family History Family History  Problem Relation Age of Onset   Cancer Paternal Grandmother        breast   Diabetes Paternal Grandmother    Stroke Paternal Grandmother    Hyperlipidemia Paternal Grandmother    Hypertension Maternal Grandfather    Diabetes Father    Diabetes Sister    Cancer Other    Diabetes Maternal Aunt    Cancer Maternal Aunt        breast   Cancer Paternal Uncle        prostate,  bone   Diabetes Paternal Uncle     Social History Social History[1]   Allergies   Patient has no known allergies.   Review of Systems Review of Systems Per HPI  Physical Exam Triage Vital Signs ED Triage Vitals  Encounter Vitals Group     BP 11/02/24 1340 100/65     Girls Systolic BP Percentile --      Girls Diastolic BP Percentile --      Boys Systolic BP Percentile --      Boys Diastolic BP Percentile --      Pulse Rate 11/02/24 1340 80     Resp 11/02/24 1340 20     Temp 11/02/24 1340 98.7 F (37.1 C)     Temp Source 11/02/24 1340 Oral     SpO2 11/02/24 1340 97 %     Weight --      Height --      Head Circumference --      Peak Flow --      Pain Score  11/02/24 1338 8     Pain Loc --      Pain Education --      Exclude from Growth Chart --    No data found.  Updated Vital Signs BP 100/65 (BP Location: Right Arm)   Pulse 80   Temp 98.7 F (37.1 C) (Oral)   Resp 20   SpO2 97%   Visual Acuity Right Eye Distance:   Left Eye Distance:   Bilateral Distance:    Right Eye Near:   Left Eye Near:    Bilateral Near:     Physical Exam Vitals and nursing note reviewed.  Constitutional:      Appearance: Normal appearance. She is not ill-appearing.  HENT:     Head: Atraumatic.  Eyes:     Extraocular Movements: Extraocular movements intact.     Conjunctiva/sclera: Conjunctivae normal.  Cardiovascular:     Rate and Rhythm: Normal rate.  Pulmonary:     Effort: Pulmonary effort is normal.  Musculoskeletal:        General: Swelling, tenderness and signs of injury present. No deformity. Normal range of motion.     Cervical back: Normal range of motion and neck supple.     Comments: Localized area of edema, bruising, tenderness to palpation to the left lateral left foot dorsally.  No bony deformity palpable  Skin:    General: Skin is warm and dry.     Findings: Bruising present. No erythema.  Neurological:     Mental Status: She is alert and oriented to  person, place, and time.     Comments: Left lower extremity neurovascularly intact  Psychiatric:        Mood and Affect: Mood normal.        Thought Content: Thought content normal.        Judgment: Judgment normal.    UC Treatments / Results  Labs (all labs ordered are listed, but only abnormal results are displayed) Labs Reviewed - No data to display  EKG   Radiology DG Foot Complete Left Result Date: 11/02/2024 EXAM: 3 OR MORE VIEW(S) XRAY OF THE LEFT FOOT 11/02/2024 01:53:28 PM COMPARISON: None available. CLINICAL HISTORY: left foot pain since fall this am FINDINGS: BONES AND JOINTS: No acute fracture. No malalignment. SOFT TISSUES: The soft tissues are unremarkable. IMPRESSION: 1. No acute fracture or dislocation. Electronically signed by: Morgane Naveau MD 11/02/2024 02:19 PM EST RP Workstation: [id number]    Procedures Procedures (including critical care time)  Medications Ordered in UC Medications - No data to display  Initial Impression / Assessment and Plan / UC Course  I have reviewed the triage vital signs and the nursing notes.  Pertinent labs & imaging results that were available during my care of the patient were reviewed by me and considered in my medical decision making (see chart for details).     X-ray of the left foot negative for acute bony abnormality.  Suspect contusion, treat with RICE, over-the-counter pain relievers.  Postop shoe was placed as she is requesting something to help with ambulation.  Return for worsening or unresolving symptoms.  Final Clinical Impressions(s) / UC Diagnoses   Final diagnoses:  Left foot pain     Discharge Instructions      We have fitted you for a postop shoe to see if this helps with your pain with walking.  You may stop using this whenever you feel comfortable.  Ice, elevate, ibuprofen  and Tylenol , rest.  Your x-ray did not show any bony  injury which is great news    ED Prescriptions   None    PDMP not  reviewed this encounter.    [1]  Social History Tobacco Use   Smoking status: Former    Types: Cigarettes   Smokeless tobacco: Never  Vaping Use   Vaping status: Former  Substance Use Topics   Alcohol use: Yes    Comment: occ   Drug use: Yes    Types: Marijuana    Comment: daily     Stuart Vernell Norris, [place] 11/02/24 1454  "

## 2024-11-02 NOTE — ED Triage Notes (Signed)
 Pt reports fell this am and reports left foot pain ever since. Denies any history of surgery to LLE.

## 2024-11-02 NOTE — Discharge Instructions (Signed)
 We have fitted you for a postop shoe to see if this helps with your pain with walking.  You may stop using this whenever you feel comfortable.  Ice, elevate, ibuprofen  and Tylenol , rest.  Your x-ray did not show any bony injury which is great news

## 2024-11-03 ENCOUNTER — Ambulatory Visit: Payer: Self-pay
